# Patient Record
Sex: Male | Born: 1955 | Race: White | Hispanic: No | Marital: Married | Smoking: Never smoker
Health system: Southern US, Community
[De-identification: ages and names within clinical notes are randomized; demographics above are authoritative.]

## PROBLEM LIST (undated history)

## (undated) DIAGNOSIS — G4730 Sleep apnea, unspecified: Secondary | ICD-10-CM

## (undated) DIAGNOSIS — M1990 Unspecified osteoarthritis, unspecified site: Secondary | ICD-10-CM

## (undated) DIAGNOSIS — I719 Aortic aneurysm of unspecified site, without rupture: Secondary | ICD-10-CM

## (undated) DIAGNOSIS — T7840XA Allergy, unspecified, initial encounter: Secondary | ICD-10-CM

## (undated) DIAGNOSIS — K219 Gastro-esophageal reflux disease without esophagitis: Secondary | ICD-10-CM

## (undated) DIAGNOSIS — H269 Unspecified cataract: Secondary | ICD-10-CM

## (undated) DIAGNOSIS — Z9889 Other specified postprocedural states: Secondary | ICD-10-CM

## (undated) DIAGNOSIS — Z8719 Personal history of other diseases of the digestive system: Secondary | ICD-10-CM

## (undated) HISTORY — PX: COLONOSCOPY: SHX174

## (undated) HISTORY — PX: POLYPECTOMY: SHX149

## (undated) HISTORY — DX: Unspecified osteoarthritis, unspecified site: M19.90

## (undated) HISTORY — DX: Unspecified cataract: H26.9

## (undated) HISTORY — DX: Allergy, unspecified, initial encounter: T78.40XA

## (undated) HISTORY — DX: Sleep apnea, unspecified: G47.30

## (undated) HISTORY — PX: CLOSED REDUCTION HAND FRACTURE: SHX973

---

## 1999-05-26 DIAGNOSIS — Z8719 Personal history of other diseases of the digestive system: Secondary | ICD-10-CM

## 1999-05-26 HISTORY — DX: Personal history of other diseases of the digestive system: Z87.19

## 2002-05-25 HISTORY — PX: NISSEN FUNDOPLICATION: SHX2091

## 2018-01-11 DIAGNOSIS — I719 Aortic aneurysm of unspecified site, without rupture: Secondary | ICD-10-CM

## 2018-01-11 DIAGNOSIS — I7121 Aneurysm of the ascending aorta, without rupture: Secondary | ICD-10-CM

## 2018-01-11 DIAGNOSIS — Q2543 Congenital aneurysm of aorta: Secondary | ICD-10-CM

## 2018-01-11 HISTORY — DX: Aneurysm of the ascending aorta, without rupture: I71.21

## 2018-01-11 HISTORY — DX: Aortic aneurysm of unspecified site, without rupture: I71.9

## 2018-01-11 HISTORY — DX: Congenital aneurysm of aorta: Q25.43

## 2018-01-25 ENCOUNTER — Observation Stay (HOSPITAL_COMMUNITY)
Admission: EM | Admit: 2018-01-25 | Discharge: 2018-01-27 | DRG: 287 | Disposition: A | Discharge status: Home / Self Care | Payer: BLUE CROSS/BLUE SHIELD | Attending: Internal Medicine | Admitting: Internal Medicine

## 2018-01-25 ENCOUNTER — Encounter (HOSPITAL_COMMUNITY): Payer: Self-pay | Admitting: Emergency Medicine

## 2018-01-25 ENCOUNTER — Emergency Department (HOSPITAL_COMMUNITY): Payer: BLUE CROSS/BLUE SHIELD

## 2018-01-25 DIAGNOSIS — R079 Chest pain, unspecified: Secondary | ICD-10-CM | POA: Diagnosis present

## 2018-01-25 DIAGNOSIS — Z8249 Family history of ischemic heart disease and other diseases of the circulatory system: Secondary | ICD-10-CM

## 2018-01-25 DIAGNOSIS — G4733 Obstructive sleep apnea (adult) (pediatric): Secondary | ICD-10-CM | POA: Diagnosis present

## 2018-01-25 DIAGNOSIS — Z820 Family history of epilepsy and other diseases of the nervous system: Secondary | ICD-10-CM | POA: Diagnosis not present

## 2018-01-25 DIAGNOSIS — Q2543 Congenital aneurysm of aorta: Secondary | ICD-10-CM | POA: Diagnosis not present

## 2018-01-25 DIAGNOSIS — I4519 Other right bundle-branch block: Secondary | ICD-10-CM | POA: Diagnosis present

## 2018-01-25 DIAGNOSIS — R402252 Coma scale, best verbal response, oriented, at arrival to emergency department: Secondary | ICD-10-CM | POA: Diagnosis present

## 2018-01-25 DIAGNOSIS — K219 Gastro-esophageal reflux disease without esophagitis: Secondary | ICD-10-CM | POA: Diagnosis present

## 2018-01-25 DIAGNOSIS — I25110 Atherosclerotic heart disease of native coronary artery with unstable angina pectoris: Secondary | ICD-10-CM | POA: Diagnosis not present

## 2018-01-25 DIAGNOSIS — R402362 Coma scale, best motor response, obeys commands, at arrival to emergency department: Secondary | ICD-10-CM | POA: Diagnosis present

## 2018-01-25 DIAGNOSIS — I440 Atrioventricular block, first degree: Secondary | ICD-10-CM | POA: Diagnosis not present

## 2018-01-25 DIAGNOSIS — R402142 Coma scale, eyes open, spontaneous, at arrival to emergency department: Secondary | ICD-10-CM | POA: Diagnosis not present

## 2018-01-25 DIAGNOSIS — Z9989 Dependence on other enabling machines and devices: Secondary | ICD-10-CM | POA: Diagnosis not present

## 2018-01-25 DIAGNOSIS — I340 Nonrheumatic mitral (valve) insufficiency: Secondary | ICD-10-CM | POA: Diagnosis not present

## 2018-01-25 DIAGNOSIS — I7121 Aneurysm of the ascending aorta, without rupture: Secondary | ICD-10-CM | POA: Diagnosis present

## 2018-01-25 DIAGNOSIS — I719 Aortic aneurysm of unspecified site, without rupture: Secondary | ICD-10-CM | POA: Diagnosis present

## 2018-01-25 DIAGNOSIS — Z79899 Other long term (current) drug therapy: Secondary | ICD-10-CM | POA: Diagnosis not present

## 2018-01-25 DIAGNOSIS — Z8719 Personal history of other diseases of the digestive system: Secondary | ICD-10-CM

## 2018-01-25 DIAGNOSIS — M109 Gout, unspecified: Secondary | ICD-10-CM | POA: Diagnosis not present

## 2018-01-25 DIAGNOSIS — I712 Thoracic aortic aneurysm, without rupture: Secondary | ICD-10-CM | POA: Diagnosis present

## 2018-01-25 HISTORY — DX: Gastro-esophageal reflux disease without esophagitis: K21.9

## 2018-01-25 HISTORY — DX: Other specified postprocedural states: Z98.890

## 2018-01-25 HISTORY — DX: Aortic aneurysm of unspecified site, without rupture: I71.9

## 2018-01-25 HISTORY — DX: Personal history of other diseases of the digestive system: Z87.19

## 2018-01-25 LAB — CBC
HCT: 49.8 % (ref 39.0–52.0)
Hemoglobin: 16.6 g/dL (ref 13.0–17.0)
MCH: 29.5 pg (ref 26.0–34.0)
MCHC: 33.3 g/dL (ref 30.0–36.0)
MCV: 88.6 fL (ref 78.0–100.0)
PLATELETS: 163 10*3/uL (ref 150–400)
RBC: 5.62 MIL/uL (ref 4.22–5.81)
RDW: 13.3 % (ref 11.5–15.5)
WBC: 7.8 10*3/uL (ref 4.0–10.5)

## 2018-01-25 LAB — BASIC METABOLIC PANEL
ANION GAP: 11 (ref 5–15)
BUN: 13 mg/dL (ref 8–23)
CHLORIDE: 103 mmol/L (ref 98–111)
CO2: 27 mmol/L (ref 22–32)
Calcium: 10.1 mg/dL (ref 8.9–10.3)
Creatinine, Ser: 1.19 mg/dL (ref 0.61–1.24)
GFR calc Af Amer: >60 mL/min (ref >60)
GFR calc non Af Amer: >60 mL/min (ref >60)
Glucose, Bld: 111 mg/dL — ABNORMAL HIGH (ref 70–99)
POTASSIUM: 4.2 mmol/L (ref 3.5–5.1)
SODIUM: 141 mmol/L (ref 135–145)

## 2018-01-25 LAB — I-STAT TROPONIN, ED
Troponin i, poc: 0.02 ng/mL (ref 0.00–0.08)
Troponin i, poc: 0.01 ng/mL (ref 0.00–0.08)

## 2018-01-25 MED ORDER — ASPIRIN 81 MG PO CHEW
324.0000 mg | CHEWABLE_TABLET | Freq: Once | ORAL | Status: AC
  Administered 2018-01-25: 324 mg via ORAL
  Filled 2018-01-25: qty 4

## 2018-01-25 NOTE — ED Triage Notes (Signed)
Patient to ED c/o intermittent chest heaviness/pressure x [redacted] weeks along with dyspnea on exertion. 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 Patient was recently started on metoprolol 25mg  PO bid 2-3 days ago. Denies shortness of breath at this time, resp e/u, skin warm/dry.

## 2018-01-25 NOTE — ED Provider Notes (Signed)
[facility] EMERGENCY DEPARTMENT Provider Note   CSN: [id number] Arrival date & time: 01/25/18  1553     History   Chief Complaint Chief Complaint  Patient presents with  . Chest Pain    HPI Marvin Harvey is a 62 y.o. male.  HPI Patient is coming from [place].  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  He reports his physician had told him that if he needed repair, it would be arranged with tertiary care facility.  Patient has been having exertional chest pain and shortness of breath. His cardiologist planning a future cath. In past 4 weeks increasing frequency and severity of CP and SOB especially with exertion. Last episode today about 2:00pm. Takes daily aspirin, didn't take today.  No lower extremity swelling or calf pain. Past Medical History:  Diagnosis Date  . 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 GERD (gastroesophageal reflux disease)   . History of repair of hiatal hernia 2001    There are no active problems to display for this patient.   History reviewed. No pertinent surgical history.      Home Medications    Prior to Admission medications   Medication Sig Start Date End Date Taking? Authorizing Provider  allopurinol (ZYLOPRIM) 300 MG tablet Take 450 mg by mouth every evening.  11/01/17  Yes [provider]  metoprolol tartrate (LOPRESSOR) 25 MG tablet Take 25 mg by mouth 2 (two) times daily. 01/21/18  Yes [provider]    Family History No family history on file.  Social History Social History   Tobacco Use  . Smoking status: Never Smoker  . Smokeless tobacco: Never Used  Substance Use Topics  . Alcohol use: Never    Frequency: Never  . Drug use: Never     Allergies   Patient has no known allergies.   Review of Systems Review of Systems 10 Systems  reviewed and are negative for acute change except as noted in the HPI.   Physical Exam Updated Vital Signs BP (!) 141/76   Pulse 65   Temp 98.1 F (36.7 C) (Oral)   Resp 18   SpO2 99%   Physical Exam  Constitutional: He is oriented to person, place, and time. He appears well-developed and well-nourished.  HENT:  Head: Normocephalic and atraumatic.  Eyes: Pupils are equal, round, and reactive to light. EOM are normal.  Neck: Neck supple.  Cardiovascular: Normal rate, regular rhythm, normal heart sounds and intact distal pulses.  Pulmonary/Chest: Effort normal and breath sounds normal.  Abdominal: Soft. Bowel sounds are normal. He exhibits no distension. There is no tenderness.  Musculoskeletal: Normal range of motion. He exhibits no edema or tenderness.  Neurological: He is alert and oriented to person, place, and time. He has normal strength. Coordination normal. GCS eye subscore is 4. GCS verbal subscore is 5. GCS motor subscore is 6.  Skin: Skin is warm, dry and intact.  Psychiatric: He has a normal mood and affect.     ED Treatments / Results  Labs (all labs ordered are listed, but only abnormal results are displayed) Labs Reviewed  BASIC METABOLIC PANEL - Abnormal; Notable for the following components:      Result Value   Glucose, Bld 111 (*)    All other components within normal limits  CBC  I-STAT TROPONIN,  ED  I-STAT TROPONIN, ED    EKG EKG Interpretation  Date/Time:  Tuesday January 25 2018 15:56:39 EDT Ventricular Rate:  74 PR Interval:  216 QRS Duration: 108 QT Interval:  376 QTC Calculation: 417 R Axis:   -23 Text Interpretation:  Sinus rhythm with 1st degree A-V block Incomplete right bundle branch block Nonspecific T wave abnormality Abnormal ECG agree. no old comparison Confirmed by Charlesetta Shanks [phone]) on 01/25/2018 9:33:51 PM   Radiology Dg Chest 2 View  Result Date: 01/25/2018 CLINICAL DATA:  Chest heaviness, intermittent pressure over the  last 3 weeks, some shortness of breath and nausea EXAM: CHEST - 2 VIEW COMPARISON:  None. FINDINGS: No active infiltrate or effusion is seen. Mediastinal and hilar contours are unremarkable. The heart may be borderline enlarged. There is a small hiatal hernia noted. No acute bony abnormality is seen. IMPRESSION: 1. No active cardiopulmonary disease. 2. Borderline cardiomegaly. 3. Small hiatal hernia. Electronically Signed   By: Ivar Drape M.D.   On: 01/25/2018 16:26    Procedures Procedures (including critical care time)  Medications Ordered in ED Medications  aspirin chewable tablet 324 mg (has no administration in time range)     Initial Impression / Assessment and Plan / ED Course  I have reviewed the triage vital signs and the nursing notes.  Pertinent labs & imaging results that were available during my care of the patient were reviewed by me and considered in my medical decision making (see chart for details).  Clinical Course as of Jan 26 2216  Tue Jan 25, 2018  2153 Consult to cardiology ordered   [MP]  2158 Consult:Cardiology Fellow   [MP]  2200 Cardiology requests repeat troponin and will consult in ED.   [MP]    Clinical Course User Index [MP] Charlesetta Shanks, MD   Patient has been experiencing exertional chest pain and shortness of breath.  He reports the frequency and severity has increased in the past several weeks.  His medical records are in [place].  EKG has nonspecific ST changes without old comparison available at this time.  First set of cardiac enzymes are negative.  Consult has been placed to cardiology for evaluation in the emergency department for definitive management.  Final Clinical Impressions(s) / ED Diagnoses   Final diagnoses:  Chest pain, unspecified type    ED Discharge Orders    None       Charlesetta Shanks, MD 01/25/18 2218

## 2018-01-25 NOTE — ED Notes (Signed)
ED Provider at bedside. 

## 2018-01-25 NOTE — Consult Note (Signed)
Cardiology Consultation Note    Patient ID: Marvin Harvey, MRN: [id number], DOB/AGE: 1955-10-29 62 y.o. Admit date: 01/25/2018   Date of Consult: 01/25/2018 Primary Physician: No primary care provider on file. Primary Cardiologist: [facility] cardiology  Chief Complaint: Chest Pain Reason for Consultation: Rule out ischemia Requesting MD: Johnney Killian  HPI: Marvin Harvey is a 62 y.o. male with a history of aortic root aneurysm who presents with chest pain.   The patient had chest pain in the morning and has had a known hx of aortic room aneurysm. The patient is seen by [facility] cardiology though and plan was to do ischemic eval at some point. First set of enzymes wnl but EKG has concerning signs including ST elevation in V and V2 though also has RBBB. Chest pain free when contacted.   Past Medical History:  Diagnosis Date  . 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 GERD (gastroesophageal reflux disease)   . History of repair of hiatal hernia 2001      Surgical History: History reviewed. No pertinent surgical history.   Home Meds: Prior to Admission medications   Medication Sig Start Date End Date Taking? Authorizing Provider  allopurinol (ZYLOPRIM) 300 MG tablet Take 450 mg by mouth every evening.  11/01/17  Yes [provider]  metoprolol tartrate (LOPRESSOR) 25 MG tablet Take 25 mg by mouth 2 (two) times daily. 01/21/18  Yes [provider]    Inpatient Medications:  . aspirin  324 mg Oral Once    Allergies: No Known Allergies  Social History   Socioeconomic History  . Marital status: Married    Spouse name: Not on file  . Number of children: Not on file  . Years of education: Not on file  . Highest education level: Not on file  Occupational History  . Not on file  Social Needs  . Financial resource strain: Not on file  . Food insecurity:    Worry: Not on file    Inability: Not on file  . Transportation needs:    Medical: Not  on file    Non-medical: Not on file  Tobacco Use  . Smoking status: Never Smoker  . Smokeless tobacco: Never Used  Substance and Sexual Activity  . Alcohol use: Never    Frequency: Never  . Drug use: Never  . Sexual activity: Not on file  Lifestyle  . Physical activity:    Days per week: Not on file    Minutes per session: Not on file  . Stress: Not on file  Relationships  . Social connections:    Talks on phone: Not on file    Gets together: Not on file    Attends religious service: Not on file    Active member of club or organization: Not on file    Attends meetings of clubs or organizations: Not on file    Relationship status: Not on file  . Intimate partner violence:    Fear of current or ex partner: Not on file    Emotionally abused: Not on file    Physically abused: Not on file    Forced sexual activity: Not on file  Other Topics Concern  . Not on file  Social History Narrative  . Not on file     No family history on file.   Review of Systems: General: negative for chills, fever, night sweats or weight changes.  Cardiovascular: negative for chest pain, edema, orthopnea, palpitations, paroxysmal  nocturnal dyspnea, shortness of breath or dyspnea on exertion Dermatological: negative for rash Respiratory: negative for cough or wheezing Urologic: negative for hematuria Abdominal: negative for nausea, vomiting, diarrhea, bright red blood per rectum, melena, or hematemesis Neurologic: negative for visual changes, syncope, or dizziness All other systems reviewed and are otherwise negative except as noted above.  Labs: No results for input(s): CKTOTAL, CKMB, TROPONINI in the last 72 hours. Lab Results  Component Value Date   WBC 7.8 01/25/2018   HGB 16.6 01/25/2018   HCT 49.8 01/25/2018   MCV 88.6 01/25/2018   PLT 163 01/25/2018    Recent Labs  Lab 01/25/18 1607  NA 141  K 4.2  CL 103  CO2 27  BUN 13  CREATININE 1.19  CALCIUM 10.1  GLUCOSE 111*   No  results found for: CHOL, HDL, LDLCALC, TRIG No results found for: DDIMER  Radiology/Studies:  Dg Chest 2 View  Result Date: 01/25/2018 CLINICAL DATA:  Chest heaviness, intermittent pressure over the last 3 weeks, some shortness of breath and nausea EXAM: CHEST - 2 VIEW COMPARISON:  None. FINDINGS: No active infiltrate or effusion is seen. Mediastinal and hilar contours are unremarkable. The heart may be borderline enlarged. There is a small hiatal hernia noted. No acute bony abnormality is seen. IMPRESSION: 1. No active cardiopulmonary disease. 2. Borderline cardiomegaly. 3. Small hiatal hernia. Electronically Signed   By: Ivar Drape M.D.   On: 01/25/2018 16:26    Wt Readings from Last 3 Encounters:  No data found for Wt     Physical Exam: Blood pressure (!) 141/76, pulse 65, temperature 98.1 F (36.7 C), temperature source Oral, resp. rate 18, SpO2 99 %. There is no height or weight on file to calculate BMI. General: Well developed, well nourished, in no acute distress. Head: Normocephalic, atraumatic, sclera non-icteric, no xanthomas, nares are without discharge.  Neck: Negative for carotid bruits. JVD not elevated. Lungs: Clear bilaterally to auscultation without wheezes, rales, or rhonchi. Breathing is unlabored. Heart: RRR with S1 S2. No murmurs, rubs, or gallops appreciated. Abdomen: Soft, non-tender, non-distended with normoactive bowel sounds. No hepatomegaly. No rebound/guarding. No obvious abdominal masses. Msk:  Strength and tone appear normal for age. Extremities: No clubbing or cyanosis. No edema.  Distal pedal pulses are 2+ and equal bilaterally. Neuro: Alert and oriented X 3. No facial asymmetry. No focal deficit. Moves all extremities spontaneously. Psych:  Responds to questions appropriately with a normal affect.     Assessment and Plan  Diagnostics: -telemetry overnight -TTE in am -LHC on Monday or sooner if symptoms progress -daily ECG -troponin in am to  estimate size of MI Therapeutic: -ASA 325 given, starting 81 daily -Heparin gtt -Atorva 80 -Metoprolol 12.5 BID -will await LHC results prior to initiation of P2Y12 -SL NTG prn chest pain   Signed, Glynda Jaeger, MD 01/25/2018, 10:01 PM

## 2018-01-26 ENCOUNTER — Encounter (HOSPITAL_COMMUNITY)
Admission: EM | Disposition: A | Discharge status: Home / Self Care | Payer: Self-pay | Source: Home / Self Care | Attending: Emergency Medicine

## 2018-01-26 ENCOUNTER — Inpatient Hospital Stay (HOSPITAL_COMMUNITY): Payer: BLUE CROSS/BLUE SHIELD

## 2018-01-26 ENCOUNTER — Other Ambulatory Visit: Payer: Self-pay

## 2018-01-26 ENCOUNTER — Encounter (HOSPITAL_COMMUNITY): Payer: Self-pay | Admitting: Family Medicine

## 2018-01-26 DIAGNOSIS — R079 Chest pain, unspecified: Secondary | ICD-10-CM | POA: Diagnosis present

## 2018-01-26 DIAGNOSIS — I440 Atrioventricular block, first degree: Secondary | ICD-10-CM | POA: Diagnosis not present

## 2018-01-26 DIAGNOSIS — I200 Unstable angina: Secondary | ICD-10-CM

## 2018-01-26 DIAGNOSIS — Q2543 Congenital aneurysm of aorta: Secondary | ICD-10-CM | POA: Diagnosis not present

## 2018-01-26 DIAGNOSIS — I25110 Atherosclerotic heart disease of native coronary artery with unstable angina pectoris: Secondary | ICD-10-CM | POA: Diagnosis not present

## 2018-01-26 DIAGNOSIS — I712 Thoracic aortic aneurysm, without rupture: Secondary | ICD-10-CM | POA: Diagnosis not present

## 2018-01-26 DIAGNOSIS — I719 Aortic aneurysm of unspecified site, without rupture: Secondary | ICD-10-CM | POA: Diagnosis not present

## 2018-01-26 HISTORY — PX: LEFT HEART CATH AND CORS/GRAFTS ANGIOGRAPHY: CATH118250

## 2018-01-26 LAB — ECHOCARDIOGRAM COMPLETE
HEIGHTINCHES: 71 in
Weight: 3323.2 oz

## 2018-01-26 LAB — HIV ANTIBODY (ROUTINE TESTING W REFLEX): HIV SCREEN 4TH GENERATION: NONREACTIVE

## 2018-01-26 LAB — PROTIME-INR
INR: 1.2
Prothrombin Time: 15.1 seconds (ref 11.4–15.2)

## 2018-01-26 LAB — TROPONIN I: Troponin I: <0.03 ng/mL (ref <0.03)

## 2018-01-26 LAB — HEPARIN LEVEL (UNFRACTIONATED): HEPARIN UNFRACTIONATED: 0.88 [IU]/mL — AB (ref 0.30–0.70)

## 2018-01-26 SURGERY — LEFT HEART CATH AND CORS/GRAFTS ANGIOGRAPHY
Anesthesia: LOCAL

## 2018-01-26 MED ORDER — HEPARIN BOLUS VIA INFUSION
4000.0000 [IU] | Freq: Once | INTRAVENOUS | Status: AC
  Administered 2018-01-26: 4000 [IU] via INTRAVENOUS
  Filled 2018-01-26: qty 4000

## 2018-01-26 MED ORDER — ALLOPURINOL 300 MG PO TABS
450.0000 mg | ORAL_TABLET | Freq: Every evening | ORAL | Status: DC
  Administered 2018-01-26: 450 mg via ORAL
  Filled 2018-01-26: qty 2

## 2018-01-26 MED ORDER — ONDANSETRON HCL 4 MG/2ML IJ SOLN: 4.0000 mg | Freq: Four times a day (QID) | INTRAMUSCULAR | Status: DC | PRN

## 2018-01-26 MED ORDER — SODIUM CHLORIDE 0.9% FLUSH: 3.0000 mL | INTRAVENOUS | Status: DC | PRN

## 2018-01-26 MED ORDER — ASPIRIN EC 81 MG PO TBEC
81.0000 mg | DELAYED_RELEASE_TABLET | Freq: Once | ORAL | Status: AC
  Administered 2018-01-26: 81 mg via ORAL
  Filled 2018-01-26: qty 1

## 2018-01-26 MED ORDER — FENTANYL CITRATE (PF) 100 MCG/2ML IJ SOLN
INTRAMUSCULAR | Status: AC
  Filled 2018-01-26: qty 2

## 2018-01-26 MED ORDER — ACETAMINOPHEN 325 MG PO TABS: 650.0000 mg | ORAL_TABLET | ORAL | Status: DC | PRN

## 2018-01-26 MED ORDER — FENTANYL CITRATE (PF) 100 MCG/2ML IJ SOLN
INTRAMUSCULAR | Status: DC | PRN
  Administered 2018-01-26: 25 ug via INTRAVENOUS

## 2018-01-26 MED ORDER — HEPARIN (PORCINE) IN NACL 100-0.45 UNIT/ML-% IJ SOLN
1000.0000 [IU]/h | INTRAMUSCULAR | Status: DC
  Administered 2018-01-26: 1150 [IU]/h via INTRAVENOUS
  Filled 2018-01-26: qty 250

## 2018-01-26 MED ORDER — ASPIRIN EC 81 MG PO TBEC
81.0000 mg | DELAYED_RELEASE_TABLET | Freq: Every day | ORAL | Status: DC
  Administered 2018-01-27: 81 mg via ORAL
  Filled 2018-01-26: qty 1

## 2018-01-26 MED ORDER — HEPARIN (PORCINE) IN NACL 1000-0.9 UT/500ML-% IV SOLN
INTRAVENOUS | Status: AC
  Filled 2018-01-26: qty 1000

## 2018-01-26 MED ORDER — LIDOCAINE HCL (PF) 1 % IJ SOLN
INTRAMUSCULAR | Status: AC
  Filled 2018-01-26: qty 30

## 2018-01-26 MED ORDER — ASPIRIN EC 81 MG PO TBEC: 81.0000 mg | DELAYED_RELEASE_TABLET | Freq: Every day | ORAL | Status: DC

## 2018-01-26 MED ORDER — ASPIRIN 81 MG PO CHEW: 81.0000 mg | CHEWABLE_TABLET | Freq: Every day | ORAL | Status: DC

## 2018-01-26 MED ORDER — ASPIRIN 81 MG PO CHEW: 81.0000 mg | CHEWABLE_TABLET | ORAL | Status: DC

## 2018-01-26 MED ORDER — LIDOCAINE HCL (PF) 1 % IJ SOLN
INTRAMUSCULAR | Status: DC | PRN
  Administered 2018-01-26: 25 mL

## 2018-01-26 MED ORDER — SODIUM CHLORIDE 0.9% FLUSH
3.0000 mL | Freq: Two times a day (BID) | INTRAVENOUS | Status: DC
  Administered 2018-01-26 – 2018-01-27 (×2): 3 mL via INTRAVENOUS

## 2018-01-26 MED ORDER — MIDAZOLAM HCL 2 MG/2ML IJ SOLN
INTRAMUSCULAR | Status: DC | PRN
  Administered 2018-01-26: 1 mg via INTRAVENOUS

## 2018-01-26 MED ORDER — SODIUM CHLORIDE 0.9 % WEIGHT BASED INFUSION: 1.0000 mL/kg/h | INTRAVENOUS | Status: DC

## 2018-01-26 MED ORDER — IOHEXOL 350 MG/ML SOLN
INTRAVENOUS | Status: DC | PRN
  Administered 2018-01-26: 65 mL via INTRA_ARTERIAL

## 2018-01-26 MED ORDER — MORPHINE SULFATE (PF) 2 MG/ML IV SOLN: 2.0000 mg | INTRAVENOUS | Status: DC | PRN

## 2018-01-26 MED ORDER — SODIUM CHLORIDE 0.9 % WEIGHT BASED INFUSION: 3.0000 mL/kg/h | INTRAVENOUS | Status: DC

## 2018-01-26 MED ORDER — SODIUM CHLORIDE 0.9 % IV SOLN: INTRAVENOUS | Status: AC

## 2018-01-26 MED ORDER — SODIUM CHLORIDE 0.9 % IV SOLN: 250.0000 mL | INTRAVENOUS | Status: DC | PRN

## 2018-01-26 MED ORDER — SODIUM CHLORIDE 0.9 % WEIGHT BASED INFUSION
3.0000 mL/kg/h | INTRAVENOUS | Status: DC
  Administered 2018-01-26: 3 mL/kg/h via INTRAVENOUS

## 2018-01-26 MED ORDER — HEPARIN (PORCINE) IN NACL 1000-0.9 UT/500ML-% IV SOLN
INTRAVENOUS | Status: DC | PRN
  Administered 2018-01-26 (×2): 500 mL

## 2018-01-26 MED ORDER — SODIUM CHLORIDE 0.9 % IV SOLN
INTRAVENOUS | Status: AC
  Administered 2018-01-26: 05:00:00 via INTRAVENOUS

## 2018-01-26 MED ORDER — NITROGLYCERIN 0.4 MG SL SUBL: 0.4000 mg | SUBLINGUAL_TABLET | SUBLINGUAL | Status: DC | PRN

## 2018-01-26 MED ORDER — METOPROLOL TARTRATE 12.5 MG HALF TABLET
12.5000 mg | ORAL_TABLET | Freq: Two times a day (BID) | ORAL | Status: DC
  Administered 2018-01-26 – 2018-01-27 (×3): 12.5 mg via ORAL
  Filled 2018-01-26 (×3): qty 1

## 2018-01-26 MED ORDER — MIDAZOLAM HCL 2 MG/2ML IJ SOLN
INTRAMUSCULAR | Status: AC
  Filled 2018-01-26: qty 2

## 2018-01-26 MED ORDER — SODIUM CHLORIDE 0.9% FLUSH: 3.0000 mL | Freq: Two times a day (BID) | INTRAVENOUS | Status: DC

## 2018-01-26 MED ORDER — ATORVASTATIN CALCIUM 80 MG PO TABS
80.0000 mg | ORAL_TABLET | Freq: Every day | ORAL | Status: DC
  Administered 2018-01-26: 80 mg via ORAL
  Filled 2018-01-26 (×3): qty 1

## 2018-01-26 SURGICAL SUPPLY — 9 items
CATH INFINITI 5FR MULTPACK ANG (CATHETERS) ×2 IMPLANT
DEVICE CLOSURE MYNXGRIP 5F (Vascular Products) ×2 IMPLANT
KIT HEART LEFT (KITS) ×2 IMPLANT
PACK CARDIAC CATHETERIZATION (CUSTOM PROCEDURE TRAY) ×2 IMPLANT
SHEATH PINNACLE 5F 10CM (SHEATH) ×2 IMPLANT
SYR MEDRAD MARK V 150ML (SYRINGE) ×2 IMPLANT
TRANSDUCER W/STOPCOCK (MISCELLANEOUS) ×2 IMPLANT
TUBING CIL FLEX 10 FLL-RA (TUBING) ×2 IMPLANT
WIRE EMERALD 3MM-J .035X150CM (WIRE) ×2 IMPLANT

## 2018-01-26 NOTE — Consult Note (Addendum)
Cardiology Consultation:   Patient ID: Marvin Harvey; [id number]; [birth year]   Admit date: 01/25/2018 Date of Consult: 01/26/2018  Primary Care Provider: Sherrilee Gilles, DO Primary Cardiologist: New to [facility] Followed in [place]  Patient Profile:   Marvin Harvey is a 62 y.o. male with a hx of aortic root aneurysm which is followed in [place], h/o mitral regurgitation, OSA on CPAP, family history of CAD, PVD and valvular heart disease (father) as well as history of GERD, hiatal hernia s/p repair and Gout who is being seen today for the evaluation of chest pain at the request of Dr. Broadus John, Internal Medicine.   History of Present Illness:   Marvin Harvey lives in [place] and is a Company secretary. His daughter is a PA and works in Teacher, English as a foreign language in [place]. His cardiac risk factors only include a family history of heart disease. His father had CAD, PVD and valvular heart disease and underwent triple coronary artery bypass + aortic aneurysm repair and valve replacement, in his 11s. His mother had history of atrial fibrillation and TIAs. He denies any personally history of HTN, HLD, diabetes and no tobacco use. His medical problems include GERD and hiatal hernia s/p surgical repair ~15 years ago. He has Gout, on prophylactic treatment with Allopurinol. About 10 years ago, he was diagnosed with OSA and has since been on CPAP therapy and reports full compliance. At the time of diagnosis, he was also noted to have a murmur and was told that he had mitral regurgitation. It appeared that he was also noted to have mild aortic root dilatation and recommendations were made to follow with surveillance echocardiograms every 2 years. His most recent echo showed progression in the size of his aortic root, leading to a chest CT, which was done 2 weeks ago. Pt was told that his aortic root measured 5.2 cm. A TEE was also done last week to better assess the mitral valve. However the patient reports that he was not given any  specific details regarding the result of ultrasound. His cardiologist in [place] recommended a diagnostic LHC, scheduled this coming Friday, however the patient reports that he preferred to have w/u done at [facility].  Pt presented to [facility] ED last night, 01/25/18, with CC of chest pain. Pt notes progressive CP and dyspnea, that has worsened over the last several weeks.  He has had combination of resting CP, nocturnal CP awakening him from his sleep and exertional CP. No postprandial symptoms and does not feel like reflux. CP is located in the left chest but does not radiate. Feels like pressure. Episodes last 5-30 min at a time. Resolves spontaneously.  He also noted exertional dyspnea walking up stairs. No resting dyspnea. Denies LEE. Given his progressive symptoms, he decided to come to [facility] for further evaluation.   POC troponin in the ED negative x 2. Actual lab troponin I negative x 1. CBC and BMP WNL. Admit EKG showed NSR with HR 76 bpm, however repeat EKG this morning shows sinus bradycardia with 1st degree AVB and HR at 46 bpm.  CXR notable for no active cardiopulmonary disease. Borderline cardiomegaly noted as well as a small hiatal hernia. 2D echo pending. BP is well controlled in the 245Y systolic. HR in the upper 40s - mid 50s. He is on BB therapy with low dose metoprolol, 12.5 mg BID. He is currently CP free.   Past Medical History:  Diagnosis Date  . Aortic root aneurysm ([place]) 01/11/2018   5.2cm according to CT scan  .  GERD (gastroesophageal reflux disease)   . History of repair of hiatal hernia 2001    History reviewed. No pertinent surgical history.   Home Medications:  Prior to Admission medications   Medication Sig Start Date End Date Taking? Authorizing Provider  allopurinol (ZYLOPRIM) 300 MG tablet Take 450 mg by mouth every evening.  11/01/17  Yes [provider]  metoprolol tartrate (LOPRESSOR) 25 MG tablet Take 25 mg by mouth 2 (two) times daily. 01/21/18  Yes [provider]    Inpatient Medications: Scheduled Meds: . allopurinol  450 mg Oral QPM  . [START ON 01/27/2018] aspirin EC  81 mg Oral Daily  . atorvastatin  80 mg Oral q1800  . metoprolol tartrate  12.5 mg Oral BID   Continuous Infusions: . sodium chloride 75 mL/hr at 01/26/18 0511  . heparin 1,150 Units/hr (01/26/18 0515)   PRN Meds: acetaminophen, nitroGLYCERIN, ondansetron (ZOFRAN) IV  Allergies:   No Known Allergies  Social History:   Social History   Socioeconomic History  . Marital status: Married    Spouse name: Not on file  . Number of children: Not on file  . Years of education: Not on file  . Highest education level: Not on file  Occupational History  . Not on file  Social Needs  . Financial resource strain: Not on file  . Food insecurity:    Worry: Not on file    Inability: Not on file  . Transportation needs:    Medical: Not on file    Non-medical: Not on file  Tobacco Use  . Smoking status: Never Smoker  . Smokeless tobacco: Never Used  Substance and Sexual Activity  . Alcohol use: Never    Frequency: Never  . Drug use: Never  . Sexual activity: Not on file  Lifestyle  . Physical activity:    Days per week: Not on file    Minutes per session: Not on file  . Stress: Not on file  Relationships  . Social connections:    Talks on phone: Not on file    Gets together: Not on file    Attends religious service: Not on file    Active member of club or organization: Not on file    Attends meetings of clubs or organizations: Not on file    Relationship status: Not on file  . Intimate partner violence:    Fear of current or ex partner: Not on file    Emotionally abused: Not on file    Physically abused: Not on file    Forced sexual activity: Not on file  Other Topics Concern  . Not on file  Social History Narrative  . Not on file    Family History:    Family History  Problem Relation Age of Onset  . Coronary artery disease Father        s/p  CABGx3, AAA repair, Valve replacement  . Atrial fibrillation Mother   . Transient ischemic attack Mother      ROS:  Please see the history of present illness.   All other ROS reviewed and negative.     Physical Exam/Data:   Vitals:   01/26/18 0313 01/26/18 0315 01/26/18 0352 01/26/18 0722  BP: 102/60 102/61 128/87 117/79  Pulse:   (!) 56 (!) 51  Resp:    16  Temp:   97.7 F (36.5 C) 97.6 F (36.4 C)  TempSrc:   Oral Oral  SpO2: 100% 100% 100% 99%  Weight:  94.2 kg   Height:   5\' 11"  (1.803 m)     Intake/Output Summary (Last 24 hours) at 01/26/2018 1033 Last data filed at 01/26/2018 0620 Gross per 24 hour  Intake 137.33 ml  Output -  Net 137.33 ml   Filed Weights   01/26/18 0352  Weight: 94.2 kg   Body mass index is 28.97 kg/m.  General:  Well nourished, well developed, in no acute distress HEENT: normal Lymph: no adenopathy Neck: no JVD Endocrine:  No thryomegaly Vascular: No carotid bruits; FA pulses 2+ bilaterally without bruits  Cardiac:  normal S1, S2; regular rhythm, bradycardia no murmur  Lungs:  clear to auscultation bilaterally, no wheezing, rhonchi or rales  Abd: soft, nontender, no hepatomegaly  Ext: no edema Musculoskeletal:  No deformities, BUE and BLE strength normal and equal Skin: warm and dry  Neuro:  CNs 2-12 intact, no focal abnormalities noted Psych:  Normal affect   EKG:  The EKG was personally reviewed and demonstrates:  Sinus bradycardia w/ 1st degree AVB, 48 bpm Telemetry:  Telemetry was personally reviewed and demonstrates:  Sinus bradycardia  Relevant CV Studies: None  2D Echo pending  Laboratory Data:  Chemistry Recent Labs  Lab 01/25/18 1607  NA 141  K 4.2  CL 103  CO2 27  GLUCOSE 111*  BUN 13  CREATININE 1.19  CALCIUM 10.1  GFRNONAA >60  GFRAA >60  ANIONGAP 11    No results for input(s): PROT, ALBUMIN, AST, ALT, ALKPHOS, BILITOT in the last 168 hours. Hematology Recent Labs  Lab 01/25/18 1607  WBC 7.8  RBC  5.62  HGB 16.6  HCT 49.8  MCV 88.6  MCH 29.5  MCHC 33.3  RDW 13.3  PLT 163   Cardiac Enzymes Recent Labs  Lab 01/26/18 0446  TROPONINI <0.03    Recent Labs  Lab 01/25/18 1617 01/25/18 2232  TROPIPOC 0.02 0.01    BNPNo results for input(s): BNP, PROBNP in the last 168 hours.  DDimer No results for input(s): DDIMER in the last 168 hours.  Radiology/Studies:  Dg Chest 2 View  Result Date: 01/25/2018 CLINICAL DATA:  Chest heaviness, intermittent pressure over the last 3 weeks, some shortness of breath and nausea EXAM: CHEST - 2 VIEW COMPARISON:  None. FINDINGS: No active infiltrate or effusion is seen. Mediastinal and hilar contours are unremarkable. The heart may be borderline enlarged. There is a small hiatal hernia noted. No acute bony abnormality is seen. IMPRESSION: 1. No active cardiopulmonary disease. 2. Borderline cardiomegaly. 3. Small hiatal hernia. Electronically Signed   By: Ivar Drape M.D.   On: 01/25/2018 16:26    Assessment and Plan:   Tyeson Tanimoto is a 62 y.o. male with a hx of aortic root aneurysm which is followed in [place], h/o mitral regurgitation, OSA on CPAP, family history of CAD, PVD and valvular heart disease (father) as well as history of GERD, hiatal hernia s/p repair and Gout who is being seen today for the evaluation of chest pain at the request of Dr. Broadus John, Internal Medicine.   1. Chest Pain: based on symptom characteristics his CP is highly concerning for cardiac etiology/ unstable angina. He has known PVD with thoracic AA, measuring at 5.2 cm on recent chest CT done in [place]. He also has significant family history of heart disease. His father had CAD, PVD and valvular heart disease and underwent triple coronary artery bypass + aortic aneurysm repair and valve replacement, in his 59s. He is currently CP free and cardiac  enzymes negative x 3. Recommend either diagnostic LHC vs coronary CTA to define coronary anatomy. His resting HR is in the upper  40s-mid 50s, thus a good candidate for coronary CTA. Will defer to MD.   2. Aortic Aneurysm: per pt, recent chest CT at outside hospital showed thoracic aortic aneurysm, measuring at 5.2 cm. I have discussed with primary attending Dr. Broadus John, who will request cardiology records including CT report be sent to [facility] for review. He will need referral to vascular surgery for close surveillance/ surgical repair. His BP is well controlled and HR <50 on BB. Will continue at current dose.   3. ?Mitral Valve Disease: pt reports being told in the past of MR. However no notable murmurs on exam. Echocardiogram has been completed. Read pending. Will follow.    4. Sinus Bradycardia: resting HR in the upper 40s-50s, but on low dose BB, which was recently added by primary cardiologist given AA. He denies syncope/ near syncope and dizziness. Metoprolol home dose of 25 mg BID has been weaned down to 12.5 mg BID. Will continue to monitor.   5. OSA: continue w/ CPAP QHS.   6. GERD/Hital Hernia: s/p surgical repair of hiatal hernia ~15 years ago. Pt denies any postprandial CP/ indigestion.   7. Gout: stable. No acute flare. Continue Allopurinol for prophylaxis.   8. Other Screening: recommend FLP for baseline assessment of lipids. Consider Hgb A1c to screen for DM.    MD to follow with further recommendations.    For questions or updates, please contact [place] Please consult [email or web address] for contact info under Cardiology/STEMI.   Signed, Marvin Jester, PA-C  01/26/2018 10:33 AM   Agree with note written by Ellen Henri  PAC  62 year old married Caucasian male from [place] admitted with unstable angina.  He has no risk factors other than family history the father who had a thoracic aortic aneurysm, and CAD status post root repair, aortic valve replacement and CABG.  He is been followed by cardiologist in [place] with every other year 2D echoes which apparently most recently showed a generous  aortic root and mitral regurgitation.  TEE confirmed this and a CTA showed a thoracic aorta measuring 5.2 cm.  He presented here with unstable angina.  Currently pain-free on IV heparin.  His labs are within normal limits.  His exam is benign.  Plan is for diagnostic coronary angiography via the right femoral approach. The patient understands that risks included but are not limited to stroke (1 in 1000), death (1 in 45), kidney failure [usually temporary] (1 in 500), bleeding (1 in 200), allergic reaction [possibly serious] (1 in 200). The patient understands and agrees to proceed   Quay Burow 01/26/2018 11:39 AM

## 2018-01-26 NOTE — ED Notes (Signed)
Opyd, MD at bedside 

## 2018-01-26 NOTE — Plan of Care (Signed)
  Problem: Education: Goal: Knowledge of General Education information will improve Description: Including pain rating scale, medication(s)/side effects and non-pharmacologic comfort measures Outcome: Progressing   Problem: Clinical Measurements: Goal: Respiratory complications will improve Outcome: Progressing   

## 2018-01-26 NOTE — H&P (View-Only) (Signed)
Cardiology Consultation:   Patient ID: Marvin Harvey; [id number]; 04-15-1956   Admit date: 01/25/2018 Date of Consult: 01/26/2018  Primary Care Provider: Sherrilee Gilles, DO Primary Cardiologist: New to [facility]. Followed in [place]  Patient Profile:   Marvin Harvey is a 62 y.o. male with a hx of aortic root aneurysm which is followed in [place], h/o mitral regurgitation, OSA on CPAP, family history of CAD, PVD and valvular heart disease (father) as well as history of GERD, hiatal hernia s/p repair and Gout who is being seen today for the evaluation of chest pain at the request of Dr. Broadus John, Internal Medicine.   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 His daughter is a PA and works in Teacher, English as a foreign language in [place]. His cardiac risk factors only include a family history of heart disease. His father had CAD, PVD and valvular heart disease and underwent triple coronary artery bypass + aortic aneurysm repair and valve replacement, in his 9s. His mother had history of atrial fibrillation and TIAs. He denies any personally history of HTN, HLD, diabetes and no tobacco use. His medical problems include GERD and hiatal hernia s/p surgical repair ~15 years ago. He has Gout, on prophylactic treatment with Allopurinol. About 10 years ago, he was diagnosed with OSA and has since been on CPAP therapy and reports full compliance. At the time of diagnosis, he was also noted to have a murmur and was told that he had mitral regurgitation. It appeared that he was also noted to have mild aortic root dilatation and recommendations were made to follow with surveillance echocardiograms every 2 years. His most recent echo showed progression in the size of his aortic root, leading to a chest CT, which was done 2 weeks ago. Pt was told that his aortic root measured 5.2 cm. A TEE was also done last week to better assess the mitral valve. However the patient reports that he was not given any  specific details regarding the result of ultrasound. His cardiologist in [place] recommended a diagnostic LHC, scheduled this coming Friday, however the patient reports that he preferred to have w/u done at [facility].  Pt presented to [facility] ED last night, 01/25/18, with CC of chest pain. Pt notes progressive CP and dyspnea, that has worsened over the last several weeks.  He has had combination of resting CP, nocturnal CP awakening him from his sleep and exertional CP. No postprandial symptoms and does not feel like reflux. CP is located in the left chest but does not radiate. Feels like pressure. Episodes last 5-30 min at a time. Resolves spontaneously.  He also noted exertional dyspnea walking up stairs. No resting dyspnea. Denies LEE. Given his progressive symptoms, he decided to come to [facility] for further evaluation.   POC troponin in the ED negative x 2. Actual lab troponin I negative x 1. CBC and BMP WNL. Admit EKG showed NSR with HR 76 bpm, however repeat EKG this morning shows sinus bradycardia with 1st degree AVB and HR at 46 bpm.  CXR notable for no active cardiopulmonary disease. Borderline cardiomegaly noted as well as a small hiatal hernia. 2D echo pending. BP is well controlled in the 696V systolic. HR in the upper 40s - mid 50s. He is on BB therapy with low dose metoprolol, 12.5 mg BID. He is currently CP free.   Past Medical History:  Diagnosis Date  . Aortic root aneurysm ([place]) 01/11/2018   5.2cm according to CT scan  .  GERD (gastroesophageal reflux disease)   . History of repair of hiatal hernia 2001    History reviewed. No pertinent surgical history.   Home Medications:  Prior to Admission medications   Medication Sig Start Date End Date Taking? Authorizing Provider  allopurinol (ZYLOPRIM) 300 MG tablet Take 450 mg by mouth every evening.  11/01/17  Yes [provider]  metoprolol tartrate (LOPRESSOR) 25 MG tablet Take 25 mg by mouth 2 (two) times daily. 01/21/18  Yes [provider]    Inpatient Medications: Scheduled Meds: . allopurinol  450 mg Oral QPM  . [START ON 01/27/2018] aspirin EC  81 mg Oral Daily  . atorvastatin  80 mg Oral q1800  . metoprolol tartrate  12.5 mg Oral BID   Continuous Infusions: . sodium chloride 75 mL/hr at 01/26/18 0511  . heparin 1,150 Units/hr (01/26/18 0515)   PRN Meds: acetaminophen, nitroGLYCERIN, ondansetron (ZOFRAN) IV  Allergies:   No Known Allergies  Social History:   Social History   Socioeconomic History  . Marital status: Married    Spouse name: Not on file  . Number of children: Not on file  . Years of education: Not on file  . Highest education level: Not on file  Occupational History  . Not on file  Social Needs  . Financial resource strain: Not on file  . Food insecurity:    Worry: Not on file    Inability: Not on file  . Transportation needs:    Medical: Not on file    Non-medical: Not on file  Tobacco Use  . Smoking status: Never Smoker  . Smokeless tobacco: Never Used  Substance and Sexual Activity  . Alcohol use: Never    Frequency: Never  . Drug use: Never  . Sexual activity: Not on file  Lifestyle  . Physical activity:    Days per week: Not on file    Minutes per session: Not on file  . Stress: Not on file  Relationships  . Social connections:    Talks on phone: Not on file    Gets together: Not on file    Attends religious service: Not on file    Active member of club or organization: Not on file    Attends meetings of clubs or organizations: Not on file    Relationship status: Not on file  . Intimate partner violence:    Fear of current or ex partner: Not on file    Emotionally abused: Not on file    Physically abused: Not on file    Forced sexual activity: Not on file  Other Topics Concern  . Not on file  Social History Narrative  . Not on file    Family History:    Family History  Problem Relation Age of Onset  . Coronary artery disease Father        s/p  CABGx3, AAA repair, Valve replacement  . Atrial fibrillation Mother   . Transient ischemic attack Mother      ROS:  Please see the history of present illness.   All other ROS reviewed and negative.     Physical Exam/Data:   Vitals:   01/26/18 0313 01/26/18 0315 01/26/18 0352 01/26/18 0722  BP: 102/60 102/61 128/87 117/79  Pulse:   (!) 56 (!) 51  Resp:    16  Temp:   97.7 F (36.5 C) 97.6 F (36.4 C)  TempSrc:   Oral Oral  SpO2: 100% 100% 100% 99%  Weight:  94.2 kg   Height:   5\' 11"  (1.803 m)     Intake/Output Summary (Last 24 hours) at 01/26/2018 1033 Last data filed at 01/26/2018 0620 Gross per 24 hour  Intake 137.33 ml  Output -  Net 137.33 ml   Filed Weights   01/26/18 0352  Weight: 94.2 kg   Body mass index is 28.97 kg/m.  General:  Well nourished, well developed, in no acute distress HEENT: normal Lymph: no adenopathy Neck: no JVD Endocrine:  No thryomegaly Vascular: No carotid bruits; FA pulses 2+ bilaterally without bruits  Cardiac:  normal S1, S2; regular rhythm, bradycardia no murmur  Lungs:  clear to auscultation bilaterally, no wheezing, rhonchi or rales  Abd: soft, nontender, no hepatomegaly  Ext: no edema Musculoskeletal:  No deformities, BUE and BLE strength normal and equal Skin: warm and dry  Neuro:  CNs 2-12 intact, no focal abnormalities noted Psych:  Normal affect   EKG:  The EKG was personally reviewed and demonstrates:  Sinus bradycardia w/ 1st degree AVB, 48 bpm Telemetry:  Telemetry was personally reviewed and demonstrates:  Sinus bradycardia  Relevant CV Studies: None  2D Echo pending  Laboratory Data:  Chemistry Recent Labs  Lab 01/25/18 1607  NA 141  K 4.2  CL 103  CO2 27  GLUCOSE 111*  BUN 13  CREATININE 1.19  CALCIUM 10.1  GFRNONAA >60  GFRAA >60  ANIONGAP 11    No results for input(s): PROT, ALBUMIN, AST, ALT, ALKPHOS, BILITOT in the last 168 hours. Hematology Recent Labs  Lab 01/25/18 1607  WBC 7.8  RBC  5.62  HGB 16.6  HCT 49.8  MCV 88.6  MCH 29.5  MCHC 33.3  RDW 13.3  PLT 163   Cardiac Enzymes Recent Labs  Lab 01/26/18 0446  TROPONINI <0.03    Recent Labs  Lab 01/25/18 1617 01/25/18 2232  TROPIPOC 0.02 0.01    BNPNo results for input(s): BNP, PROBNP in the last 168 hours.  DDimer No results for input(s): DDIMER in the last 168 hours.  Radiology/Studies:  Dg Chest 2 View  Result Date: 01/25/2018 CLINICAL DATA:  Chest heaviness, intermittent pressure over the last 3 weeks, some shortness of breath and nausea EXAM: CHEST - 2 VIEW COMPARISON:  None. FINDINGS: No active infiltrate or effusion is seen. Mediastinal and hilar contours are unremarkable. The heart may be borderline enlarged. There is a small hiatal hernia noted. No acute bony abnormality is seen. IMPRESSION: 1. No active cardiopulmonary disease. 2. Borderline cardiomegaly. 3. Small hiatal hernia. Electronically Signed   By: Ivar Drape M.D.   On: 01/25/2018 16:26    Assessment and Plan:   Orry Sigl is a 62 y.o. male with a hx of aortic root aneurysm which is followed in [place], h/o mitral regurgitation, OSA on CPAP, family history of CAD, PVD and valvular heart disease (father) as well as history of GERD, hiatal hernia s/p repair and Gout who is being seen today for the evaluation of chest pain at the request of Dr. Broadus John, Internal Medicine.   1. Chest Pain: based on symptom characteristics his CP is highly concerning for cardiac etiology/ unstable angina. He has known PVD with thoracic AA, measuring at 5.2 cm on recent chest CT done in [place]. He also has significant family history of heart disease. His father had CAD, PVD and valvular heart disease and underwent triple coronary artery bypass + aortic aneurysm repair and valve replacement, in his 3s. He is currently CP free and cardiac  enzymes negative x 3. Recommend either diagnostic LHC vs coronary CTA to define coronary anatomy. His resting HR is in the upper  40s-mid 50s, thus a good candidate for coronary CTA. Will defer to MD.   2. Aortic Aneurysm: per pt, recent chest CT at outside hospital showed thoracic aortic aneurysm, measuring at 5.2 cm. I have discussed with primary attending Dr. Broadus John, who will request cardiology records including CT report [facility] review. He will need referral to vascular surgery for close surveillance/ surgical repair. His BP is well controlled and HR <50 on BB. Will continue at current dose.   3. ?Mitral Valve Disease: pt reports being told in the past of MR. However no notable murmurs on exam. Echocardiogram has been completed. Read pending. Will follow.    4. Sinus Bradycardia: resting HR in the upper 40s-50s, but on low dose BB, which was recently added by primary cardiologist given AA. He denies syncope/ near syncope and dizziness. Metoprolol home dose of 25 mg BID has been weaned down to 12.5 mg BID. Will continue to monitor.   5. OSA: continue w/ CPAP QHS.   6. GERD/Hital Hernia: s/p surgical repair of hiatal hernia ~15 years ago. Pt denies any postprandial CP/ indigestion.   7. Gout: stable. No acute flare. Continue Allopurinol for prophylaxis.   8. Other Screening: recommend FLP for baseline assessment of lipids. Consider Hgb A1c to screen for DM.    MD to follow with further recommendations.    For questions or updates, please contact [place] Please consult [email or web address] for contact info under Cardiology/STEMI.   Signed, Lyda Jester, PA-C  01/26/2018 10:33 AM   Agree with note written by Ellen Henri  PAC  62 year old married Caucasian male from [place] admitted with unstable angina.  He has no risk factors other than family history the father who had a thoracic aortic aneurysm, and CAD status post root repair, aortic valve replacement and CABG.  He is been followed by cardiologist in [place] with every other year 2D echoes which apparently most recently showed a generous  aortic root and mitral regurgitation.  TEE confirmed this and a CTA showed a thoracic aorta measuring 5.2 cm.  He presented here with unstable angina.  Currently pain-free on IV heparin.  His labs are within normal limits.  His exam is benign.  Plan is for diagnostic coronary angiography via the right femoral approach. The patient understands that risks included but are not limited to stroke (1 in 1000), death (1 in 28), kidney failure [usually temporary] (1 in 500), bleeding (1 in 200), allergic reaction [possibly serious] (1 in 200). The patient understands and agrees to proceed   Quay Burow 01/26/2018 11:39 AM

## 2018-01-26 NOTE — Progress Notes (Signed)
ANTICOAGULATION CONSULT NOTE - Initial Consult  Pharmacy Consult for Heparin Indication: chest pain/ACS  No Known Allergies  Patient Measurements: Height: 5\' 11"  (180.3 cm) Weight: 207 lb 11.2 oz (94.2 kg) IBW/kg (Calculated) : 75.3 Heparin Dosing Weight: 90 kg  Vital Signs: Temp: 97.7 F (36.5 C) (09/04 0352) Temp Source: Oral (09/04 0352) BP: 128/87 (09/04 0352) Pulse Rate: 56 (09/04 0352)  Labs: Recent Labs    01/25/18 1607  HGB 16.6  HCT 49.8  PLT 163  CREATININE 1.19    Estimated Creatinine Clearance: 75.5 mL/min (by C-G formula based on SCr of 1.19 mg/dL).   Medical History: Past Medical History:  Diagnosis Date  . Aortic root aneurysm ([place]) 01/11/2018   5.2cm according to CT scan  . GERD (gastroesophageal reflux disease)   . History of repair of hiatal hernia 2001    Medications:  Medications Prior to Admission  Medication Sig Dispense Refill Last Dose  . allopurinol (ZYLOPRIM) 300 MG tablet Take 450 mg by mouth every evening.   1 01/24/2018 at Unknown time  . metoprolol tartrate (LOPRESSOR) 25 MG tablet Take 25 mg by mouth 2 (two) times daily.  12 01/25/2018 at 0800    Assessment: 62 y.o. male with chest pain for heparin Goal of Therapy:  Heparin level 0.3-0.7 units/ml Monitor platelets by anticoagulation protocol: Yes   Plan:  Heparin 4000 units IV bolus, then start heparin 1150 units/hr Check heparin level in 6 hours.   Caryl Pina 01/26/2018,4:30 AM

## 2018-01-26 NOTE — Progress Notes (Signed)
ANTICOAGULATION CONSULT NOTE - Follow Up Consult  Pharmacy Consult for Heparin Indication: chest pain/ACS  No Known Allergies  Patient Measurements: Height: 5\' 11"  (180.3 cm) Weight: 207 lb 11.2 oz (94.2 kg) IBW/kg (Calculated) : 75.3 Heparin Dosing Weight: 90 kg  Vital Signs: Temp: 97.6 F (36.4 C) (09/04 0722) Temp Source: Oral (09/04 0722) BP: 129/78 (09/04 1145) Pulse Rate: 54 (09/04 1145)  Labs: Recent Labs    01/25/18 1607 01/26/18 0446 01/26/18 0648 01/26/18 1144  HGB 16.6  --   --   --   HCT 49.8  --   --   --   PLT 163  --   --   --   LABPROT  --   --  15.1  --   INR  --   --  1.20  --   HEPARINUNFRC  --   --   --  0.88*  CREATININE 1.19  --   --   --   TROPONINI  --  <0.03  --   --     Estimated Creatinine Clearance: 75.5 mL/min (by C-G formula based on SCr of 1.19 mg/dL).  Assessment:  62 yr old male stared to IV heparin early this morning for chest pain.    Initial heparin level is supratherapeutic (0.88) on 1150 units/hr.  Planning cardiac cath later today.  Goal of Therapy:  Heparin level 0.3-0.7 units/ml Monitor platelets by anticoagulation protocol: Yes   Plan:   Decrease heparin drip to 1000 units/hr  Follow up post-cath.  Arty Baumgartner, [place] Pager: [phone] or phone: [phone] 01/26/2018,2:01 PM

## 2018-01-26 NOTE — Progress Notes (Addendum)
PROGRESS NOTE    Marvin Harvey  WPY:[id number] DOB: 02-Apr-1956 DOA: 01/25/2018 PCP: Sherrilee Gilles, DO  Brief Narrative: Marvin Harvey is a 62 y.o. male with medical history significant for gout, now presenting to the emergency department for evaluation of intermittent chest discomfort.  Patient reports recurrent episodes of chest pressure and shortness of breath over the past 2 weeks, lasting several minutes before resolving without intervention, sometimes occurring at rest, and sometimes with exertion.  Reports that he is followed by cardiology in [place] and was recently started on Lopressor.  He also reports that CT chest was recently performed and concerning for 5.2 cm thoracic aortic aneurysm involving the aortic arch.  in ED, EKG features a sinus rhythm with first-degree AV nodal block, incomplete RBBB, and diffuse ST abnormalities, troponin was negative, cardiology following, left heart catheterization and IV heparin recommended   Assessment & Plan:     Unstable angina -troponin negative, chest pain has resolved at this time  -cardiology consulting, continue aspirin, metoprolol, Lipitor, IV heparin -2-D echocardiogram pending -Plan for left heart catheterization today    Thoracic aortic aneurysm ([place]) -requested records, CT report from outside hospital -5.2 cm thoracic aortic aneurysm involving the arch per report -family history of aneurysm in his father who underwent aneurysm repair in his 48s -Follow-up echocardiogram -Await left heart catheterization -Will need quick Vascular surgery evaluation  History of mitral regurgitation - follow-up echocardiogram  History of OSA -Continue CPAP  Gout -Stable, continue allopurinol  DVT prophylaxis: IV heparin Code Status: full code Family Communication: no family at bedside Disposition Plan: home pending above workup  Consultants:   Cards   Procedures:   Antimicrobials:    Subjective: -feels well, no complaints  at this time  Objective: Vitals:   01/26/18 0352 01/26/18 0722 01/26/18 1059 01/26/18 1145  BP: 128/87 117/79  129/78  Pulse: (!) 56 (!) 51 61 (!) 54  Resp:  16  16  Temp: 97.7 F (36.5 C) 97.6 F (36.4 C)    TempSrc: Oral Oral    SpO2: 100% 99%  100%  Weight: 94.2 kg     Height: 5\' 11"  (1.803 m)       Intake/Output Summary (Last 24 hours) at 01/26/2018 1426 Last data filed at 01/26/2018 1350 Gross per 24 hour  Intake 137.33 ml  Output 950 ml  Net -812.67 ml   Filed Weights   01/26/18 0352  Weight: 94.2 kg    Examination:  General exam: Appears calm and comfortable  Respiratory system: Clear to auscultation. Respiratory effort normal. Cardiovascular system: S1 & S2 heard, RRR.  Gastrointestinal system: Abdomen is nondistended, soft and nontender.Normal bowel sounds heard. Central nervous system: Alert and oriented. No focal neurological deficits. Extremities: Symmetric 5 x 5 power. Skin: No rashes, lesions or ulcers Psychiatry: Judgement and insight appear normal. Mood & affect appropriate.     Data Reviewed:   CBC: Recent Labs  Lab 01/25/18 1607  WBC 7.8  HGB 16.6  HCT 49.8  MCV 88.6  PLT 053   Basic Metabolic Panel: Recent Labs  Lab 01/25/18 1607  NA 141  K 4.2  CL 103  CO2 27  GLUCOSE 111*  BUN 13  CREATININE 1.19  CALCIUM 10.1   GFR: Estimated Creatinine Clearance: 75.5 mL/min (by C-G formula based on SCr of 1.19 mg/dL). Liver Function Tests: No results for input(s): AST, ALT, ALKPHOS, BILITOT, PROT, ALBUMIN in the last 168 hours. No results for input(s): LIPASE, AMYLASE in the last 168 hours. No  results for input(s): AMMONIA in the last 168 hours. Coagulation Profile: Recent Labs  Lab 01/26/18 0648  INR 1.20   Cardiac Enzymes: Recent Labs  Lab 01/26/18 0446  TROPONINI <0.03   BNP (last 3 results) No results for input(s): PROBNP in the last 8760 hours. HbA1C: No results for input(s): HGBA1C in the last 72 hours. CBG: No results  for input(s): GLUCAP in the last 168 hours. Lipid Profile: No results for input(s): CHOL, HDL, LDLCALC, TRIG, CHOLHDL, LDLDIRECT in the last 72 hours. Thyroid Function Tests: No results for input(s): TSH, T4TOTAL, FREET4, T3FREE, THYROIDAB in the last 72 hours. Anemia Panel: No results for input(s): VITAMINB12, FOLATE, FERRITIN, TIBC, IRON, RETICCTPCT in the last 72 hours. Urine analysis: No results found for: COLORURINE, APPEARANCEUR, LABSPEC, PHURINE, GLUCOSEU, HGBUR, BILIRUBINUR, KETONESUR, PROTEINUR, UROBILINOGEN, NITRITE, LEUKOCYTESUR Sepsis Labs: @LABRCNTIP (procalcitonin:4,lacticidven:4)  )No results found for this or any previous visit (from the past 240 hour(s)).       Radiology Studies: Dg Chest 2 View  Result Date: 01/25/2018 CLINICAL DATA:  Chest heaviness, intermittent pressure over the last 3 weeks, some shortness of breath and nausea EXAM: CHEST - 2 VIEW COMPARISON:  None. FINDINGS: No active infiltrate or effusion is seen. Mediastinal and hilar contours are unremarkable. The heart may be borderline enlarged. There is a small hiatal hernia noted. No acute bony abnormality is seen. IMPRESSION: 1. No active cardiopulmonary disease. 2. Borderline cardiomegaly. 3. Small hiatal hernia. Electronically Signed   By: Ivar Drape M.D.   On: 01/25/2018 16:26        Scheduled Meds: . allopurinol  450 mg Oral QPM  . [START ON 01/27/2018] aspirin  81 mg Oral Pre-Cath  . [START ON 01/28/2018] aspirin EC  81 mg Oral Daily  . atorvastatin  80 mg Oral q1800  . metoprolol tartrate  12.5 mg Oral BID  . sodium chloride flush  3 mL Intravenous Q12H   Continuous Infusions: . sodium chloride 75 mL/hr at 01/26/18 0511  . sodium chloride    . sodium chloride     Followed by  . sodium chloride    . heparin 1,000 Units/hr (01/26/18 1405)     LOS: 0 days    Time spent: 50min    Domenic Polite, MD [facility] Page via [email or web address], password [email or web address] After 7PM please contact  night-coverage  01/26/2018, 2:26 PM

## 2018-01-26 NOTE — ED Provider Notes (Signed)
Assumed care from Dr. Johnney Killian at shift change.  See prior notes for full H&P.  Briefly, 62 y.o. M here with increased frequency/severity of exertional chest pain over the past 4 weeks.  Previously followed for same in [place].  Came in today due to worsening pain.  Labs overall reassuring.  Initial EKG is nonischemic.  Cardiology was consulted, recommended call back after second troponin.  Plan:  Awaiting delta trop.  Cardiology consult pending.  Anticipate admission.  Results for orders placed or performed during the hospital encounter of 47/42/59  Basic metabolic panel  Result Value Ref Range   Sodium 141 135 - 145 mmol/L   Potassium 4.2 3.5 - 5.1 mmol/L   Chloride 103 98 - 111 mmol/L   CO2 27 22 - 32 mmol/L   Glucose, Bld 111 (H) 70 - 99 mg/dL   BUN 13 8 - 23 mg/dL   Creatinine, Ser 1.19 0.61 - 1.24 mg/dL   Calcium 10.1 8.9 - 10.3 mg/dL   GFR calc non Af Amer >60 >60 mL/min   GFR calc Af Amer >60 >60 mL/min   Anion gap 11 5 - 15  CBC  Result Value Ref Range   WBC 7.8 4.0 - 10.5 K/uL   RBC 5.62 4.22 - 5.81 MIL/uL   Hemoglobin 16.6 13.0 - 17.0 g/dL   HCT 49.8 39.0 - 52.0 %   MCV 88.6 78.0 - 100.0 fL   MCH 29.5 26.0 - 34.0 pg   MCHC 33.3 30.0 - 36.0 g/dL   RDW 13.3 11.5 - 15.5 %   Platelets 163 150 - 400 K/uL  I-stat troponin, ED  Result Value Ref Range   Troponin i, poc 0.02 0.00 - 0.08 ng/mL   Comment 3          I-stat troponin, ED  Result Value Ref Range   Troponin i, poc 0.01 0.00 - 0.08 ng/mL   Comment 3           Dg Chest 2 View  Result Date: 01/25/2018 CLINICAL DATA:  Chest heaviness, intermittent pressure over the last 3 weeks, some shortness of breath and nausea EXAM: CHEST - 2 VIEW COMPARISON:  None. FINDINGS: No active infiltrate or effusion is seen. Mediastinal and hilar contours are unremarkable. The heart may be borderline enlarged. There is a small hiatal hernia noted. No acute bony abnormality is seen. IMPRESSION: 1. No active cardiopulmonary disease.  2. Borderline cardiomegaly. 3. Small hiatal hernia. Electronically Signed   By: Ivar Drape M.D.   On: 01/25/2018 16:26    Discussed with cardiology (Dr. Gilmore Laroche) after second trop, recommends admit to medicine and Cardiology will see in AM.  Patient without active chest pain on reassessment.  VSS.  Discussed with Dr. Myna Hidalgo-- he will admit for ongoing care.  Patient and family updated and agreeable with care plan.   Larene Pickett, PA-C 01/26/18 0101    Merryl Hacker, MD 01/26/18 [phone]

## 2018-01-26 NOTE — Progress Notes (Signed)
  Echocardiogram 2D Echocardiogram has been performed.  Marvin Harvey 01/26/2018, 10:08 AM

## 2018-01-26 NOTE — ED Notes (Signed)
Pt's wife stepped out requesting update from provider. Debe Coder, RN informed [place], [place].

## 2018-01-26 NOTE — H&P (Signed)
History and Physical    Marvin Harvey OAC:[id number] DOB: 14-Jul-1955 DOA: 01/25/2018  PCP: Sherrilee Gilles, DO   Patient coming from: Home   Chief Complaint: Chest pain   HPI: Marvin Harvey is a 62 y.o. male with medical history significant for gout, now presenting to the emergency department for evaluation of intermittent chest discomfort.  Patient reports recurrent episodes of chest pressure and shortness of breath over the past 2 weeks, lasting several minutes before resolving without intervention, sometimes occurring at rest, and sometimes with exertion.  Reports that he is followed by cardiology in [place] and was recently started on Lopressor.  He also reports that CT chest was recently performed and concerning for 5.2 cm thoracic aortic aneurysm involving the aortic arch.  Patient denies any chest pain in the ED, denies shortness of breath or cough, denies any lower extremity swelling or tenderness, and denies any recent fevers or chills.  Reports that he had had stress test remotely, but never angiography.  ED Course: Upon arrival to the ED, patient is found to be afebrile, saturating well on room air, and with vitals otherwise normal.  EKG features a sinus rhythm with first-degree AV nodal block, incomplete RBBB, and diffuse ST abnormalities.  Chemistry panel and CBC are unremarkable and troponin is negative x2.  Cardiology was consulted by the ED physician and recommends medical admission, IV heparin infusion, repeat troponin, repeat EKG, echocardiogram, start Lipitor, continue Lopressor and aspirin.  Review of Systems:  All other systems reviewed and apart from HPI, are negative.  Past Medical History:  Diagnosis Date  . 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 GERD (gastroesophageal reflux disease)   . History of repair of hiatal hernia 2001    History reviewed. No pertinent surgical history.   reports that he has never smoked. He has never used  smokeless tobacco. He reports that he does not drink alcohol or use drugs.  No Known Allergies  Family History  Problem Relation Age of Onset  . Coronary artery disease Father      Prior to Admission medications   Medication Sig Start Date End Date Taking? Authorizing Provider  allopurinol (ZYLOPRIM) 300 MG tablet Take 450 mg by mouth every evening.  11/01/17  Yes [provider]  metoprolol tartrate (LOPRESSOR) 25 MG tablet Take 25 mg by mouth 2 (two) times daily. 01/21/18  Yes [provider]    Physical Exam: Vitals:   01/26/18 0300 01/26/18 0313 01/26/18 0315 01/26/18 0352  BP:  102/60 102/61 128/87  Pulse: 65   (!) 56  Resp: (!) 21     Temp:    97.7 F (36.5 C)  TempSrc:    Oral  SpO2: 97% 100% 100% 100%  Weight:    94.2 kg  Height:    5\' 11"  (1.803 m)      Constitutional: NAD, calm  Eyes: PERTLA, lids and conjunctivae normal ENMT: Mucous membranes are moist. Posterior pharynx clear of any exudate or lesions.   Neck: normal, supple, no masses, no thyromegaly Respiratory: clear to auscultation bilaterally, no wheezing, no crackles. Normal respiratory effort.    Cardiovascular: S1 & S2 heard, regular rate and rhythm. No extremity edema.   Abdomen: No distension, no tenderness, soft. Bowel sounds active.  Musculoskeletal: no clubbing / cyanosis. No joint deformity upper and lower extremities.   Skin: no significant rashes, lesions, ulcers. Warm, dry, well-perfused. Neurologic: CN 2-12 grossly intact. Sensation intact. Strength 5/5 in all  4 limbs.  Psychiatric: Alert and oriented x 3. Calm, cooperative.     Labs on Admission: I have personally reviewed following labs and imaging studies  CBC: Recent Labs  Lab 01/25/18 1607  WBC 7.8  HGB 16.6  HCT 49.8  MCV 88.6  PLT 409   Basic Metabolic Panel: Recent Labs  Lab 01/25/18 1607  NA 141  K 4.2  CL 103  CO2 27  GLUCOSE 111*  BUN 13  CREATININE 1.19  CALCIUM 10.1   GFR: Estimated  Creatinine Clearance: 75.5 mL/min (by C-G formula based on SCr of 1.19 mg/dL). Liver Function Tests: No results for input(s): AST, ALT, ALKPHOS, BILITOT, PROT, ALBUMIN in the last 168 hours. No results for input(s): LIPASE, AMYLASE in the last 168 hours. No results for input(s): AMMONIA in the last 168 hours. Coagulation Profile: No results for input(s): INR, PROTIME in the last 168 hours. Cardiac Enzymes: No results for input(s): CKTOTAL, CKMB, CKMBINDEX, TROPONINI in the last 168 hours. BNP (last 3 results) No results for input(s): PROBNP in the last 8760 hours. HbA1C: No results for input(s): HGBA1C in the last 72 hours. CBG: No results for input(s): GLUCAP in the last 168 hours. Lipid Profile: No results for input(s): CHOL, HDL, LDLCALC, TRIG, CHOLHDL, LDLDIRECT in the last 72 hours. Thyroid Function Tests: No results for input(s): TSH, T4TOTAL, FREET4, T3FREE, THYROIDAB in the last 72 hours. Anemia Panel: No results for input(s): VITAMINB12, FOLATE, FERRITIN, TIBC, IRON, RETICCTPCT in the last 72 hours. Urine analysis: No results found for: COLORURINE, APPEARANCEUR, LABSPEC, PHURINE, GLUCOSEU, HGBUR, BILIRUBINUR, KETONESUR, PROTEINUR, UROBILINOGEN, NITRITE, LEUKOCYTESUR Sepsis Labs: @LABRCNTIP (procalcitonin:4,lacticidven:4) )No results found for this or any previous visit (from the past 240 hour(s)).   Radiological Exams on Admission: Dg Chest 2 View  Result Date: 01/25/2018 CLINICAL DATA:  Chest heaviness, intermittent pressure over the last 3 weeks, some shortness of breath and nausea EXAM: CHEST - 2 VIEW COMPARISON:  None. FINDINGS: No active infiltrate or effusion is seen. Mediastinal and hilar contours are unremarkable. The heart may be borderline enlarged. There is a small hiatal hernia noted. No acute bony abnormality is seen. IMPRESSION: 1. No active cardiopulmonary disease. 2. Borderline cardiomegaly. 3. Small hiatal hernia. Electronically Signed   By: Ivar Drape M.D.    On: 01/25/2018 16:26    EKG: Independently reviewed. Sinus rhythm, 1st degree AV block, incomplete RBBB, diffuse ST abnormality.   Assessment/Plan   1. Chest pain  - Presents with intermittent chest pain for the past 2 weeks  - Troponin negative x2, EKG with ST abnormalities and no prior available, and CXR with no acute findings  - Cardiology consulted by ED physician and recommends admission to medical service, cardiac monitoring, IV heparin infusion, daily ASA 81, Lipitor 80, repeat troponin and EKG, echocardiogram, Lopressor 12.5 mg BID, and prn NTG    2. Thoracic aortic aneurysm  - Patient reports recent CT that revealed a 5.2 cm aneurysm involving aortic arch  - No acute abnormality on CXR  - Echo is ordered as above     DVT prophylaxis: IV heparin infusion  Code Status: Full  Family Communication: Wife updated at bedside Consults called: Cardiology Admission status: Inpatient     Vianne Bulls, MD [facility] Pager [phone]  If 7PM-7AM, please contact night-coverage [email or web address] Password [email or web address]  01/26/2018, 4:28 AM

## 2018-01-26 NOTE — Interval H&P Note (Signed)
Cath Lab Visit (complete for each Cath Lab visit)  Clinical Evaluation Leading to the Procedure:   ACS: Yes.    Non-ACS:    Anginal Classification: CCS III  Anti-ischemic medical therapy: No Therapy  Non-Invasive Test Results: No non-invasive testing performed  Prior CABG: No previous CABG      History and Physical Interval Note:  01/26/2018 5:13 PM  Marvin Harvey  has presented today for surgery, with the diagnosis of cp  The various methods of treatment have been discussed with the patient and family. After consideration of risks, benefits and other options for treatment, the patient has consented to  Procedure(s): LEFT HEART CATH AND CORS/GRAFTS ANGIOGRAPHY (N/A) as a surgical intervention .  The patient's history has been reviewed, patient examined, no change in status, stable for surgery.  I have reviewed the patient's chart and labs.  Questions were answered to the patient's satisfaction.     Quay Burow

## 2018-01-27 ENCOUNTER — Telehealth: Payer: Self-pay | Admitting: Cardiovascular Disease

## 2018-01-27 ENCOUNTER — Encounter (HOSPITAL_COMMUNITY): Payer: Self-pay | Admitting: Cardiovascular Disease

## 2018-01-27 DIAGNOSIS — I208 Other forms of angina pectoris: Secondary | ICD-10-CM | POA: Diagnosis not present

## 2018-01-27 DIAGNOSIS — I25110 Atherosclerotic heart disease of native coronary artery with unstable angina pectoris: Secondary | ICD-10-CM | POA: Diagnosis not present

## 2018-01-27 DIAGNOSIS — I440 Atrioventricular block, first degree: Secondary | ICD-10-CM | POA: Diagnosis not present

## 2018-01-27 DIAGNOSIS — I712 Thoracic aortic aneurysm, without rupture: Secondary | ICD-10-CM | POA: Diagnosis not present

## 2018-01-27 DIAGNOSIS — Q2543 Congenital aneurysm of aorta: Secondary | ICD-10-CM | POA: Diagnosis not present

## 2018-01-27 DIAGNOSIS — I719 Aortic aneurysm of unspecified site, without rupture: Secondary | ICD-10-CM | POA: Diagnosis not present

## 2018-01-27 LAB — BASIC METABOLIC PANEL
Anion gap: 10 (ref 5–15)
BUN: 13 mg/dL (ref 8–23)
CALCIUM: 8.9 mg/dL (ref 8.9–10.3)
CO2: 22 mmol/L (ref 22–32)
CREATININE: 0.96 mg/dL (ref 0.61–1.24)
Chloride: 108 mmol/L (ref 98–111)
GFR calc Af Amer: >60 mL/min (ref >60)
GFR calc non Af Amer: >60 mL/min (ref >60)
Glucose, Bld: 113 mg/dL — ABNORMAL HIGH (ref 70–99)
Potassium: 4.1 mmol/L (ref 3.5–5.1)
SODIUM: 140 mmol/L (ref 135–145)

## 2018-01-27 LAB — CBC
HEMATOCRIT: 44.5 % (ref 39.0–52.0)
HEMOGLOBIN: 14.8 g/dL (ref 13.0–17.0)
MCH: 29.8 pg (ref 26.0–34.0)
MCHC: 33.3 g/dL (ref 30.0–36.0)
MCV: 89.5 fL (ref 78.0–100.0)
Platelets: 142 10*3/uL — ABNORMAL LOW (ref 150–400)
RBC: 4.97 MIL/uL (ref 4.22–5.81)
RDW: 13.3 % (ref 11.5–15.5)
WBC: 6.8 10*3/uL (ref 4.0–10.5)

## 2018-01-27 MED ORDER — METOPROLOL TARTRATE 25 MG PO TABS: 12.5000 mg | ORAL_TABLET | Freq: Two times a day (BID) | ORAL | 0 refills | Status: DC

## 2018-01-27 NOTE — Progress Notes (Signed)
Order received to discharge patient.  Telemetry monitor removed and CCMD notified.  PIV access removed x2.  Discharge instructions, follow up, medications and instructions for their use discussed with patient. 

## 2018-01-27 NOTE — Progress Notes (Signed)
Progress Note  Patient Name: Marvin Harvey Date of Encounter: 01/27/2018  Primary Cardiologist: Dr. Quay Burow  Subjective   Postop day 1 cardiac catheterization performed via the right femoral approach revealing normal coronary arteries and normal LV function.  His right groin is intact and stable.  He said no chest pain.  Inpatient Medications    Scheduled Meds: . allopurinol  450 mg Oral QPM  . aspirin EC  81 mg Oral Daily  . atorvastatin  80 mg Oral q1800  . metoprolol tartrate  12.5 mg Oral BID  . sodium chloride flush  3 mL Intravenous Q12H   Continuous Infusions: . sodium chloride     PRN Meds: sodium chloride, acetaminophen, morphine injection, nitroGLYCERIN, ondansetron (ZOFRAN) IV, sodium chloride flush   Vital Signs    Vitals:   01/26/18 1845 01/26/18 1900 01/26/18 1939 01/27/18 0418  BP: 128/73 103/75 124/75 118/72  Pulse: (!) 58 61 61 63  Resp: 17 16 17 14   Temp:   97.9 F (36.6 C) (!) 97.5 F (36.4 C)  TempSrc:   Oral Oral  SpO2: 97% 98% 96% 98%  Weight:    95.2 kg  Height:        Intake/Output Summary (Last 24 hours) at 01/27/2018 1057 Last data filed at 01/27/2018 0248 Gross per 24 hour  Intake 1300.13 ml  Output 1800 ml  Net -499.87 ml   Filed Weights   01/26/18 0352 01/27/18 0418  Weight: 94.2 kg 95.2 kg    Telemetry    Sinus rhythm- Personally Reviewed  ECG    Sinus bradycardia at 58 without ST or T wave changes- Personally Reviewed  Physical Exam   GEN: No acute distress.   Neck: No JVD Cardiac: RRR, no murmurs, rubs, or gallops.  Respiratory: Clear to auscultation bilaterally. GI: Soft, nontender, non-distended  MS: No edema; No deformity. Neuro:  Nonfocal  Psych: Normal affect  Extremities: Right femoral puncture site is well-healed and stable.   Labs    Chemistry Recent Labs  Lab 01/25/18 1607 01/27/18 0343  NA 141 140  K 4.2 4.1  CL 103 108  CO2 27 22  GLUCOSE 111* 113*  BUN 13 13  CREATININE 1.19 0.96    CALCIUM 10.1 8.9  GFRNONAA >60 >60  GFRAA >60 >60  ANIONGAP 11 10     Hematology Recent Labs  Lab 01/25/18 1607 01/27/18 0343  WBC 7.8 6.8  RBC 5.62 4.97  HGB 16.6 14.8  HCT 49.8 44.5  MCV 88.6 89.5  MCH 29.5 29.8  MCHC 33.3 33.3  RDW 13.3 13.3  PLT 163 142*    Cardiac Enzymes Recent Labs  Lab 01/26/18 0446  TROPONINI <0.03    Recent Labs  Lab 01/25/18 1617 01/25/18 2232  TROPIPOC 0.02 0.01     BNPNo results for input(s): BNP, PROBNP in the last 168 hours.   DDimer No results for input(s): DDIMER in the last 168 hours.   Radiology    Dg Chest 2 View  Result Date: 01/25/2018 CLINICAL DATA:  Chest heaviness, intermittent pressure over the last 3 weeks, some shortness of breath and nausea EXAM: CHEST - 2 VIEW COMPARISON:  None. FINDINGS: No active infiltrate or effusion is seen. Mediastinal and hilar contours are unremarkable. The heart may be borderline enlarged. There is a small hiatal hernia noted. No acute bony abnormality is seen. IMPRESSION: 1. No active cardiopulmonary disease. 2. Borderline cardiomegaly. 3. Small hiatal hernia. Electronically Signed   By: Windy Canny.D.  On: 01/25/2018 16:26    Cardiac Studies   2D echocardiogram (01/26/2018)  Study Conclusions  - Left ventricle: The cavity size was normal. Wall thickness was   normal. Systolic function was vigorous. The estimated ejection   fraction was in the range of 65% to 70%. Wall motion was normal;   there were no regional wall motion abnormalities. Features are   consistent with a pseudonormal left ventricular filling pattern,   with concomitant abnormal relaxation and increased filling   pressure (grade 2 diastolic dysfunction). - Aortic valve: There was mild regurgitation. Valve area (VTI):   2.84 cm^2. Valve area (Vmax): 2.89 cm^2. Valve area (Vmean): 2.96   cm^2  Catheterization (01/26/2018)  IMPRESSION: Mr. Rigor has normal coronary arteries and normal LV function.  I believe his  chest pain is noncardiac.  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  His right femoral arterial puncture site was hemostatic hemostatically sealed with a MYNX device successfully.  He left the lab in stable condition.  He will be gently hydrated overnight, and discharged home in the morning.  Patient Profile     Marvin Harvey is a 62 y.o. male with a hx of aortic root aneurysm which is followed in [place], h/o mitral regurgitation, OSA on CPAP, family history of CAD, PVD and valvular heart disease (father) as well as history of GERD, hiatal hernia s/p repair and Gout who underwent cardiac catheterization yesterday by myself revealing normal coronaries and normal LV function.  A 2D echo revealed trace AI with a dilated aortic root.  The etiology of his chest pain is still unclear.  Assessment & Plan    1: Chest pain- normal coronaries a cath yesterday.  He does have a 5.2 cm ascending thoracic aortic aneurysm.  The etiology of his chest pain is still unclear.  2: Thoracic aortic aneurysm- 5.2 cm ascending thoracic aortic aneurysm on recent CTA performed in [place].  He is having somewhat atypical chest pain.  I am going to get a thoracic surgeon to see him and evaluate him for this prior to discharge.  He was started on low-dose beta-blockade for this as well.   [facility] will sign off.   Medication Recommendations: No changes Other recommendations (labs, testing, etc): No changes Follow up as an outpatient: With me in 2 to 3 weeks.  We will arrange  For questions or updates, please contact [place] Please consult [email or web address] for contact info under Cardiology/STEMI.      Signed, Quay Burow, MD  01/27/2018, 10:57 AM

## 2018-01-27 NOTE — Consult Note (Signed)
Reason for Consult:Ascending aneurysm Referring Physician: Dr. Marylene Land is an 62 y.o. male.  HPI: 62 yo man with a past history of reflux, a hiatal hernia repair, gout and a family history of CAD and aortic aneurysm, presents with a cc/o CP. He has been having left sided chest pressure with shortness of breath lasting from 5-30 minutes over the past 2 weeks. Has been having some discomfort longer than that but has increased in frequency recently. He has been followed by Dr. Sabra Heck in [place] for years. No pattern to the pain. Sometimes with exertion, occasionally at rest. Other times no pain with similar levels of exertion. Has been told he had an ascending aneurysm. Not sure of size until recent CT showed it to be 5.2 cm.  Past Medical History:  Diagnosis Date  . 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 GERD (gastroesophageal reflux disease)   . History of repair of hiatal hernia 2001    Past Surgical History:  Procedure Laterality Date  . LEFT HEART CATH AND CORS/GRAFTS ANGIOGRAPHY N/A 01/26/2018   Procedure: LEFT HEART CATH AND CORS/GRAFTS ANGIOGRAPHY;  Surgeon: Lorretta Harp, MD;  Location: [facility];  Service: Cardiovascular;  Laterality: N/A;    Family History  Problem Relation Age of Onset  . Coronary artery disease Father        s/p CABGx3, AAA repair, Valve replacement  . Atrial fibrillation Mother   . Transient ischemic attack Mother     Social History:  reports that he has never smoked. He has never used smokeless tobacco. He reports that he does not drink alcohol or use drugs.  Allergies: No Known Allergies  Medications:  Prior to Admission:  Medications Prior to Admission  Medication Sig Dispense Refill Last Dose  . allopurinol (ZYLOPRIM) 300 MG tablet Take 450 mg by mouth every evening.   1 01/24/2018 at Unknown time  . [DISCONTINUED] metoprolol tartrate (LOPRESSOR) 25 MG tablet Take 25 mg by mouth 2 (two) times  daily.  12 01/25/2018 at 0800    Results for orders placed or performed during the hospital encounter of 01/25/18 (from the past 48 hour(s))  Basic metabolic panel     Status: Abnormal   Collection Time: 01/25/18  4:07 PM  Result Value Ref Range   Sodium 141 135 - 145 mmol/L   Potassium 4.2 3.5 - 5.1 mmol/L   Chloride 103 98 - 111 mmol/L   CO2 27 22 - 32 mmol/L   Glucose, Bld 111 (H) 70 - 99 mg/dL   BUN 13 8 - 23 mg/dL   Creatinine, Ser 1.19 0.61 - 1.24 mg/dL   Calcium 10.1 8.9 - 10.3 mg/dL   GFR calc non Af Amer >60 >60 mL/min   GFR calc Af Amer >60 >60 mL/min    Comment: (NOTE) The eGFR has been calculated using the CKD EPI equation. This calculation has not been validated in all clinical situations. eGFR's persistently <60 mL/min signify possible Chronic Kidney Disease.    Anion gap 11 5 - 15    Comment: Performed at [facility] [address] [place] [address]  CBC     Status: None   Collection Time: 01/25/18  4:07 PM  Result Value Ref Range   WBC 7.8 4.0 - 10.5 K/uL   RBC 5.62 4.22 - 5.81 MIL/uL   Hemoglobin 16.6 13.0 - 17.0 g/dL   HCT 49.8 39.0 - 52.0 %  MCV 88.6 78.0 - 100.0 fL   MCH 29.5 26.0 - 34.0 pg   MCHC 33.3 30.0 - 36.0 g/dL   RDW 13.3 11.5 - 15.5 %   Platelets 163 150 - 400 K/uL    Comment: Performed at [facility], [place] [address] [place], [place] [address]  I-stat troponin, ED     Status: None   Collection Time: 01/25/18  4:17 PM  Result Value Ref Range   Troponin i, poc 0.02 0.00 - 0.08 ng/mL   Comment 3            Comment: Due to the release kinetics of cTnI, a negative result within the first hours of the onset of symptoms does not rule out myocardial infarction with certainty. If myocardial infarction is still suspected, repeat the test at appropriate intervals.   I-stat troponin, ED     Status: None   Collection Time: 01/25/18 10:32 PM  Result Value Ref Range   Troponin i, poc 0.01 0.00 - 0.08 ng/mL   Comment 3             Comment: Due to the release kinetics of cTnI, a negative result within the first hours of the onset of symptoms does not rule out myocardial infarction with certainty. If myocardial infarction is still suspected, repeat the test at appropriate intervals.   HIV antibody (Routine Testing)     Status: None   Collection Time: 01/26/18  4:46 AM  Result Value Ref Range   HIV Screen 4th Generation wRfx Non Reactive Non Reactive    Comment: (NOTE) Performed At: [facility] [place], [place] [id number] Rush Farmer MD ME:[id number]   Troponin I     Status: None   Collection Time: 01/26/18  4:46 AM  Result Value Ref Range   Troponin I <0.03 <0.03 ng/mL    Comment: Performed at [facility] [address], [place], [place] [address]  Protime-INR     Status: None   Collection Time: 01/26/18  6:48 AM  Result Value Ref Range   Prothrombin Time 15.1 11.4 - 15.2 seconds   INR 1.20     Comment: Performed at [facility] [address] [place], [place] [address]  Heparin level (unfractionated)     Status: Abnormal   Collection Time: 01/26/18 11:44 AM  Result Value Ref Range   Heparin Unfractionated 0.88 (H) 0.30 - 0.70 IU/mL    Comment: (NOTE) If heparin results are below expected values, and patient dosage has  been confirmed, suggest follow up testing of antithrombin III levels. Performed at [facility], [place] [address], [place], [place] [address]   Basic metabolic panel     Status: Abnormal   Collection Time: 01/27/18  3:43 AM  Result Value Ref Range   Sodium 140 135 - 145 mmol/L   Potassium 4.1 3.5 - 5.1 mmol/L   Chloride 108 98 - 111 mmol/L   CO2 22 22 - 32 mmol/L   Glucose, Bld 113 (H) 70 - 99 mg/dL   BUN 13 8 - 23 mg/dL   Creatinine, Ser 0.96 0.61 - 1.24 mg/dL   Calcium 8.9 8.9 - 10.3 mg/dL   GFR calc non Af Amer >60 >60 mL/min   GFR calc Af Amer >60 >60 mL/min    Comment: (NOTE) The eGFR has been calculated using the CKD EPI  equation. This calculation has not been validated in all clinical situations. eGFR's persistently <60 mL/min signify  possible Chronic Kidney Disease.    Anion gap 10 5 - 15    Comment: Performed at [facility] [address] [place], [place] [address]  CBC     Status: Abnormal   Collection Time: 01/27/18  3:43 AM  Result Value Ref Range   WBC 6.8 4.0 - 10.5 K/uL   RBC 4.97 4.22 - 5.81 MIL/uL   Hemoglobin 14.8 13.0 - 17.0 g/dL   HCT 44.5 39.0 - 52.0 %   MCV 89.5 78.0 - 100.0 fL   MCH 29.8 26.0 - 34.0 pg   MCHC 33.3 30.0 - 36.0 g/dL   RDW 13.3 11.5 - 15.5 %   Platelets 142 (L) 150 - 400 K/uL    Comment: Performed at [facility], [place] [address], [place], [place] [address]    Dg Chest 2 View  Result Date: 01/25/2018 CLINICAL DATA:  Chest heaviness, intermittent pressure over the last 3 weeks, some shortness of breath and nausea EXAM: CHEST - 2 VIEW COMPARISON:  None. FINDINGS: No active infiltrate or effusion is seen. Mediastinal and hilar contours are unremarkable. The heart may be borderline enlarged. There is a small hiatal hernia noted. No acute bony abnormality is seen. IMPRESSION: 1. No active cardiopulmonary disease. 2. Borderline cardiomegaly. 3. Small hiatal hernia. Electronically Signed   By: Ivar Drape M.D.   On: 01/25/2018 16:26    Review of Systems  Constitutional: Positive for malaise/fatigue. Negative for fever.  Respiratory: Positive for shortness of breath. Negative for cough.   Cardiovascular: Positive for chest pain. Negative for palpitations, orthopnea and claudication.  Gastrointestinal: Negative for nausea and vomiting.  Neurological: Negative for focal weakness and loss of consciousness.   Blood pressure 118/72, pulse 63, temperature (!) 97.5 F (36.4 C), temperature source Oral, resp. rate 14, height _0  (1.803 m), weight 95.2 kg, SpO2 98 %. Physical Exam  Vitals reviewed. Constitutional: He is oriented to person, place, and time. He  appears well-developed and well-nourished. No distress.  HENT:  Head: Normocephalic and atraumatic.  Mouth/Throat: No oropharyngeal exudate.  Eyes: Conjunctivae and EOM are normal.  Neck: Neck supple. No thyromegaly present.  Cardiovascular: Normal rate, regular rhythm, normal heart sounds and intact distal pulses. Exam reveals no gallop and no friction rub.  No murmur heard. Respiratory: Effort normal and breath sounds normal. No respiratory distress. He has no wheezes. He has no rales.  GI: Soft. He exhibits no distension. There is no tenderness.  Musculoskeletal: He exhibits no edema.  Lymphadenopathy:    He has no cervical adenopathy.  Neurological: He is alert and oriented to person, place, and time. No cranial nerve deficit. He exhibits normal muscle tone. Coordination normal.  Skin: Skin is warm and dry.   ECHOCARDIOGRAM Study Conclusions  - Left ventricle: The cavity size was normal. Wall thickness was   normal. Systolic function was vigorous. The estimated ejection   fraction was in the range of 65% to 70%. Wall motion was normal;   there were no regional wall motion abnormalities. Features are   consistent with a pseudonormal left ventricular filling pattern,   with concomitant abnormal relaxation and increased filling   pressure (grade 2 diastolic dysfunction). - Aortic valve: There was mild regurgitation. Valve area (VTI):   2.84 cm^2. Valve area (Vmax): 2.89 cm^2. Valve area (Vmean): 2.96   cm^2.  CARDIAC CATHETERIZATION Conclusion     The left ventricular systolic function is normal.  LV end diastolic pressure is normal.  The left  ventricular ejection fraction is 55-65% by visual estimate.    I personally reviewed the cath images and concur with the findings noted above  Assessment/Plan: Mr. Vantine is a 62 yo man with a past history of reflux and hiatal hernia repair. He also has a known ascending thoracic aortic aneurysm, reportedly 5.2 cm in diameter. He  presents with atypical chest pain. Workup for CAD is negative. Unlikely to be a cardiac source. Wonder if could be GERD related as he has had surgery for that in the past.   The pain is not consistent with that associated with an ascending aneurysm.   He will need follow up for the aneurysm. Will review CT from [facility] when it is available. Based on report will need repeat CT angio in 6 months.  Melrose Nakayama 01/27/2018, 2:33 PM

## 2018-01-27 NOTE — Telephone Encounter (Signed)
Called patient and LVM to call back to schedule hospital followup with Dr. Kennon Holter team.

## 2018-02-09 NOTE — Discharge Summary (Signed)
Physician Discharge Summary  [name] PJK:[id number] DOB: 1956-01-14 DOA: 01/25/2018  PCP: Sherrilee Gilles, DO  Admit date: 01/25/2018 Discharge date: 01/27/2018  Time spent: 35 minutes  Recommendations for Outpatient Follow-up:  CVTS Dr.Hendrickson in 2 weeks Cardiology Dr. 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 Patient reports recurrent episodes of chest pressure and shortness of breath over the past 2 weeks, lasting several minutes before resolving without intervention, sometimes occurring at rest, and sometimes with exertion. Reports that he is followed by cardiology in [place] and was recently started on Lopressor. He also reports that CT chest was recently performed and concerning for 5.2 cm thoracic aortic aneurysm involving the aortic arch.  Hospital Course:    Unstable angina -troponin negative, chest pain has resolved after admission  -cardiology consulted, treated with aspirin, metoprolol, Lipitor, IV heparin -2-D echocardiogram completed showed normal EF and grade 2 diastolic dysfunction -left heart catheterization completed yesterday and normal    Thoracic aortic aneurysm ([place]) -requested records, CT report from outside hospital -5.2 cm thoracic aortic aneurysm involving the arch per report -family history of aneurysm in his father who underwent aneurysm repair in his 43s -left heart catheterization did note the thoracic aortic aneurysm, CTS Dr. Roxan Hockey was consulted, he will follow-up outpatient in few weeks and most likely get a CTA  in 6 months -with anticipation for surgery in the near future  History of mitral regurgitation - no significant regurgitation noted on echo this admission  History of OSA -Continue CPAP  Gout -Stable, continue allopurinol  Procedures:  L heart cath: Marvin Harvey has normal coronary arteries and normal LV function.  I believe his chest pain is noncardiac.  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   Consultations:  Cardiology  CVTS dr.Hendrickson  Discharge Exam: Vitals:   01/27/18 0418 01/27/18 1440  BP: 118/72 116/81  Pulse: 63 66  Resp: 14 18  Temp: (!) 97.5 F (36.4 C) 98.3 F (36.8 C)  SpO2: 98% 100%    General: AAOx3 Cardiovascular: S1S2/RRR Respiratory: CTAB  Discharge Instructions    Allergies as of 01/27/2018   No Known Allergies     Medication List    TAKE these medications   allopurinol 300 MG tablet Commonly known as:  ZYLOPRIM Take 450 mg by mouth every evening.   metoprolol tartrate 25 MG tablet Commonly known as:  LOPRESSOR Take 0.5 tablets (12.5 mg total) by mouth 2 (two) times daily. What changed:  how much to take      No Known Allergies    The results of significant diagnostics from this hospitalization (including imaging, microbiology, ancillary and laboratory) are listed below for reference.    Significant Diagnostic Studies: Dg Chest 2 View  Result Date: 01/25/2018 CLINICAL DATA:  Chest heaviness, intermittent pressure over the last 3 weeks, some shortness of breath and nausea EXAM: CHEST - 2 VIEW COMPARISON:  None. FINDINGS: No active infiltrate or effusion is seen. Mediastinal and hilar contours are unremarkable. The heart may be borderline enlarged. There  is a small hiatal hernia noted. No acute bony abnormality is seen. IMPRESSION: 1. No active cardiopulmonary disease. 2. Borderline cardiomegaly. 3. Small hiatal hernia. Electronically Signed   By: Ivar Drape M.D.   On:  01/25/2018 16:26    Microbiology: No results found for this or any previous visit (from the past 240 hour(s)).   Labs: Basic Metabolic Panel: No results for input(s): NA, K, CL, CO2, GLUCOSE, BUN, CREATININE, CALCIUM, MG, PHOS in the last 168 hours. Liver Function Tests: No results for input(s): AST, ALT, ALKPHOS, BILITOT, PROT, ALBUMIN in the last 168 hours. No results for input(s): LIPASE, AMYLASE in the last 168 hours. No results for input(s): AMMONIA in the last 168 hours. CBC: No results for input(s): WBC, NEUTROABS, HGB, HCT, MCV, PLT in the last 168 hours. Cardiac Enzymes: No results for input(s): CKTOTAL, CKMB, CKMBINDEX, TROPONINI in the last 168 hours. BNP: BNP (last 3 results) No results for input(s): BNP in the last 8760 hours.  ProBNP (last 3 results) No results for input(s): PROBNP in the last 8760 hours.  CBG: No results for input(s): GLUCAP in the last 168 hours.     Signed:  Domenic Polite MD.  [facility] 02/09/2018, 4:33 PM

## 2018-03-01 ENCOUNTER — Ambulatory Visit: Payer: BLUE CROSS/BLUE SHIELD | Admitting: Cardiovascular Disease

## 2018-03-04 ENCOUNTER — Ambulatory Visit: Payer: BLUE CROSS/BLUE SHIELD | Admitting: Cardiovascular Disease

## 2018-03-04 ENCOUNTER — Encounter: Payer: Self-pay | Admitting: Cardiovascular Disease

## 2018-03-04 ENCOUNTER — Telehealth: Payer: Self-pay | Admitting: Cardiovascular Disease

## 2018-03-04 DIAGNOSIS — I712 Thoracic aortic aneurysm, without rupture, unspecified: Secondary | ICD-10-CM

## 2018-03-04 DIAGNOSIS — I208 Other forms of angina pectoris: Secondary | ICD-10-CM

## 2018-03-04 DIAGNOSIS — I7121 Aneurysm of the ascending aorta, without rupture: Secondary | ICD-10-CM

## 2018-03-04 DIAGNOSIS — I719 Aortic aneurysm of unspecified site, without rupture: Secondary | ICD-10-CM

## 2018-03-04 NOTE — Patient Instructions (Signed)
Medication Instructions:  Your physician recommends that you continue on your current medications as directed. Please refer to the Current Medication list given to you today.  If you need a refill on your cardiac medications before your next appointment, please call your pharmacy.   Lab work: Your physician recommends that you return for lab work in: 1 week BEFORE scheduled test  If you have labs (blood work) drawn today and your tests are completely normal, you will receive your results only by: Marland Kitchen MyChart Message (if you have MyChart) OR . A paper copy in the mail If you have any lab test that is abnormal or we need to change your treatment, we will call you to review the results.  Testing/Procedures: Non-Cardiac CT Angiography (CTA), is a special type of CT scan that uses a computer to produce multi-dimensional views of major blood vessels throughout the body. In CT angiography, a contrast material is injected through an IV to help visualize the blood vessels  Schedule in 06/2018  Follow-Up: At [facility], you and your health needs are our priority.  As part of our continuing mission to provide you with exceptional heart care, we have created designated Provider Care Teams.  These Care Teams include your primary Cardiologist (physician) and Advanced Practice Providers (APPs -  Physician Assistants and Nurse Practitioners) who all work together to provide you with the care you need, when you need it. You will need a follow up appointment in 6 months.  Please call our office 2 months in advance to schedule this appointment.  You may see  DR. BERRY or one of the following Advanced Practice Providers on your designated Care Team:   Kerin Ransom, PA-C [place], [place] . Sande Rives, PA-C  Any Other Special Instructions Will Be Listed Below (If Applicable).

## 2018-03-04 NOTE — Telephone Encounter (Signed)
Faxed request for medical records from [facility] Dr. Orpah Greek in [place].  03/04/18  LM

## 2018-03-04 NOTE — Progress Notes (Signed)
03/04/2018 Marvin Harvey   1956/03/08  [id number]  Primary Physician Sherrilee Gilles, DO Primary Cardiologist: Lorretta Harp MD Lupe Carney, [place]  HPI:  Marvin Harvey is a 62 y.o. mild to moderately overweight married Caucasian male father of 68, grandfather of 2 grandchildren who is a Engineering geologist and is accompanied by his wife Marvin Harvey today.  He is being seen for his first post hospital outpatient follow-up after being admitted 01/27/2018 in transfer from [facility] for chest pain.  He was seen by cardiologist there and is had his aortic valve followed by echo periodically.  He recently had a CTA that showed a 5.2 cm aortic aneurysm.  His father apparently also had a thoracic aortic aneurysm and had this repaired along with an AVR in his 47s.  He has no other cardiac risk factors.   Current Meds  Medication Sig  . allopurinol (ZYLOPRIM) 300 MG tablet Take 450 mg by mouth every evening.   . metoprolol tartrate (LOPRESSOR) 25 MG tablet Take 0.5 tablets (12.5 mg total) by mouth 2 (two) times daily.     No Known Allergies  Social History   Socioeconomic History  . Marital status: Married    Spouse name: Not on file  . Number of children: Not on file  . Years of education: Not on file  . Highest education level: Not on file  Occupational History  . Not on file  Social Needs  . Financial resource strain: Not on file  . Food insecurity:    Worry: Not on file    Inability: Not on file  . Transportation needs:    Medical: Not on file    Non-medical: Not on file  Tobacco Use  . Smoking status: Never Smoker  . Smokeless tobacco: Never Used  Substance and Sexual Activity  . Alcohol use: Never    Frequency: Never  . Drug use: Never  . Sexual activity: Not on file  Lifestyle  . Physical activity:    Days per week: Not on file    Minutes per session: Not on file  . Stress: Not on file  Relationships  . Social connections:    Talks on phone: Not on file    Gets  together: Not on file    Attends religious service: Not on file    Active member of club or organization: Not on file    Attends meetings of clubs or organizations: Not on file    Relationship status: Not on file  . Intimate partner violence:    Fear of current or ex partner: Not on file    Emotionally abused: Not on file    Physically abused: Not on file    Forced sexual activity: Not on file  Other Topics Concern  . Not on file  Social History Narrative  . Not on file     Review of Systems: General: negative for chills, fever, night sweats or weight changes.  Cardiovascular: negative for chest pain, dyspnea on exertion, edema, orthopnea, palpitations, paroxysmal nocturnal dyspnea or shortness of breath Dermatological: negative for rash Respiratory: negative for cough or wheezing Urologic: negative for hematuria Abdominal: negative for nausea, vomiting, diarrhea, bright red blood per rectum, melena, or hematemesis Neurologic: negative for visual changes, syncope, or dizziness All other systems reviewed and are otherwise negative except as noted above.    Blood pressure 128/82, pulse 60, height 5\' 11"  (1.803 m), weight 216 lb (98 kg), SpO2 100 %.  General appearance: alert  and no distress Neck: no adenopathy, no carotid bruit, no JVD, supple, symmetrical, trachea midline and thyroid not enlarged, symmetric, no tenderness/mass/nodules Lungs: clear to auscultation bilaterally Heart: regular rate and rhythm, S1, S2 normal, no murmur, click, rub or gallop Extremities: extremities normal, atraumatic, no cyanosis or edema Pulses: 2+ and symmetric Skin: Skin color, texture, turgor normal. No rashes or lesions Neurologic: Alert and oriented X 3, normal strength and tone. Normal symmetric reflexes. Normal coordination and gait  EKG not performed today  ASSESSMENT AND PLAN:   Aortic root aneurysm [facility]) Mr. Hinderman has a 5.2 cm ascending thoracic aortic aneurysm demonstrated by CT scan  in [place].  He has seen Dr. Roxan Hockey for consultation.  I am going to repeat a chest CT CTA in 6 months.  Chest pain Mr. Gauthreaux was admitted in transfer from [facility] with atypical chest pain.  I performed femoral diagnostic cath on him 01/26/2018 which was entirely normal.  I MYNX closed his femoral puncture site.  He has had no recurrent symptoms.      Lorretta Harp MD FACP,FACC,FAHA, [facility] 03/04/2018 9:24 AM

## 2018-03-04 NOTE — Assessment & Plan Note (Signed)
[name] has a 5.2 cm ascending thoracic aortic aneurysm demonstrated by CT scan in [place].  He has seen Dr. Roxan Hockey for consultation.  I am going to repeat a chest CT CTA in 6 months.

## 2018-03-04 NOTE — Assessment & Plan Note (Signed)
[name] was admitted in transfer from [facility] with atypical chest pain.  I performed femoral diagnostic cath on him 01/26/2018 which was entirely normal.  I MYNX closed his femoral puncture site.  He has had no recurrent symptoms.

## 2018-03-25 ENCOUNTER — Encounter: Payer: Self-pay | Admitting: Gastroenterology

## 2018-04-14 ENCOUNTER — Ambulatory Visit (AMBULATORY_SURGERY_CENTER): Payer: Self-pay

## 2018-04-14 VITALS — Ht 71.0 in | Wt 216.4 lb

## 2018-04-14 DIAGNOSIS — Z1211 Encounter for screening for malignant neoplasm of colon: Secondary | ICD-10-CM

## 2018-04-14 MED ORDER — NA SULFATE-K SULFATE-MG SULF 17.5-3.13-1.6 GM/177ML PO SOLN: 1.0000 | Freq: Once | ORAL | 0 refills | Status: AC

## 2018-04-14 NOTE — Progress Notes (Signed)
Denies allergies to eggs or soy products. Denies complication of anesthesia or sedation. Denies use of weight loss medication. Denies use of O2.   Emmi instructions declined.    A sample of Suprep was given to the patient.  

## 2018-04-18 ENCOUNTER — Encounter: Payer: Self-pay | Admitting: Gastroenterology

## 2018-05-02 ENCOUNTER — Ambulatory Visit (AMBULATORY_SURGERY_CENTER): Payer: BLUE CROSS/BLUE SHIELD | Admitting: Gastroenterology

## 2018-05-02 ENCOUNTER — Encounter: Payer: Self-pay | Admitting: Gastroenterology

## 2018-05-02 VITALS — BP 139/83 | HR 58 | Temp 98.4°F | Resp 14 | Ht 71.0 in | Wt 216.0 lb

## 2018-05-02 DIAGNOSIS — D120 Benign neoplasm of cecum: Secondary | ICD-10-CM | POA: Diagnosis not present

## 2018-05-02 DIAGNOSIS — D125 Benign neoplasm of sigmoid colon: Secondary | ICD-10-CM | POA: Diagnosis not present

## 2018-05-02 DIAGNOSIS — D122 Benign neoplasm of ascending colon: Secondary | ICD-10-CM | POA: Diagnosis not present

## 2018-05-02 DIAGNOSIS — Z1211 Encounter for screening for malignant neoplasm of colon: Secondary | ICD-10-CM | POA: Diagnosis not present

## 2018-05-02 DIAGNOSIS — D123 Benign neoplasm of transverse colon: Secondary | ICD-10-CM

## 2018-05-02 DIAGNOSIS — K635 Polyp of colon: Secondary | ICD-10-CM

## 2018-05-02 MED ORDER — SODIUM CHLORIDE 0.9 % IV SOLN: 500.0000 mL | Freq: Once | INTRAVENOUS | Status: DC

## 2018-05-02 NOTE — Progress Notes (Signed)
Called to room to assist during endoscopic procedure.  Patient ID and intended procedure confirmed with present staff. Received instructions for my participation in the procedure from the performing physician.  

## 2018-05-02 NOTE — Progress Notes (Signed)
Pt's states no medical or surgical changes since previsit or office visit. 

## 2018-05-02 NOTE — Patient Instructions (Signed)
  Information on polyps ,diverticulosis,& hemorrhoids given to you today  Await pathology on polyps removed   Resume usual diet & medications   YOU HAD AN ENDOSCOPIC PROCEDURE TODAY AT [place]:   Refer to the procedure report that was given to you for any specific questions about what was found during the examination.  If the procedure report does not answer your questions, please call your gastroenterologist to clarify.  If you requested that your care partner not be given the details of your procedure findings, then the procedure report has been included in a sealed envelope for you to review at your convenience later.  YOU SHOULD EXPECT: Some feelings of bloating in the abdomen. Passage of more gas than usual.  Walking can help get rid of the air that was put into your GI tract during the procedure and reduce the bloating. If you had a lower endoscopy (such as a colonoscopy or flexible sigmoidoscopy) you may notice spotting of blood in your stool or on the toilet paper. If you underwent a bowel prep for your procedure, you may not have a normal bowel movement for a few days.  Please Note:  You might notice some irritation and congestion in your nose or some drainage.  This is from the oxygen used during your procedure.  There is no need for concern and it should clear up in a day or so.  SYMPTOMS TO REPORT IMMEDIATELY:   Following lower endoscopy (colonoscopy or flexible sigmoidoscopy):  Excessive amounts of blood in the stool  Significant tenderness or worsening of abdominal pains  Swelling of the abdomen that is new, acute  Fever of 100F or higher    For urgent or emergent issues, a gastroenterologist can be reached at any hour by calling [phone].   DIET:  We do recommend a small meal at first, but then you may proceed to your regular diet.  Drink plenty of fluids but you should avoid alcoholic beverages for 24 hours.  ACTIVITY:  You should plan to take  it easy for the rest of today and you should NOT DRIVE or use heavy machinery until tomorrow (because of the sedation medicines used during the test).    FOLLOW UP: Our staff will call the number listed on your records the next business day following your procedure to check on you and address any questions or concerns that you may have regarding the information given to you following your procedure. If we do not reach you, we will leave a message.  However, if you are feeling well and you are not experiencing any problems, there is no need to return our call.  We will assume that you have returned to your regular daily activities without incident.  If any biopsies were taken you will be contacted by phone or by letter within the next 1-3 weeks.  Please call us at [phone] if you have not heard about the biopsies in 3 weeks.    SIGNATURES/CONFIDENTIALITY: You and/or your care partner have signed paperwork which will be entered into your electronic medical record.  These signatures attest to the fact that that the information above on your After Visit Summary has been reviewed and is understood.  Full responsibility of the confidentiality of this discharge information lies with you and/or your care-partner.

## 2018-05-02 NOTE — Op Note (Signed)
[place] Patient Name: Marvin Harvey Procedure Date: 05/02/2018 12:52 PM MRN: [id number] Endoscopist: Mauri Pole , MD Age: 62 Referring MD:  Date of Birth: [birth year] Gender: Male Account #: [id number] Procedure:                Colonoscopy Indications:              Screening for colorectal malignant neoplasm Medicines:                Monitored Anesthesia Care Procedure:                Pre-Anesthesia Assessment:                           - Prior to the procedure, a History and Physical                            was performed, and patient medications and                            allergies were reviewed. The patient's tolerance of                            previous anesthesia was also reviewed. The risks                            and benefits of the procedure and the sedation                            options and risks were discussed with the patient.                            All questions were answered, and informed consent                            was obtained. Prior Anticoagulants: The patient has                            taken no previous anticoagulant or antiplatelet                            agents. ASA Grade Assessment: II - A patient with                            mild systemic disease. After reviewing the risks                            and benefits, the patient was deemed in                            satisfactory condition to undergo the procedure.                           After obtaining informed consent, the colonoscope  was passed under direct vision. Throughout the                            procedure, the patient's blood pressure, pulse, and                            oxygen saturations were monitored continuously. The                            Colonoscope was introduced through the anus and                            advanced to the the cecum, identified by                            appendiceal orifice and  ileocecal valve. The                            colonoscopy was performed without difficulty. The                            patient tolerated the procedure well. The quality                            of the bowel preparation was good. The ileocecal                            valve, appendiceal orifice, and rectum were                            photographed. Scope In: 1:16:07 PM Scope Out: 1:38:40 PM Scope Withdrawal Time: 0 hours 17 minutes 19 seconds  Total Procedure Duration: 0 hours 22 minutes 33 seconds  Findings:                 The perianal and digital rectal examinations were                            normal.                           Two sessile polyps were found in the cecum. The                            polyps were 1 to 2 mm in size. These polyps were                            removed with a cold biopsy forceps. Resection and                            retrieval were complete.                           Seven sessile polyps were found in the rectum,  sigmoid colon, transverse colon and ascending                            colon. The polyps were 3 to 8 mm in size. These                            polyps were removed with a cold snare. Resection                            and retrieval were complete.                           Scattered small-mouthed diverticula were found in                            the sigmoid colon and descending colon.                           Non-bleeding internal hemorrhoids were found during                            retroflexion. The hemorrhoids were medium-sized. Complications:            No immediate complications. Estimated Blood Loss:     Estimated blood loss was minimal. Impression:               - Two 1 to 2 mm polyps in the cecum, removed with a                            cold biopsy forceps. Resected and retrieved.                           - Seven 3 to 8 mm polyps in the rectum, in the                             sigmoid colon, in the transverse colon and in the                            ascending colon, removed with a cold snare.                            Resected and retrieved.                           - Moderate diverticulosis in the sigmoid colon and                            in the descending colon.                           - Non-bleeding internal hemorrhoids. Recommendation:           - Patient has a contact number available for  emergencies. The signs and symptoms of potential                            delayed complications were discussed with the                            patient. Return to normal activities tomorrow.                            Written discharge instructions were provided to the                            patient.                           - Resume previous diet.                           - Continue present medications.                           - Await pathology results.                           - Repeat colonoscopy in 3 - 5 years for                            surveillance based on pathology results. Mauri Pole, MD 05/02/2018 1:46:44 PM This report has been signed electronically.

## 2018-05-02 NOTE — Progress Notes (Signed)
PT taken to PACU. Monitors in place. VSS. Report given to RN. 

## 2018-05-03 ENCOUNTER — Telehealth: Payer: Self-pay

## 2018-05-03 ENCOUNTER — Telehealth: Payer: Self-pay | Admitting: *Deleted

## 2018-05-03 NOTE — Telephone Encounter (Signed)
NO ANSWER, MESSAGE LEFT FOR PATIENT. 

## 2018-05-03 NOTE — Telephone Encounter (Signed)
  Follow up Call-  Call back number 05/02/2018  Post procedure Call Back phone  # [phone]  Permission to leave phone message Yes  Some recent data might be hidden     Patient questions:  Do you have a fever, pain , or abdominal swelling? No. Pain Score  0 *  Have you tolerated food without any problems? Yes.    Have you been able to return to your normal activities? Yes.    Do you have any questions about your discharge instructions: Diet   No. Medications  No. Follow up visit  No.  Do you have questions or concerns about your Care? Yes.   Pt continued to have watery bm's after going home and during night.  Feels like it is now slowing down and no trips to bathroom last few hours today. Will contact us if it doesn't quit today or tomorrow.  Actions: * If pain score is 4 or above: No action needed, pain <4.

## 2018-05-09 ENCOUNTER — Encounter: Payer: Self-pay | Admitting: Gastroenterology

## 2018-08-02 ENCOUNTER — Ambulatory Visit: Payer: BLUE CROSS/BLUE SHIELD | Admitting: Thoracic Surgery (Cardiothoracic Vascular Surgery)

## 2018-08-02 ENCOUNTER — Other Ambulatory Visit: Payer: Self-pay

## 2018-08-02 ENCOUNTER — Other Ambulatory Visit: Payer: Self-pay | Admitting: *Deleted

## 2018-08-02 ENCOUNTER — Encounter: Payer: Self-pay | Admitting: Thoracic Surgery (Cardiothoracic Vascular Surgery)

## 2018-08-02 ENCOUNTER — Ambulatory Visit
Admission: RE | Admit: 2018-08-02 | Discharge: 2018-08-02 | Disposition: A | Discharge status: Home / Self Care | Payer: BLUE CROSS/BLUE SHIELD | Source: Ambulatory Visit | Attending: Cardiovascular Disease | Admitting: Cardiovascular Disease

## 2018-08-02 VITALS — BP 130/73 | HR 60 | Resp 16 | Ht 71.0 in | Wt 206.0 lb

## 2018-08-02 DIAGNOSIS — I712 Thoracic aortic aneurysm, without rupture, unspecified: Secondary | ICD-10-CM

## 2018-08-02 DIAGNOSIS — I7121 Aneurysm of the ascending aorta, without rupture: Secondary | ICD-10-CM

## 2018-08-02 MED ORDER — IOPAMIDOL (ISOVUE-370) INJECTION 76%
75.0000 mL | Freq: Once | INTRAVENOUS | Status: AC | PRN
  Administered 2018-08-02: 75 mL via INTRAVENOUS

## 2018-08-02 NOTE — Progress Notes (Signed)
Marvin Harvey       Marvin Harvey,Marvin Harvey [address]             [phone]     HPI: [name] returns for a scheduled follow-up visit  Marvin Harvey is a 63 year old gentleman with a history of gastroesophageal reflux, hiatal hernia, Nissen fundoplication, and "aortic root aneurysm."  He was hospitalized in the fall with left-sided chest pain and pressure accompanied by shortness of breath.  It would last from 5 to 30 minutes.  His cardiac work-up was negative.  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  He was told it was 5.2 cm by a CT in [place].  We did not have that in our system.  He returns for a six-month follow-up.  He continues to have some of the left-sided chest discomfort accompanied with shortness of breath.  It is not quite as frequent as it had been previously.  He has not had any anterior or substernal chest pain or pressure.  Past Medical History:  Diagnosis Date  . Allergy   . 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 Arthritis   . GERD (gastroesophageal reflux disease)   . History of repair of hiatal hernia 2001  . Sleep apnea    On CPAP    Current Outpatient Medications  Medication Sig Dispense Refill  . allopurinol (ZYLOPRIM) 300 MG tablet Take 450 mg by mouth every evening.   1  . aspirin EC 81 MG tablet Take 81 mg by mouth daily.    . fexofenadine (ALLEGRA) 60 MG tablet Take 60 mg by mouth as needed for allergies or rhinitis.    . metoprolol tartrate (LOPRESSOR) 25 MG tablet Take 0.5 tablets (12.5 mg total) by mouth 2 (two) times daily. 30 tablet 0  . OVER THE COUNTER MEDICATION Vitamin B 12, 1000 mcg one tablet daily.    Marland Kitchen OVER THE COUNTER MEDICATION Vitamin D 3 one capsule daily.    Marland Kitchen OVER THE COUNTER MEDICATION Nasocort nasal spray, as needed for allergies.     No current facility-administered medications for this visit.     Physical Exam BP 130/73 (BP Location: Right Arm, Patient Position: Sitting, Cuff Size:  Large)   Pulse 60   Resp 16   Ht 5\' 11"  (1.803 m)   Wt 206 lb (93.4 kg)   SpO2 96% Comment: RA  BMI 28.68 kg/m  63 year old man in no acute distress Alert and oriented x 3 with no focal deficits No carotid bruits Cardiac regular rate and rhythm normal S1 and S2 Lungs clear with equal breath sounds bilaterally No peripheral edema  Diagnostic Tests: CT ANGIOGRAPHY CHEST WITH CONTRAST  TECHNIQUE: Multidetector CT imaging of the chest was performed using the standard protocol during bolus administration of intravenous contrast. Multiplanar CT image reconstructions and MIPs were obtained to evaluate the vascular anatomy.  CONTRAST:  3mL ISOVUE-370 IOPAMIDOL (ISOVUE-370) INJECTION 76%  COMPARISON:  None.  FINDINGS: Cardiovascular: Ascending thoracic aorta measures 42 mm in diameter (image 100/12; image 70/5). Descending thoracic aorta is normal caliber at 26 mm.  Great vessels are normal. No atherosclerotic calcification of the aorta.  Normal heart.  No pericardial effusion.  Mediastinum/Nodes: No axillary or supraclavicular adenopathy. No mediastinal adenopathy. Moderate hiatal hernia.  Lungs/Pleura: No pulmonary nodularity.  Airways normal.  Upper Abdomen: Limited view of the liver, kidneys, pancreas are unremarkable. Normal adrenal glands.  Musculoskeletal: No aggressive osseous lesion  Review of  the MIP images confirms the above findings.  IMPRESSION: 1. Mild aneurysmal dilatation of the ascending thoracic aorta. Recommend annual imaging followup by CTA or MRA. This recommendation follows 2010 ACCF/AHA/AATS/ACR/ASA/SCA/SCAI/SIR/STS/SVM Guidelines for the Diagnosis and Management of Patients with Thoracic Aortic Disease. Circulation. 2010; 121: T465-K812. Aortic aneurysm NOS (ICD10-I71.9) , 2. Moderate hiatal hernia.   Electronically Signed   By: Marvin Harvey M.D.   On: 08/02/2018 10:36 I personally reviewed the CT images and concur with  the findings noted above  Impression: [name] is a 63 year old gentleman with a history of reflux and a previous Nissen fundoplication who presented back in the fall with left-sided chest pain.  He had a CT of the chest which showed a reported 5.2 cm ascending aneurysm.  He now returns for 22-month follow-up visit.  His left-sided chest pain persists although it is not as frequent or severe as he was having back in the fall.  Ascending aneurysm-his ascending aorta measures about 4.2 cm to 4.3 cm in the midportion and about 4.7 cm at the sinuses of Valsalva.  This is a small ascending aneurysm.  Not nearly as large as previously reported.  This does need follow-up but can be done on an annual basis until it exceeds 4.5 cm.  Chest discomfort-CT does show hiatal hernia with an Nissen wrap in the patient's chest.  He should be evaluated by gastroenterology since no cardiac source is ever been found.  Plan: Follow-up with gastroenterology regarding possible recurrent hiatal hernia Return in 1 year with CT angiogram of chest  Melrose Nakayama, MD [facility] [phone]

## 2018-08-05 ENCOUNTER — Other Ambulatory Visit: Payer: Self-pay

## 2018-08-05 ENCOUNTER — Telehealth: Payer: Self-pay

## 2018-08-05 DIAGNOSIS — I712 Thoracic aortic aneurysm, without rupture, unspecified: Secondary | ICD-10-CM

## 2018-08-11 ENCOUNTER — Other Ambulatory Visit: Payer: Self-pay

## 2018-08-11 DIAGNOSIS — I712 Thoracic aortic aneurysm, without rupture, unspecified: Secondary | ICD-10-CM

## 2018-08-18 NOTE — Telephone Encounter (Signed)
Encounter closed

## 2018-09-15 ENCOUNTER — Other Ambulatory Visit: Payer: Self-pay | Admitting: Cardiovascular Disease

## 2018-09-16 LAB — BASIC METABOLIC PANEL
BUN/Creatinine Ratio: 13 (ref 10–24)
BUN: 13 mg/dL (ref 8–27)
CO2: 23 mmol/L (ref 20–29)
Calcium: 9.4 mg/dL (ref 8.6–10.2)
Chloride: 104 mmol/L (ref 96–106)
Creatinine, Ser: 1.01 mg/dL (ref 0.76–1.27)
GFR calc Af Amer: 91 mL/min/{1.73_m2} (ref >59)
GFR calc non Af Amer: 79 mL/min/{1.73_m2} (ref >59)
Glucose: 95 mg/dL (ref 65–99)
Potassium: 4.1 mmol/L (ref 3.5–5.2)
Sodium: 142 mmol/L (ref 134–144)

## 2018-09-20 ENCOUNTER — Telehealth: Payer: Self-pay | Admitting: *Deleted

## 2018-09-20 ENCOUNTER — Encounter: Payer: Self-pay | Admitting: *Deleted

## 2018-09-20 ENCOUNTER — Telehealth: Payer: Self-pay | Admitting: Cardiovascular Disease

## 2018-09-20 NOTE — Telephone Encounter (Signed)
Virtual Visit Pre-Appointment Phone Call  "(Name), I am calling you today to discuss your upcoming appointment. We are currently trying to limit exposure to the virus that causes COVID-19 by seeing patients at home rather than in the office."  1. "What is the BEST phone number to call the day of the visit?" - include this in appointment notes  2. Do you have or have access to (through a family member/friend) a smartphone with video capability that we can use for your visit?" a. If yes - list this number in appt notes as cell (if different from BEST phone #) and list the appointment type as a VIDEO visit in appointment notes b. If no - list the appointment type as a PHONE visit in appointment notes  3. Confirm consent - "In the setting of the current Covid19 crisis, you are scheduled for a (phone or video) visit with your provider on (date) at (time).  Just as we do with many in-office visits, in order for you to participate in this visit, we must obtain consent.  If you'd like, I can send this to your mychart (if signed up) or email for you to review.  Otherwise, I can obtain your verbal consent now.  All virtual visits are billed to your insurance company just like a normal visit would be.  By agreeing to a virtual visit, we'd like you to understand that the technology does not allow for your provider to perform an examination, and thus may limit your provider's ability to fully assess your condition. If your provider identifies any concerns that need to be evaluated in person, we will make arrangements to do so.  Finally, though the technology is pretty good, we cannot assure that it will always work on either your or our end, and in the setting of a video visit, we may have to convert it to a phone-only visit.  In either situation, we cannot ensure that we have a secure connection.  Are you willing to proceed?" STAFF: Did the patient verbally acknowledge consent to telehealth visit? Document  YES/NO here: YES  4. Advise patient to be prepared - "Two hours prior to your appointment, go ahead and check your blood pressure, pulse, oxygen saturation, and your weight (if you have the equipment to check those) and write them all down. When your visit starts, your provider will ask you for this information. If you have an Apple Watch or Kardia device, please plan to have heart rate information ready on the day of your appointment. Please have a pen and paper handy nearby the day of the visit as well."  5. Give patient instructions for MyChart download to smartphone OR Doximity/Doxy.me as below if video visit (depending on what platform provider is using)  6. Inform patient they will receive a phone call 15 minutes prior to their appointment time (may be from unknown caller ID) so they should be prepared to answer    TELEPHONE CALL NOTE  Messi Twedt has been deemed a candidate for a follow-up tele-health visit to limit community exposure during the Covid-19 pandemic. I spoke with the patient via phone to ensure availability of phone/video source, confirm preferred email & phone number, and discuss instructions and expectations.  I reminded Tyrik Stetzer to be prepared with any vital sign and/or heart rhythm information that could potentially be obtained via home monitoring, at the time of his visit. I reminded Porfirio Bollier to expect a phone call prior to his visit.  Venetia Maxon, [place] 09/20/2018 9:38 AM   INSTRUCTIONS FOR DOWNLOADING THE MYCHART APP TO SMARTPHONE  - The patient must first make sure to have activated MyChart and know their login information - If Apple, go to [employer] and type in MyChart in the search bar and download the app. If Android, ask patient to go to [employer] and type in [place] in the search bar and download the app. The app is free but as with any other app downloads, their phone may require them to verify saved payment information or Apple/Android  password.  - The patient will need to then log into the app with their MyChart username and password, and select [facility] as their healthcare provider to link the account. When it is time for your visit, go to the MyChart app, find appointments, and click Begin Video Visit. Be sure to Select Allow for your device to access the Microphone and Camera for your visit. You will then be connected, and your provider will be with you shortly.  **If they have any issues connecting, or need assistance please contact MyChart service desk [phone])**  **If using a computer, in order to ensure the best quality for their visit they will need to use either of the following Internet Browsers: [employer], or Google Chrome**  IF USING DOXIMITY or DOXY.ME - The patient will receive a link just prior to their visit by text.     FULL LENGTH CONSENT FOR TELE-HEALTH VISIT   I hereby voluntarily request, consent and authorize [facility] and its employed or contracted physicians, physician assistants, nurse practitioners or other licensed health care professionals (the Practitioner), to provide me with telemedicine health care services (the Services") as deemed necessary by the treating Practitioner. I acknowledge and consent to receive the Services by the Practitioner via telemedicine. I understand that the telemedicine visit will involve communicating with the Practitioner through live audiovisual communication technology and the disclosure of certain medical information by electronic transmission. I acknowledge that I have been given the opportunity to request an in-person assessment or other available alternative prior to the telemedicine visit and am voluntarily participating in the telemedicine visit.  I understand that I have the right to withhold or withdraw my consent to the use of telemedicine in the course of my care at any time, without affecting my right to future care or treatment,  and that the Practitioner or I may terminate the telemedicine visit at any time. I understand that I have the right to inspect all information obtained and/or recorded in the course of the telemedicine visit and may receive copies of available information for a reasonable fee.  I understand that some of the potential risks of receiving the Services via telemedicine include:   Delay or interruption in medical evaluation due to technological equipment failure or disruption;  Information transmitted may not be sufficient (e.g. poor resolution of images) to allow for appropriate medical decision making by the Practitioner; and/or   In rare instances, security protocols could fail, causing a breach of personal health information.  Furthermore, I acknowledge that it is my responsibility to provide information about my medical history, conditions and care that is complete and accurate to the best of my ability. I acknowledge that Practitioner's advice, recommendations, and/or decision may be based on factors not within their control, such as incomplete or inaccurate data provided by me or distortions of diagnostic images or specimens that may result from electronic transmissions. I understand that  the practice of medicine is not an exact science and that Practitioner makes no warranties or guarantees regarding treatment outcomes. I acknowledge that I will receive a copy of this consent concurrently upon execution via email to the email address I last provided but may also request a printed copy by calling the office of [facility].    I understand that my insurance will be billed for this visit.   I have read or had this consent read to me.  I understand the contents of this consent, which adequately explains the benefits and risks of the Services being provided via telemedicine.   I have been provided ample opportunity to ask questions regarding this consent and the Services and have had my questions  answered to my satisfaction.  I give my informed consent for the services to be provided through the use of telemedicine in my medical care  By participating in this telemedicine visit I agree to the above.      Cardiac Questionnaire:    Since your last visit or hospitalization:    1. Have you been having new or worsening chest pain? YES   2. Have you been having new or worsening shortness of breath? YES 3. Have you been having new or worsening leg swelling, wt gain, or increase in abdominal girth (pants fitting more tightly)? NO   4. Have you had any passing out spells? NO    *A YES to any of these questions would result in the appointment being kept. *If all the answers to these questions are NO, we should indicate that given the current situation regarding the worldwide coronarvirus pandemic, at the recommendation of the CDC, we are looking to limit gatherings in our waiting area, and thus will reschedule their appointment beyond four weeks from today.   _____________   COVID-19 Pre-Screening Questions:   Do you currently have a fever? NO  Have you recently travelled on a cruise, internationally, or to [place], [place], [place], [place], [place], or [place], [place] [employer]) ? NO  Have you been in contact with someone that is currently pending confirmation of Covid19 testing or has been confirmed to have the [place] virus?  NO  Are you currently experiencing fatigue or cough? NO

## 2018-09-21 ENCOUNTER — Telehealth: Payer: Self-pay

## 2018-09-21 ENCOUNTER — Other Ambulatory Visit: Payer: Self-pay

## 2018-09-21 ENCOUNTER — Telehealth (INDEPENDENT_AMBULATORY_CARE_PROVIDER_SITE_OTHER): Payer: BLUE CROSS/BLUE SHIELD | Admitting: Cardiovascular Disease

## 2018-09-21 VITALS — BP 116/78 | HR 67 | Ht 71.0 in | Wt 208.0 lb

## 2018-09-21 DIAGNOSIS — I719 Aortic aneurysm of unspecified site, without rupture: Secondary | ICD-10-CM

## 2018-09-21 DIAGNOSIS — Z13220 Encounter for screening for lipoid disorders: Secondary | ICD-10-CM

## 2018-09-21 DIAGNOSIS — I7121 Aneurysm of the ascending aorta, without rupture: Secondary | ICD-10-CM

## 2018-09-21 NOTE — Patient Instructions (Signed)
Medication Instructions:  Your physician recommends that you continue on your current medications as directed. Please refer to the Current Medication list given to you today.  If you need a refill on your cardiac medications before your next appointment, please call your pharmacy.   Lab work: Your physician recommends that you return for lab work:  FASTING LIPID PROFILE AND LIVER FUNCTION TEST. YOU WILL RECEIVE A LAB SLIP IN THE MAIL. PLEASE DO NOT EAT OR DRINK (EXCEPT WATER) ANYTHING AFTER MIDNIGHT ON THE DAY YOU CHOOSE TO PRESENT FOR LAB WORK. YOU MAY EAT AFTER YOUR BLOOD HAS BEEN COLLECTED. NO APPOINTMENT IS NEEDED. YOU MAY PRESENT FOR LAB WORK AT A LAB CORP NEAR YOU.  If you have labs (blood work) drawn today and your tests are completely normal, you will receive your results only by: Marland Kitchen MyChart Message (if you have MyChart) OR . A paper copy in the mail If you have any lab test that is abnormal or we need to change your treatment, we will call you to review the results.  Testing/Procedures: NONE  Follow-Up: At [facility], you and your health needs are our priority.  As part of our continuing mission to provide you with exceptional heart care, we have created designated Provider Care Teams.  These Care Teams include your primary Cardiologist (physician) and Advanced Practice Providers (APPs -  Physician Assistants and Nurse Practitioners) who all work together to provide you with the care you need, when you need it. You will need a follow up appointment in 12 months with Dr. Gwenlyn Found.  Please call our office 2 months in advance to schedule this appointment.

## 2018-09-21 NOTE — Progress Notes (Signed)
The patient's wife, Terez Freimark who is on the patient's DPR has been notified of the result and verbalized understanding.  All questions (if any) were answered. Jacqulynn Cadet, [place] 09/21/2018 10:36 AM

## 2018-09-21 NOTE — Addendum Note (Signed)
Addended by: Annita Brod on: 09/21/2018 11:18 AM   Modules accepted: Orders

## 2018-09-21 NOTE — Telephone Encounter (Signed)
Patient and/or DPR-approved person aware of AVS instructions and verbalized understanding. Letter including After Visit Summary and any other necessary documents to be mailed to the patient's address on file.  

## 2018-09-21 NOTE — Progress Notes (Signed)
Virtual Visit via Video Note   This visit type was conducted due to national recommendations for restrictions regarding the COVID-19 Pandemic (e.g. social distancing) in an effort to limit this patient's exposure and mitigate transmission in our community.  Due to his co-morbid illnesses, this patient is at least at moderate risk for complications without adequate follow up.  This format is felt to be most appropriate for this patient at this time.  All issues noted in this document were discussed and addressed.  A limited physical exam was performed with this format.  Please refer to the patient's chart for his consent to telehealth for [facility].   Evaluation Performed:  Follow-up visit  Date:  09/21/2018   ID:  Marvin Harvey, Marvin Harvey 1955/11/15, MRN [id number]  Patient Location: Home Provider Location: Home  PCP:  Sherrilee Gilles, DO  Cardiologist: Dr. Quay Burow Electrophysiologist:  None   Chief Complaint: Thoracic aortic aneurysm  History of Present Illness:    Marvin Harvey is a 63 y.o. mild to moderately overweight married Caucasian male father of 78, grandfather of 2 grandchildren who is a Engineering geologist and was accompanied by his wife [name] when I saw him in the office last 03/04/2018. He was being seen for his first post hospital outpatient follow-up after being admitted 01/27/2018 in transfer from [facility] for chest pain.  He was seen by cardiologist there and is had his aortic valve followed by echo periodically.  He recently had a CTA that showed a 5.2 cm aortic aneurysm.  His father apparently also had a thoracic aortic aneurysm and had this repaired along with an AVR in his 55s.  He has no other cardiac risk factors.  Since I saw him back 6 months ago he is done well.  He gets occasional atypical chest pain and palpitations.  He does admit to being under a lot of stress and to drinking 16 to 20 ounces of coffee a day.  I did refer him to Dr. Roxan Hockey for surgical  evaluation of his thoracic aortic aneurysm which initially measured 52 mm but on recent CTA performed 08/02/2018 measured 42 mm.  This is continued to be followed as an outpatient.  The patient does not have symptoms concerning for COVID-19 infection (fever, chills, cough, or new shortness of breath).    Past Medical History:  Diagnosis Date  . Allergy   . 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 Arthritis   . GERD (gastroesophageal reflux disease)   . History of repair of hiatal hernia 2001  . Sleep apnea    On CPAP   Past Surgical History:  Procedure Laterality Date  . CLOSED REDUCTION HAND FRACTURE Right   . LEFT HEART CATH AND CORS/GRAFTS ANGIOGRAPHY N/A 01/26/2018   Procedure: LEFT HEART CATH AND CORS/GRAFTS ANGIOGRAPHY;  Surgeon: Lorretta Harp, MD;  Location: [facility];  Service: Cardiovascular;  Laterality: N/A;     Current Meds  Medication Sig  . allopurinol (ZYLOPRIM) 300 MG tablet Take 450 mg by mouth every evening.   Marland Kitchen aspirin EC 81 MG tablet Take 81 mg by mouth daily.  . fexofenadine (ALLEGRA) 60 MG tablet Take 60 mg by mouth as needed for allergies or rhinitis.  . metoprolol tartrate (LOPRESSOR) 25 MG tablet Take 0.5 tablets (12.5 mg total) by mouth 2 (two) times daily.  Marland Kitchen OVER THE COUNTER MEDICATION Vitamin B 12, 1000 mcg one tablet daily.  Marland Kitchen OVER THE COUNTER MEDICATION  Vitamin D 3 one capsule daily.  Marland Kitchen OVER THE COUNTER MEDICATION Nasocort nasal spray, as needed for allergies.     Allergies:   Patient has no known allergies.   Social History   Tobacco Use  . Smoking status: Never Smoker  . Smokeless tobacco: Never Used  Substance Use Topics  . Alcohol use: Never    Frequency: Never  . Drug use: Never     Family Hx: The patient's family history includes Atrial fibrillation in his mother; Coronary artery disease in his father; Transient ischemic attack in his mother. There is no history of Colon cancer, Esophageal  cancer, Rectal cancer, or Stomach cancer.  ROS:   Please see the history of present illness.     All other systems reviewed and are negative.   Prior CV studies:   The following studies were reviewed today:  Thoracic CTA performed 08/02/2018  Labs/Other Tests and Data Reviewed:    EKG:  No ECG reviewed.  Recent Labs: 01/27/2018: Hemoglobin 14.8; Platelets 142 09/15/2018: BUN 13; Creatinine, Ser 1.01; Potassium 4.1; Sodium 142   Recent Lipid Panel No results found for: CHOL, TRIG, HDL, CHOLHDL, LDLCALC, LDLDIRECT  Wt Readings from Last 3 Encounters:  09/21/18 208 lb (94.3 kg)  08/02/18 206 lb (93.4 kg)  05/02/18 216 lb (98 kg)     Objective:    Vital Signs:  BP 116/78   Pulse 67   Ht 5\' 11"  (1.803 m)   Wt 208 lb (94.3 kg)   BMI 29.01 kg/m    VITAL SIGNS:  reviewed GEN:  no acute distress RESPIRATORY:  normal respiratory effort, symmetric expansion NEURO:  alert and oriented x 3, no obvious focal deficit PSYCH:  normal affect  ASSESSMENT & PLAN:    1. Thoracic aortic aneurysm- history of thoracic aortic aneurysm measuring 42 mm by recent CTA performed 08/02/2018 followed by Dr. Roxan Hockey 2. Atypical chest pain- cardiac catheterization performed by myself 01/26/2018 was entirely normal 3. Palpitations- history of palpitations occurring weekly and/or monthly which he attributes to increase stress.  He does drink 16 to 20 ounces of coffee a day.  He is worn a monitor in the past without a diagnosis.  At this point, we will just follow him.  COVID-19 Education: The signs and symptoms of COVID-19 were discussed with the patient and how to seek care for testing (follow up with PCP or arrange E-visit).  The importance of social distancing was discussed today.  Time:   Today, I have spent 11 minutes with the patient with telehealth technology discussing the above problems.     Medication Adjustments/Labs and Tests Ordered: Current medicines are reviewed at length with the  patient today.  Concerns regarding medicines are outlined above.   Tests Ordered: No orders of the defined types were placed in this encounter.   Medication Changes: No orders of the defined types were placed in this encounter.   Disposition:  Follow up in 1 year(s)  Signed, Quay Burow, MD  09/21/2018 10:58 AM    [facility]

## 2018-11-21 NOTE — Telephone Encounter (Signed)
Opened in error

## 2019-01-10 ENCOUNTER — Ambulatory Visit: Payer: BC Managed Care – PPO | Admitting: Gastroenterology

## 2019-01-10 ENCOUNTER — Encounter: Payer: Self-pay | Admitting: Gastroenterology

## 2019-01-10 ENCOUNTER — Other Ambulatory Visit: Payer: Self-pay

## 2019-01-10 VITALS — BP 114/62 | HR 76 | Temp 98.4°F | Ht 71.0 in | Wt 218.2 lb

## 2019-01-10 DIAGNOSIS — R0789 Other chest pain: Secondary | ICD-10-CM | POA: Diagnosis not present

## 2019-01-10 DIAGNOSIS — K5909 Other constipation: Secondary | ICD-10-CM | POA: Diagnosis not present

## 2019-01-10 DIAGNOSIS — Z9889 Other specified postprocedural states: Secondary | ICD-10-CM | POA: Diagnosis not present

## 2019-01-10 NOTE — Progress Notes (Signed)
Marvin Harvey    [id number]    11/18/1955  Primary Care Physician:McGee, Apolonio Schneiders, DO  Referring Physician: Melrose Nakayama, MD [address] [place],  [place] [address]   Chief complaint:  Chest pain, constipation HPI: 63 year old male with history of ascending thoracic aortic aneurysm, status post Nissen fundoplication here for follow-up visit He is having atypical chest pain/discomfort, somewhat improved in the past few weeks.  He is worried about possible slipped Nissen Denies any dysphagia, odynophagia, regurgitation or heartburn Has had cardiac work-up with no significant changes, stable disease.  Continues to have intermittent constipation, no rectal bleeding or melena  Colonoscopy May 02, 2018: 7 out of 9 sessile polyps removed were tubular adenoma, largest polyp 8 mm in size, diverticulosis and hemorrhoids   Outpatient Encounter Medications as of 01/10/2019  Medication Sig  . allopurinol (ZYLOPRIM) 300 MG tablet Take 450 mg by mouth every evening.   Marland Kitchen aspirin EC 81 MG tablet Take 81 mg by mouth daily.  . fexofenadine (ALLEGRA) 60 MG tablet Take 60 mg by mouth as needed for allergies or rhinitis.  . metoprolol tartrate (LOPRESSOR) 25 MG tablet Take 0.5 tablets (12.5 mg total) by mouth 2 (two) times daily.  Marland Kitchen OVER THE COUNTER MEDICATION Vitamin B 12, 1000 mcg one tablet daily.  Marland Kitchen OVER THE COUNTER MEDICATION Vitamin D 3 one capsule daily.  Marland Kitchen OVER THE COUNTER MEDICATION Nasocort nasal spray, as needed for allergies.   No facility-administered encounter medications on file as of 01/10/2019.     Allergies as of 01/10/2019  . (No Known Allergies)    Past Medical History:  Diagnosis Date  . Allergy   . 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 Arthritis   . GERD (gastroesophageal reflux disease)   . History of repair of hiatal hernia 2001  . Sleep apnea    On CPAP    Past Surgical History:  Procedure  Laterality Date  . CLOSED REDUCTION HAND FRACTURE Right   . LEFT HEART CATH AND CORS/GRAFTS ANGIOGRAPHY N/A 01/26/2018   Procedure: LEFT HEART CATH AND CORS/GRAFTS ANGIOGRAPHY;  Surgeon: Lorretta Harp, MD;  Location: [facility];  Service: Cardiovascular;  Laterality: N/A;  . NISSEN FUNDOPLICATION  [id number]   Complete in [place], [place]    Family History  Problem Relation Age of Onset  . Coronary artery disease Father        s/p CABGx3, Valve replacement  . Aortic aneurysm Father        repaired  . Atrial fibrillation Mother   . Transient ischemic attack Mother   . Colon cancer Neg Hx   . Esophageal cancer Neg Hx   . Rectal cancer Neg Hx   . Stomach cancer Neg Hx   . Liver disease Neg Hx     Social History   Socioeconomic History  . Marital status: Married    Spouse name: Not on file  . Number of children: Not on file  . Years of education: Not on file  . Highest education level: Not on file  Occupational History  . Not on file  Social Needs  . Financial resource strain: Not on file  . Food insecurity    Worry: Not on file    Inability: Not on file  . Transportation needs    Medical: Not on file    Non-medical: Not on file  Tobacco Use  .  Smoking status: Never Smoker  . Smokeless tobacco: Never Used  Substance and Sexual Activity  . Alcohol use: Never    Frequency: Never  . Drug use: Never  . Sexual activity: Not on file  Lifestyle  . Physical activity    Days per week: Not on file    Minutes per session: Not on file  . Stress: Not on file  Relationships  . Social Herbalist on phone: Not on file    Gets together: Not on file    Attends religious service: Not on file    Active member of club or organization: Not on file    Attends meetings of clubs or organizations: Not on file    Relationship status: Not on file  . Intimate partner violence    Fear of current or ex partner: Not on file    Emotionally abused: Not on file    Physically  abused: Not on file    Forced sexual activity: Not on file  Other Topics Concern  . Not on file  Social History Narrative  . Not on file      Review of systems: Review of Systems  Constitutional: Negative for fever and chills.  HENT: Negative.   Eyes: Negative for blurred vision.  Respiratory: Negative for cough, shortness of breath and wheezing.   Cardiovascular: Negative for chest pain and palpitations.  Gastrointestinal: as per HPI Genitourinary: Negative for dysuria, urgency, frequency and hematuria.  Musculoskeletal: Negative for myalgias, back pain and joint pain.  Skin: Negative for itching and rash.  Neurological: Negative for dizziness, tremors, focal weakness, seizures and loss of consciousness.  Endo/Heme/Allergies: Positive for seasonal allergies.  Psychiatric/Behavioral: Negative for depression, suicidal ideas and hallucinations.  All other systems reviewed and are negative.   Physical Exam: Vitals:   01/10/19 1323  BP: 114/62  Pulse: 76  Temp: 98.4 F (36.9 C)   Body mass index is 30.43 kg/m. Gen:      No acute distress HEENT:  EOMI, sclera anicteric Neck:     No masses; no thyromegaly Lungs:    Clear to auscultation bilaterally; normal respiratory effort CV:         Regular rate and rhythm; no murmurs Abd:      + bowel sounds; soft, non-tender; no palpable masses, no distension Ext:    No edema; adequate peripheral perfusion Skin:      Warm and dry; no rash Neuro: alert and oriented x 3 Psych: normal mood and affect  Data Reviewed:  Reviewed labs, radiology imaging, old records and pertinent past GI work up   Assessment and Plan/Recommendations:  63 year old male with history of ascending thoracic aortic aneurysm, chronic GERD status post Nissen fundoplication  Atypical chest pain resolved He has no heartburn, regurgitation or reflux related symptoms.  Can consider upper GI series to evaluate fundoplication wrap and possible recurrent reflux.   Patient wants to hold off as her symptoms have improved.  Constipation with intermittent bloating Increase dietary fiber and fluid intake Start Benefiber 1 tablespoon twice daily Probiotic align 1 capsule daily for 4 weeks If continues to have persistent constipation will consider starting low-dose laxative  25 minutes was spent face-to-face with the patient. Greater than 50% of the time used for counseling as well as treatment plan and follow-up. He had multiple questions which were answered to his satisfaction  K. Denzil Magnuson , MD    CC: Melrose Nakayama, *

## 2019-01-10 NOTE — Patient Instructions (Addendum)
Take Benefiber 1 tablespoon twice daily  Use Align samples 1 capsule daily for 2-4 weeks  Increase water intake to 8 cups daily   Constipation, Adult Constipation is when a person:  Poops (has a bowel movement) fewer times in a week than normal.  Has a hard time pooping.  Has poop that is dry, hard, or bigger than normal. Follow these instructions at home: Eating and drinking   Eat foods that have a lot of fiber, such as: ? Fresh fruits and vegetables. ? Whole grains. ? Beans.  Eat less of foods that are high in fat, low in fiber, or overly processed, such as: ? [place] fries. ? Hamburgers. ? Cookies. ? Candy. ? Soda.  Drink enough fluid to keep your pee (urine) clear or pale yellow. General instructions  Exercise regularly or as told by your doctor.  Go to the restroom when you feel like you need to poop. Do not hold it in.  Take over-the-counter and prescription medicines only as told by your doctor. These include any fiber supplements.  Do pelvic floor retraining exercises, such as: ? Doing deep breathing while relaxing your lower belly (abdomen). ? Relaxing your pelvic floor while pooping.  Watch your condition for any changes.  Keep all follow-up visits as told by your doctor. This is important. Contact a doctor if:  You have pain that gets worse.  You have a fever.  You have not pooped for 4 days.  You throw up (vomit).  You are not hungry.  You lose weight.  You are bleeding from the anus.  You have thin, pencil-like poop (stool). Get help right away if:  You have a fever, and your symptoms suddenly get worse.  You leak poop or have blood in your poop.  Your belly feels hard or bigger than normal (is bloated).  You have very bad belly pain.  You feel dizzy or you faint. This information is not intended to replace advice given to you by your health care provider. Make sure you discuss any questions you have with your health care  provider. Document Released: 10/28/2007 Document Revised: 04/23/2017 Document Reviewed: 10/30/2015 Elsevier Patient Education  [place].   I appreciate the  opportunity to care for you  Thank You   Harl Bowie , MD

## 2019-06-08 ENCOUNTER — Other Ambulatory Visit: Payer: Self-pay | Admitting: Thoracic Surgery (Cardiothoracic Vascular Surgery)

## 2019-06-08 DIAGNOSIS — I712 Thoracic aortic aneurysm, without rupture, unspecified: Secondary | ICD-10-CM

## 2019-07-24 ENCOUNTER — Telehealth: Payer: Self-pay

## 2019-07-24 DIAGNOSIS — Z01818 Encounter for other preprocedural examination: Secondary | ICD-10-CM

## 2019-07-24 NOTE — Telephone Encounter (Signed)
-----   Message from Roland Earl sent at 07/24/2019  9:29 AM EST ----- Regarding: Lab work Good morning---this patient needs an order for a CMET for the CTA chest aorta that Dr. Gwenlyn Found has requested.

## 2019-08-15 ENCOUNTER — Other Ambulatory Visit: Payer: Self-pay

## 2019-08-15 ENCOUNTER — Ambulatory Visit: Payer: BC Managed Care – PPO | Admitting: Thoracic Surgery (Cardiothoracic Vascular Surgery)

## 2019-08-15 ENCOUNTER — Ambulatory Visit
Admission: RE | Admit: 2019-08-15 | Discharge: 2019-08-15 | Disposition: A | Discharge status: Home / Self Care | Payer: BC Managed Care – PPO | Source: Ambulatory Visit | Attending: Thoracic Surgery (Cardiothoracic Vascular Surgery) | Admitting: Thoracic Surgery (Cardiothoracic Vascular Surgery)

## 2019-08-15 ENCOUNTER — Encounter: Payer: Self-pay | Admitting: Thoracic Surgery (Cardiothoracic Vascular Surgery)

## 2019-08-15 VITALS — BP 150/77 | HR 53 | Temp 97.6°F | Resp 20 | Ht 71.0 in | Wt 210.0 lb

## 2019-08-15 DIAGNOSIS — I712 Thoracic aortic aneurysm, without rupture, unspecified: Secondary | ICD-10-CM

## 2019-08-15 DIAGNOSIS — I7121 Aneurysm of the ascending aorta, without rupture: Secondary | ICD-10-CM

## 2019-08-15 MED ORDER — IOPAMIDOL (ISOVUE-370) INJECTION 76%
75.0000 mL | Freq: Once | INTRAVENOUS | Status: AC | PRN
  Administered 2019-08-15: 75 mL via INTRAVENOUS

## 2019-08-15 NOTE — Progress Notes (Signed)
[address]       [place], [address]             [phone]       HPI: Mr. Music returns for follow-up of his ascending aneurysm  Marvin Harvey is a 64 year old gentleman with a past medical history significant for an ascending aortic aneurysm, gastroesophageal reflux with hiatal hernia, Nissen fundoplication, sleep apnea, and arthritis.  He initially presented in the fall 2019 with left-sided chest pain and pressure accompanied by shortness of breath.  His cardiac work-up was negative.  CT of the chest and embolus showed reported ascending aneurysm.  He has been followed since then.  I last saw him in March 2020.  He had a 4.2 cm ascending aneurysm.  His aorta measured about 4.7 cm at the sinuses of Valsalva.  He continues to have left-sided chest discomfort.  He particularly notices this at night when he is using CPAP and lies on his side.  It can be quite severe at times.  He did see gastroenterology and they offered him medication for reflux but he does not really have heartburn symptoms per se.  Past Medical History:  Diagnosis Date  . Allergy   . 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 Arthritis   . GERD (gastroesophageal reflux disease)   . History of repair of hiatal hernia 2001  . Sleep apnea    On CPAP    Current Outpatient Medications  Medication Sig Dispense Refill  . allopurinol (ZYLOPRIM) 300 MG tablet Take 450 mg by mouth every evening.   1  . aspirin EC 81 MG tablet Take 81 mg by mouth daily.    . fexofenadine (ALLEGRA) 60 MG tablet Take 60 mg by mouth as needed for allergies or rhinitis.    . metoprolol tartrate (LOPRESSOR) 25 MG tablet Take 0.5 tablets (12.5 mg total) by mouth 2 (two) times daily. 30 tablet 0  . OVER THE COUNTER MEDICATION Vitamin B 12, 1000 mcg one tablet daily.    Marland Kitchen OVER THE COUNTER MEDICATION Vitamin D 3 one capsule daily.    Marland Kitchen OVER THE COUNTER MEDICATION Nasocort nasal spray, as needed for  allergies.     No current facility-administered medications for this visit.    Physical Exam BP (!) 150/77   Pulse (!) 53   Temp 97.6 F (36.4 C) (Skin)   Resp 20   Ht 5\' 11"  (1.803 m)   Wt 210 lb (95.3 kg)   SpO2 97% Comment: RA  BMI 29.57 kg/m  64 year old man in no acute distress Alert and oriented x3 with no focal deficits Lungs clear with equal breath sounds bilaterally Cardiac regular rate and rhythm normal S1 and S2  Diagnostic Tests:  CT ANGIOGRAPHY CHEST WITH CONTRAST  TECHNIQUE: Multidetector CT imaging of the chest was performed using the standard protocol during bolus administration of intravenous contrast. Multiplanar CT image reconstructions and MIPs were obtained to evaluate the vascular anatomy.  CONTRAST:  28mL ISOVUE-370 IOPAMIDOL (ISOVUE-370) INJECTION 76%  COMPARISON:  August 02, 2018.  FINDINGS: Cardiovascular: Grossly stable 4.2 cm ascending thoracic aortic aneurysm is noted. No dissection is noted. Transverse aortic arch measures 2.9 cm. Proximal descending thoracic aorta measures 3.1 cm. Aortic root is dilated at 4.6 cm. Normal cardiac size. No pericardial effusion.  Mediastinum/Nodes: Moderate size sliding-type hiatal hernia is noted. No adenopathy is noted. Thyroid gland is unremarkable.  Lungs/Pleura: Lungs are clear. No pleural  effusion or pneumothorax.  Upper Abdomen: No acute abnormality.  Musculoskeletal: No chest wall abnormality. No acute or significant osseous findings.  Review of the MIP images confirms the above findings.  IMPRESSION: 1. Grossly stable 4.2 cm ascending thoracic aortic aneurysm. Recommend annual imaging followup by CTA or MRA. This recommendation follows 2010 ACCF/AHA/AATS/ACR/ASA/SCA/SCAI/SIR/STS/SVM Guidelines for the Diagnosis and Management of Patients with Thoracic Aortic Disease. Circulation. 2010; [id number]. Aortic aneurysm NOS (ICD10-I71.9). 2. Aortic root is dilated at 4.6 cm. 3.  Moderate size sliding-type hiatal hernia.   Electronically Signed   By: Marvin Harvey M.D.   On: 08/15/2019 11:10 I personally reviewed the CT images and concur with the findings noted above.  Impression: Marvin Harvey is a 64 year old man with a past medical history significant for an ascending aortic aneurysm, gastroesophageal reflux with hiatal hernia, Nissen fundoplication, sleep apnea, and arthritis.   Ascending aneurysm-stable at 4.2 cm, measures 4.6 cm at sinuses of Valsalva.  No indication for surgery.  Needs continued annual follow-up.  Chest pain-negative cardiac work-up.  Not due to aneurysm, likely related to his hiatal hernia/previous Nissen fundoplication. Could potentially benefit from surgical repair.  If his symptoms worsen he will contact us  Plan: Return in 1 year with CT angio of chest  Melrose Nakayama, MD [facility] [phone]

## 2019-11-14 ENCOUNTER — Telehealth: Payer: Self-pay | Admitting: Thoracic Surgery (Cardiothoracic Vascular Surgery)

## 2019-11-14 NOTE — Telephone Encounter (Signed)
-----   Message from Marvin Nakayama, MD sent at 11/14/2019 11:40 AM EDT ----- Yes if he is willing to see him  [facility] ----- Message ----- From: Marvin Harvey Sent: 11/14/2019  10:48 AM EDT To: Marvin Nakayama, MD  Good Morning!  Marvin Harvey is calling regarding a referral for his hiatal hernia. Would you like this patient to see Dr Kipp Brood?  Please advise.  Thanks

## 2019-12-01 ENCOUNTER — Encounter: Payer: BC Managed Care – PPO | Admitting: Thoracic Surgery (Cardiothoracic Vascular Surgery)

## 2019-12-08 ENCOUNTER — Encounter: Payer: BC Managed Care – PPO | Admitting: Thoracic Surgery (Cardiothoracic Vascular Surgery)

## 2019-12-22 ENCOUNTER — Other Ambulatory Visit: Payer: Self-pay

## 2019-12-22 ENCOUNTER — Encounter: Payer: Self-pay | Admitting: Thoracic Surgery (Cardiothoracic Vascular Surgery)

## 2019-12-22 ENCOUNTER — Institutional Professional Consult (permissible substitution): Payer: BC Managed Care – PPO | Admitting: Thoracic Surgery (Cardiothoracic Vascular Surgery)

## 2019-12-22 VITALS — BP 125/78 | HR 68 | Temp 97.7°F | Resp 20 | Ht 71.0 in | Wt 225.0 lb

## 2019-12-22 DIAGNOSIS — I712 Thoracic aortic aneurysm, without rupture, unspecified: Secondary | ICD-10-CM

## 2019-12-22 DIAGNOSIS — K449 Diaphragmatic hernia without obstruction or gangrene: Secondary | ICD-10-CM | POA: Diagnosis not present

## 2019-12-22 DIAGNOSIS — M109 Gout, unspecified: Secondary | ICD-10-CM | POA: Insufficient documentation

## 2019-12-22 NOTE — Progress Notes (Signed)
[address]       [place] [address]             [phone]                    Marvin Harvey Medical Record #[id number] Date of Birth: Nov 03, 1955  Referring: Marvin Harvey, * Primary Care: Marvin Gilles, DO Primary Cardiologist: Marvin primary care provider on file.  Chief Complaint:    Chief Complaint  Patient presents with  . Hiatal Hernia    Surgical eval    History of Present Illness:    Marvin Harvey 64 y.o. male presents for surgical evaluation of a hiatal hernia and possible slipped Nissen fundoplication.  He was originally evaluated by Dr.Hendrickson for a ascending aortic aneurysm of 4.7 cm.  But imaging also did show a hiatal hernia.  Over the past 10 years he has complained of episodic chest pain that is not associated with any meals.  He denies any reflux.  On further questioning he does endorse some dysphagia and odynophagia with certain meals.  The most recent of which was earlier this week when eating a bagel.  He also complains of early satiety.  This also has been present for over 10 years.  He has been evaluated by cardiology for this chest pain and in 2019 underwent a stress test which did not show any significant coronary disease.  An echocardiogram also showed normal LV and RV function with mild aortic regurgitation.  In regards to surgical history he underwent a Nissen fundoplication at a hospital in [facility] for severe reflux.  This was over 25 years ago.  Since that point he has had Marvin other reflux symptoms but does have digestive symptoms described above.       Zubrod Score: At the time of surgery this patient's most appropriate activity status/level should be described as: [x]     0    Normal activity, Marvin symptoms []     1    Restricted in physical strenuous activity but ambulatory, able to do out light work []     2    Ambulatory and capable of self care, unable to do work activities, up and about                >50 % of waking hours                              []     3    Only limited self care, in bed greater than 50% of waking hours []     4    Completely disabled, Marvin self care, confined to bed or chair []     5    Moribund   Past Medical History:  Diagnosis Date  . Allergy   . 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 Arthritis   . GERD (gastroesophageal reflux disease)   . History of repair of hiatal hernia 2001  . Sleep apnea    On CPAP    Past Surgical History:  Procedure Laterality Date  . CLOSED REDUCTION HAND FRACTURE Right   . LEFT HEART CATH AND CORS/GRAFTS ANGIOGRAPHY N/A 01/26/2018   Procedure: LEFT HEART CATH AND CORS/GRAFTS ANGIOGRAPHY;  Surgeon: Lorretta Harp, MD;  Location: [facility];  Service: Cardiovascular;  Laterality: N/A;  . NISSEN FUNDOPLICATION  4403  Complete in [place], [place]    Family History  Problem Relation Age of Onset  . Coronary artery disease Father        s/p CABGx3, Valve replacement  . Aortic aneurysm Father        repaired  . Atrial fibrillation Mother   . Transient ischemic attack Mother   . Colon cancer Neg Hx   . Esophageal cancer Neg Hx   . Rectal cancer Neg Hx   . Stomach cancer Neg Hx   . Liver disease Neg Hx      Social History   Tobacco Use  Smoking Status Never Smoker  Smokeless Tobacco Never Used    Social History   Substance and Sexual Activity  Alcohol Use Never     Marvin Known Allergies  Current Outpatient Medications  Medication Sig Dispense Refill  . allopurinol (ZYLOPRIM) 300 MG tablet Take 450 mg by mouth every evening.   1  . aspirin EC 81 MG tablet Take 81 mg by mouth daily.    . fexofenadine (ALLEGRA) 60 MG tablet Take 60 mg by mouth as needed for allergies or rhinitis.    . metoprolol tartrate (LOPRESSOR) 25 MG tablet Take 0.5 tablets (12.5 mg total) by mouth 2 (two) times daily. 30 tablet 0  . OVER THE COUNTER MEDICATION Vitamin B 12, 1000 mcg one tablet  daily.    Marland Kitchen OVER THE COUNTER MEDICATION Vitamin D 3 one capsule daily.    Marland Kitchen OVER THE COUNTER MEDICATION Nasocort nasal spray, as needed for allergies.     Marvin current facility-administered medications for this visit.    Review of Systems  Constitutional: Negative.  Negative for malaise/fatigue and weight loss.  HENT: Positive for congestion and sinus pain. Negative for sore throat.   Respiratory: Positive for shortness of breath. Negative for cough and stridor.   Cardiovascular: Positive for chest pain.  Gastrointestinal: Positive for abdominal pain and constipation. Negative for heartburn, nausea and vomiting.  Musculoskeletal: Negative.   Neurological: Negative.      PHYSICAL EXAMINATION: BP 125/78   Pulse 68   Temp 97.7 F (36.5 C) (Skin)   Resp 20   Ht 5\' 11"  (1.803 m)   Wt (!) 225 lb (102.1 kg)   SpO2 96% Comment: RA  BMI 31.38 kg/m  Physical Exam Constitutional:      Appearance: Normal appearance.  HENT:     Head: Normocephalic and atraumatic.  Eyes:     Extraocular Movements: Extraocular movements intact.     Conjunctiva/sclera: Conjunctivae normal.  Cardiovascular:     Rate and Rhythm: Normal rate.  Pulmonary:     Effort: Pulmonary effort is normal. Marvin respiratory distress.  Abdominal:     General: Abdomen is flat.     Palpations: Abdomen is soft.     Comments: Umbilical hernia. His laparoscopic incisions are well-healed  Musculoskeletal:        General: Normal range of motion.     Cervical back: Normal range of motion.  Skin:    General: Skin is warm and dry.  Neurological:     General: Marvin focal deficit present.     Mental Status: He is alert and oriented to person, place, and time.     Diagnostic Studies & Laboratory data:         I have independently reviewed the above radiology studies  and reviewed the findings with the patient.   Recent Lab Findings: Lab Results  Component Value Date   WBC 6.8 01/27/2018  HGB 14.8 01/27/2018   HCT  44.5 01/27/2018   PLT 142 (L) 01/27/2018   GLUCOSE 95 09/15/2018   NA 142 09/15/2018   K 4.1 09/15/2018   CL 104 09/15/2018   CREATININE 1.01 09/15/2018   BUN 13 09/15/2018   CO2 23 09/15/2018   INR 1.20 01/26/2018     Assessment / Plan:   [name] is a 64 year old gentleman who has a history of gastroesophageal reflux disease who underwent a Nissen fundoplication.  I personally reviewed his CT scan and he does have a hiatal hernia and it appears as though his prior fundoplication has slipped into his mediastinum.  I explained to him that his symptoms of dysphagia, odynophagia, and early satiety are likely due to to the slipped fundoplication.  It is unclear as to whether or not this intermittent chest pain is due to to the same process however he is already had a thorough cardiac evaluation which was negative.  This is his only likely cause for both noted pains.  I have offered him the option of undergoing a robotic assisted hiatal hernia repair and redo fundoplication.  He would like some time to think about this.  If he does ultimately decide for surgery I will need to obtain records from the hospital in [facility] that performed his original fundoplication.  I will also speak with his gastroenterologist to see if there is any other studies that she would like performed prior to his operation.     I  spent 55 minutes with  the patient face to face and greater then 50% of the time was spent in counseling and coordination of care.    Lajuana Matte 12/22/2019 5:25 PM

## 2020-06-06 IMAGING — DX DG CHEST 2V
2 series · 2 of 2 positions shown · non-contrast
Comparison: None.

CLINICAL DATA: Chest heaviness, intermittent pressure over the last
3 weeks, some shortness of breath and nausea

EXAM:
CHEST - 2 VIEW

[chest pa]
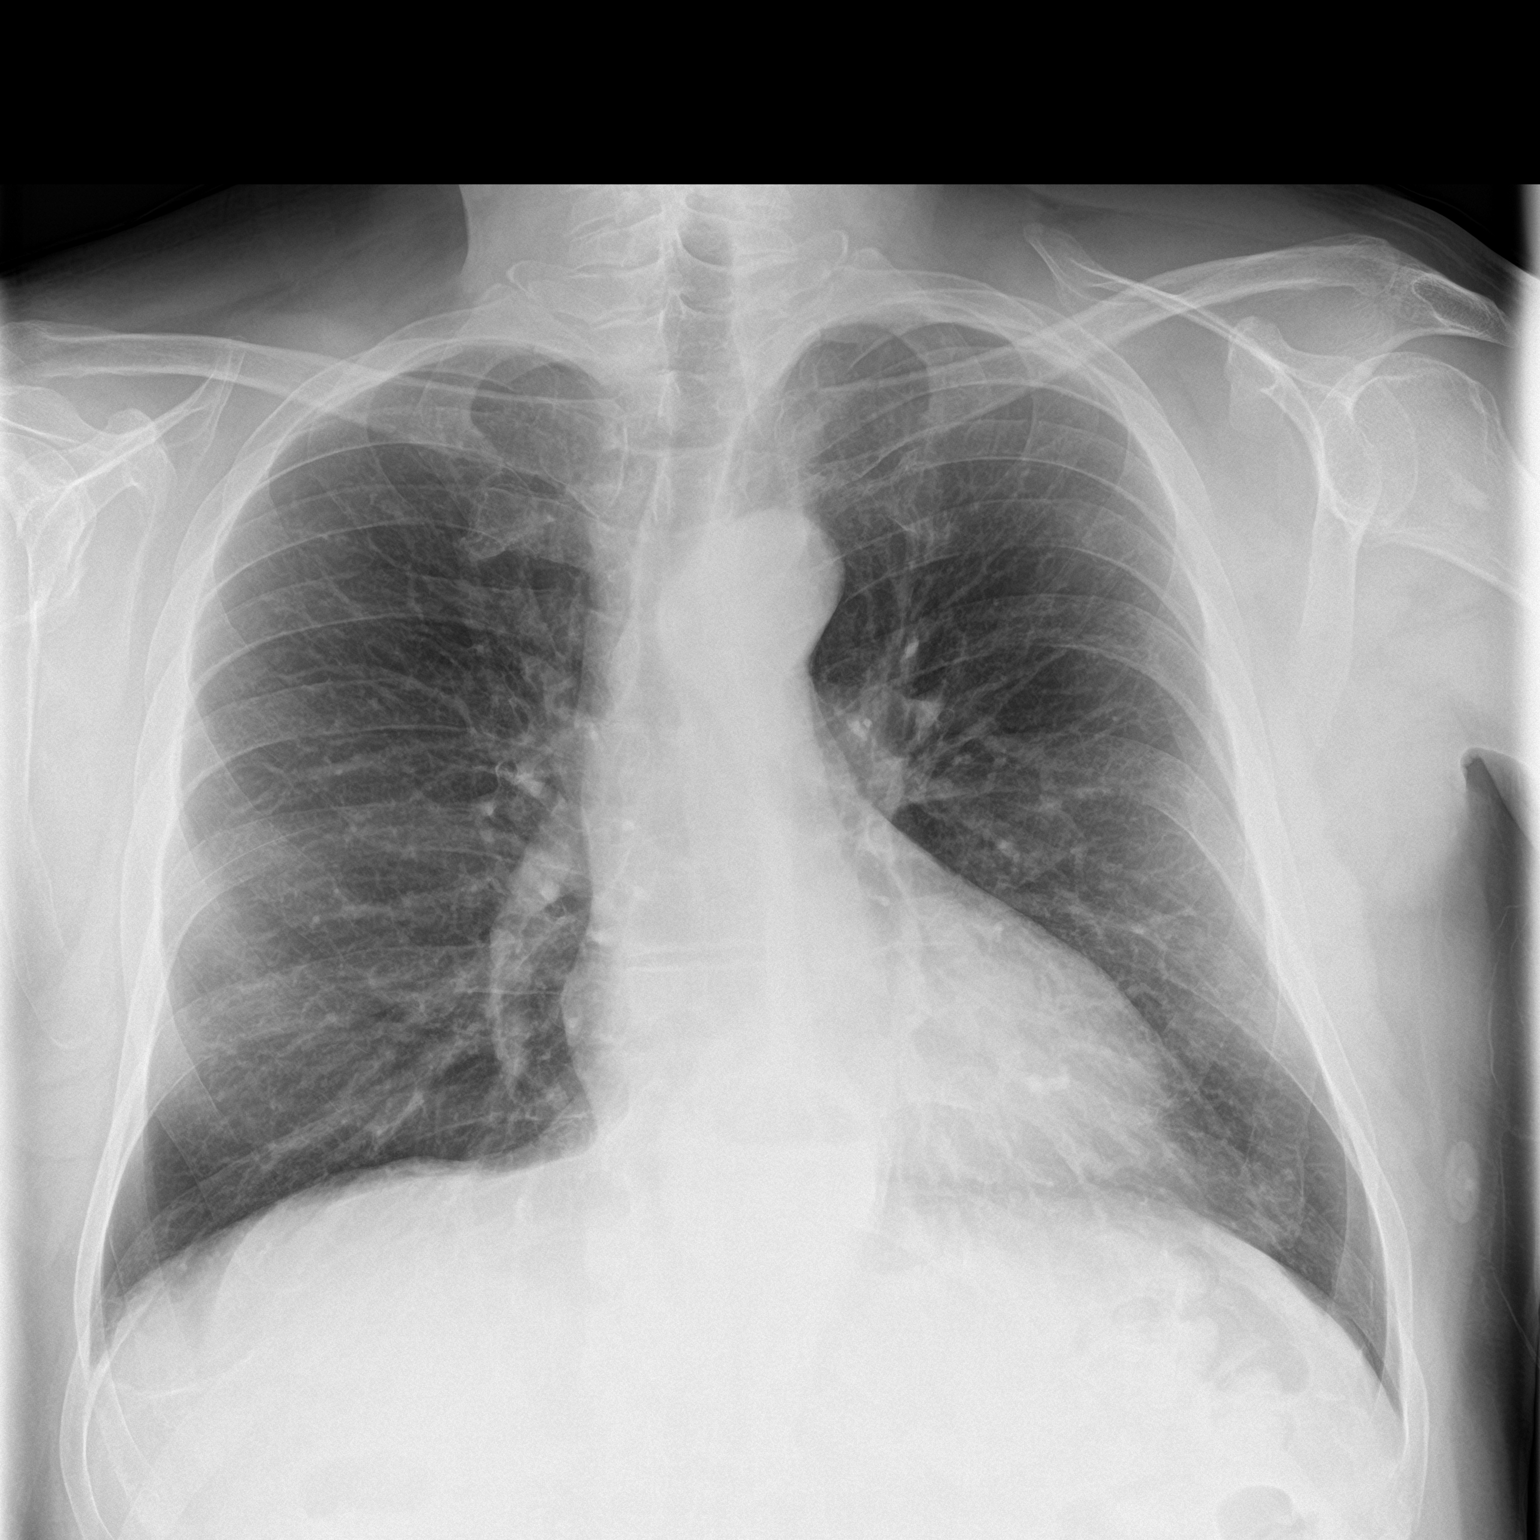

[chest lat]
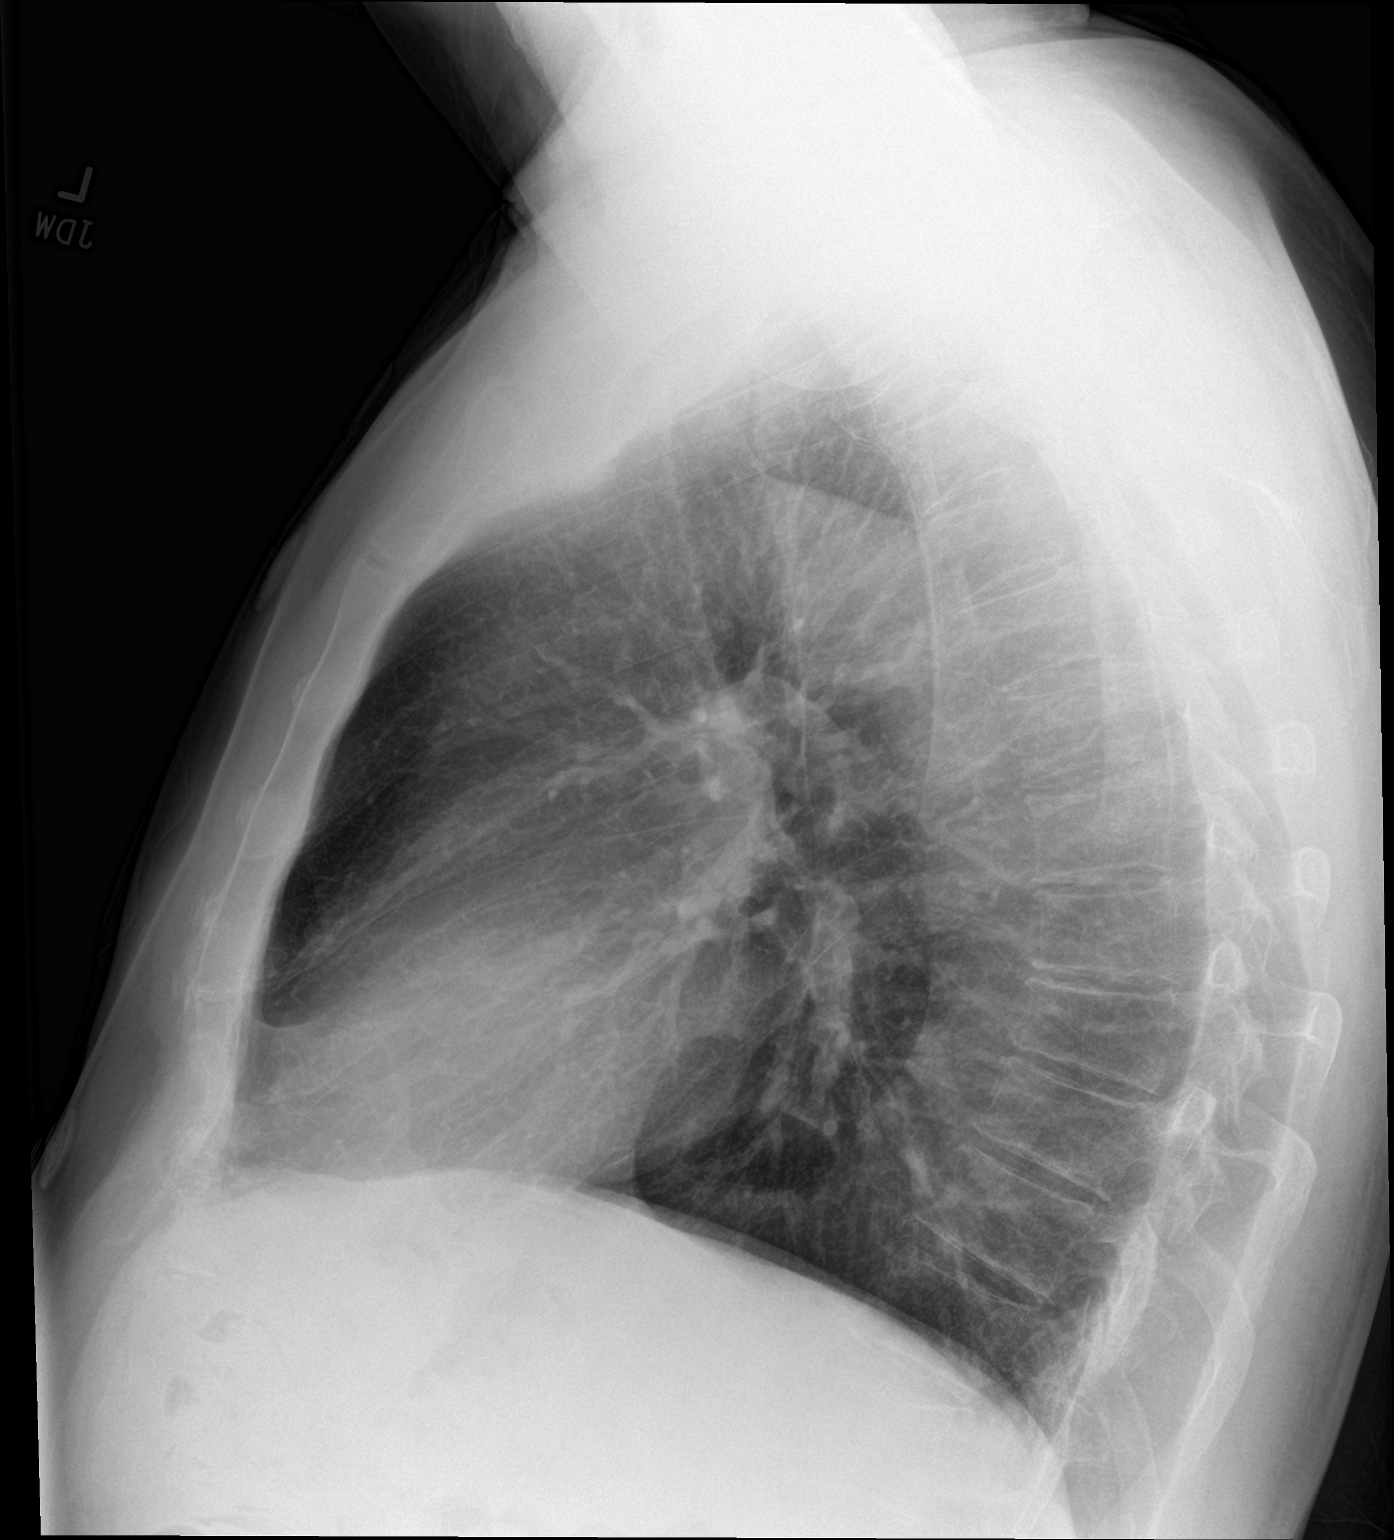

[2 of 2 positions shown; findings below may reference images not displayed]

FINDINGS: No active infiltrate or effusion is seen. Mediastinal and hilar
contours are unremarkable. The heart may be borderline enlarged.
There is a small hiatal hernia noted. No acute bony abnormality is
seen.
IMPRESSION: 1. No active cardiopulmonary disease.
2. Borderline cardiomegaly.
3. Small hiatal hernia.

## 2020-06-27 ENCOUNTER — Other Ambulatory Visit: Payer: Self-pay | Admitting: *Deleted

## 2020-06-27 DIAGNOSIS — I712 Thoracic aortic aneurysm, without rupture, unspecified: Secondary | ICD-10-CM

## 2020-08-20 ENCOUNTER — Other Ambulatory Visit: Payer: BC Managed Care – PPO

## 2020-08-20 ENCOUNTER — Encounter: Payer: BC Managed Care – PPO | Admitting: Thoracic Surgery (Cardiothoracic Vascular Surgery)

## 2020-08-27 ENCOUNTER — Ambulatory Visit
Admission: RE | Admit: 2020-08-27 | Discharge: 2020-08-27 | Disposition: A | Discharge status: Home / Self Care | Payer: Medicare Other | Source: Ambulatory Visit | Attending: Thoracic Surgery (Cardiothoracic Vascular Surgery) | Admitting: Thoracic Surgery (Cardiothoracic Vascular Surgery)

## 2020-08-27 ENCOUNTER — Other Ambulatory Visit: Payer: Self-pay

## 2020-08-27 ENCOUNTER — Encounter: Payer: Self-pay | Admitting: Thoracic Surgery (Cardiothoracic Vascular Surgery)

## 2020-08-27 ENCOUNTER — Ambulatory Visit (INDEPENDENT_AMBULATORY_CARE_PROVIDER_SITE_OTHER): Payer: Medicare Other | Admitting: Thoracic Surgery (Cardiothoracic Vascular Surgery)

## 2020-08-27 VITALS — BP 115/78 | HR 84 | Resp 20 | Ht 71.0 in | Wt 220.0 lb

## 2020-08-27 DIAGNOSIS — I712 Thoracic aortic aneurysm, without rupture, unspecified: Secondary | ICD-10-CM

## 2020-08-27 MED ORDER — IOPAMIDOL (ISOVUE-370) INJECTION 76%
75.0000 mL | Freq: Once | INTRAVENOUS | Status: AC | PRN
  Administered 2020-08-27: 75 mL via INTRAVENOUS

## 2020-08-27 NOTE — Progress Notes (Signed)
[address]       [place] [address]             [phone]      HPI: [name] returns for follow-up of his ascending aneurysm  Marvin Harvey is a 65 year old man with a history of an ascending aneurysm, gastroesophageal reflux, hiatal hernia, Nissen fundoplication, sleep apnea, and arthritis.  The fall 2019 he presented with left-sided chest pain and shortness of breath.  His cardiac work-up was negative.  CT of the chest showed no evidence of pulmonary embolus but did show an ascending aneurysm.  There also was evidence of a possible slipped Nissen.  I last saw him in March 2021.  His aneurysm was stable measuring about 4.2 cm in the mid ascending and 4.7 cm at the sinuses of Valsalva.  He was having a lot of left-sided chest discomfort.  I had him see Dr. Kipp Harvey for a possible slipped Nissen.  He saw Dr. Kipp Harvey in July.  He was offered the option of redo surgery.  He opted not to have that done.  Since then his pain has not been particularly bothersome and only occurs occasionally.  He did bring pictures from his previous surgery.  The pictures show a hiatal hernia repair and Nissen fundoplication.  Past Medical History:  Diagnosis Date  . Allergy   . 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 Arthritis   . GERD (gastroesophageal reflux disease)   . History of repair of hiatal hernia 2001  . Sleep apnea    On CPAP     Current Outpatient Medications  Medication Sig Dispense Refill  . allopurinol (ZYLOPRIM) 300 MG tablet Take 450 mg by mouth every evening.   1  . aspirin EC 81 MG tablet Take 81 mg by mouth daily.    . fexofenadine (ALLEGRA) 60 MG tablet Take 60 mg by mouth as needed for allergies or rhinitis.    . metoprolol tartrate (LOPRESSOR) 25 MG tablet Take 0.5 tablets (12.5 mg total) by mouth 2 (two) times daily. 30 tablet 0  . OVER THE COUNTER MEDICATION Vitamin B 12, 1000 mcg one tablet daily.    Marland Kitchen OVER THE  COUNTER MEDICATION Vitamin D 3 one capsule daily.    Marland Kitchen OVER THE COUNTER MEDICATION Nasocort nasal spray, as needed for allergies.     No current facility-administered medications for this visit.    Physical Exam BP 115/78   Pulse 84   Resp 20   Ht 5\' 11"  (1.803 m)   Wt 220 lb (99.8 kg)   SpO2 94% Comment: RA  BMI 30.75 kg/m  65 year old man in no acute distress Alert and oriented x3 with no focal deficits Cardiac regular rate and rhythm with a normal S1 and S2, no murmur Lungs clear bilaterally No peripheral edema DP and PT 2+ bilaterally  Diagnostic Tests: CT ANGIOGRAPHY CHEST WITH CONTRAST  TECHNIQUE: Multidetector CT imaging of the chest was performed using the standard protocol during bolus administration of intravenous contrast. Multiplanar CT image reconstructions and MIPs were obtained to evaluate the vascular anatomy.  CONTRAST:  75mL ISOVUE-370 IOPAMIDOL (ISOVUE-370) INJECTION 76%  COMPARISON:  Prior CTA chest 10/15/2019 and 08/02/2018  FINDINGS: Cardiovascular: Stable mild fusiform aneurysmal dilation of the ascending thoracic aorta with a maximal diameter of 4.3 cm (confirmed on coronal and sagittal reformatted images. The aortic root remains at the upper limits of normal at 4.5 cm. Normal transverse  and descending thoracic aorta. No significant atherosclerotic plaque. Main pulmonary artery is normal in size. The heart is normal in size. No pericardial effusion.  Mediastinum/Nodes: Unremarkable CT appearance of the thyroid gland. No suspicious mediastinal or hilar adenopathy. No soft tissue mediastinal mass. Moderately large hiatal hernia.  Lungs/Pleura: Lungs are clear. No pleural effusion or pneumothorax.  Upper Abdomen: Moderately large hiatal hernia. No acute abnormality. Multifocal exophytic simple cysts arising from the kidneys.  Musculoskeletal: No acute fracture or aggressive appearing lytic or blastic osseous lesion.  Review of the  MIP images confirms the above findings.  IMPRESSION: 1. Stable 4.3 cm fusiform aneurysmal dilation of the ascending thoracic aorta. Recommend annual imaging followup by CTA or MRA. This recommendation follows 2010 ACCF/AHA/AATS/ACR/ASA/SCA/SCAI/SIR/STS/SVM Guidelines for the Diagnosis and Management of Patients with Thoracic Aortic Disease. Circulation. 2010; 121: B147-W295. Aortic aneurysm NOS (ICD10-I71.9) 2. Stable ectasia of the aortic root at 4.5 cm. 3. Moderately large hiatal hernia. 4. Multifocal bilateral renal cysts.  Signed,  Marvin Peaches, MD, [facility]  [facility]   Electronically Signed   By: Marvin Harvey M.D.   On: 08/27/2020 12:34 I personally reviewed the CT images.  The aorta measures 4.7 cm at the sinuses of Valsalva with about 4.2 to 4.3 cm in the ascending aorta.  No change from prior study.  Impression: Marvin Harvey  is a 65 year old man with a history of an ascending aneurysm, gastroesophageal reflux, hiatal hernia, Nissen fundoplication, sleep apnea, and arthritis.  In 2019 he presented with left-sided chest pain and some shortness of breath.  He ruled out for MI and pulmonary embolus.  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  Also later noted to have a recurrent hiatal hernia with a possible slipped Nissen fundoplication.  Thoracic aortic atherosclerosis/ascending aortic aneurysm-stable measurements.  Blood pressure well controlled on Lopressor.  Needs continued annual follow-up.  Hypertension-blood pressure well controlled on Lopressor.  Reflux-symptoms significantly improved.  Opted not to have surgery for a slipped Nissen.  The pictures he brought with him today clearly show a hiatal hernia repair with a Nissen fundoplication.  Plan: Return in 1 year with CT angio of chest  Marvin Nakayama, MD [facility] [phone]

## 2020-10-18 ENCOUNTER — Ambulatory Visit (INDEPENDENT_AMBULATORY_CARE_PROVIDER_SITE_OTHER): Payer: Medicare Other | Admitting: Cardiovascular Disease

## 2020-10-18 ENCOUNTER — Other Ambulatory Visit: Payer: Self-pay

## 2020-10-18 ENCOUNTER — Encounter: Payer: Self-pay | Admitting: Cardiovascular Disease

## 2020-10-18 VITALS — BP 138/80 | HR 47 | Ht 71.0 in | Wt 221.6 lb

## 2020-10-18 DIAGNOSIS — I719 Aortic aneurysm of unspecified site, without rupture: Secondary | ICD-10-CM

## 2020-10-18 DIAGNOSIS — I208 Other forms of angina pectoris: Secondary | ICD-10-CM | POA: Diagnosis not present

## 2020-10-18 DIAGNOSIS — I7121 Aneurysm of the ascending aorta, without rupture: Secondary | ICD-10-CM

## 2020-10-18 NOTE — Progress Notes (Signed)
10/18/2020 Marvin Harvey   July 23, 1955  [id number]  Primary Physician Sherrilee Gilles, DO Primary Cardiologist: Lorretta Harp MD [name], [place], [place]  HPI:  Marvin Harvey is a 65 y.o.  mild to moderately overweight married Caucasian male father of 54, grandfather of 2 grandchildren who is a Engineering geologist and  who I last saw on 8 video telemedicine visit 09/21/2018. He was being seen for his first post hospital outpatient follow-up after being admitted 01/27/2018 in transfer from [facility] for chest pain. I done a cardiac catheterization on him 01/26/2018 revealing normal coronary arteries.  He was seen by cardiologist there and is had his aortic valve followed by echo periodically. He recently had a CTA that showed a 5.2 cm aortic aneurysm. His father apparently also had a thoracic aortic aneurysm and had this repaired along with an AVR in his 61s. He has no other cardiac risk factors.  Since I saw saw him for a video telemedicine virtual visit 09/21/2018 he has done well.  He denies chest pain or shortness of breath.  He does have a slipped Nissen fundoplication that was done at age 8 followed by Dr. Kipp Brood.  He is followed by Dr. Roxan Hockey for his small ascending thoracic aortic aneurysm.  Recent CTA performed 08/27/2020 measured this a 43 mm, somewhat less than had been described in the past.   Current Meds  Medication Sig  . allopurinol (ZYLOPRIM) 300 MG tablet Take 450 mg by mouth every evening.   Marland Kitchen aspirin EC 81 MG tablet Take 81 mg by mouth daily.  . fexofenadine (ALLEGRA) 60 MG tablet Take 60 mg by mouth as needed for allergies or rhinitis.  Marland Kitchen OVER THE COUNTER MEDICATION Vitamin B 12, 1000 mcg one tablet daily.  Marland Kitchen OVER THE COUNTER MEDICATION Vitamin D 3 one capsule daily.  Marland Kitchen OVER THE COUNTER MEDICATION Nasocort nasal spray, as needed for allergies.  . [DISCONTINUED] metoprolol tartrate (LOPRESSOR) 25 MG tablet Take 0.5 tablets (12.5 mg total) by mouth 2 (two) times  daily.     No Known Allergies  Social History   Socioeconomic History  . Marital status: Married    Spouse name: Not on file  . Number of children: Not on file  . Years of education: Not on file  . Highest education level: Not on file  Occupational History  . Not on file  Tobacco Use  . Smoking status: Never Smoker  . Smokeless tobacco: Never Used  Vaping Use  . Vaping Use: Never used  Substance and Sexual Activity  . Alcohol use: Never  . Drug use: Never  . Sexual activity: Not on file  Other Topics Concern  . Not on file  Social History Narrative  . Not on file   Social Determinants of Health   Financial Resource Strain: Not on file  Food Insecurity: Not on file  Transportation Needs: Not on file  Physical Activity: Not on file  Stress: Not on file  Social Connections: Not on file  Intimate Partner Violence: Not on file     Review of Systems: General: negative for chills, fever, night sweats or weight changes.  Cardiovascular: negative for chest pain, dyspnea on exertion, edema, orthopnea, palpitations, paroxysmal nocturnal dyspnea or shortness of breath Dermatological: negative for rash Respiratory: negative for cough or wheezing Urologic: negative for hematuria Abdominal: negative for nausea, vomiting, diarrhea, bright red blood per rectum, melena, or hematemesis Neurologic: negative for visual changes, syncope, or dizziness All other systems reviewed and  are otherwise negative except as noted above.    Blood pressure 138/80, pulse (!) 47, height 5\' 11"  (1.803 m), weight 221 lb 9.6 oz (100.5 kg), SpO2 98 %.  General appearance: alert and no distress Neck: no adenopathy, no carotid bruit, no JVD, supple, symmetrical, trachea midline and thyroid not enlarged, symmetric, no tenderness/mass/nodules Lungs: clear to auscultation bilaterally Heart: regular rate and rhythm, S1, S2 normal, no murmur, click, rub or gallop Extremities: extremities normal, atraumatic,  no cyanosis or edema Pulses: 2+ and symmetric Skin: Skin color, texture, turgor normal. No rashes or lesions Neurologic: Alert and oriented X 3, normal strength and tone. Normal symmetric reflexes. Normal coordination and gait  EKG sinus bradycardia at 47 with first-degree AV block.  I personally reviewed this EKG.  ASSESSMENT AND PLAN:   Aortic root aneurysm (HCC) History of small ascending thoracic aortic aneurysm most recently measured by CTA at 4.3 cm on 08/27/2020 followed by Dr. Roxan Hockey.  Chest pain History of atypical chest pain in the past with cardiac catheterization performed by myself 01/26/2018 revealing normal coronary arteries.      Lorretta Harp MD FACP,FACC,FAHA, [facility] 10/18/2020 10:13 AM

## 2020-10-18 NOTE — Patient Instructions (Signed)
Medication Instructions:   -Gradually stop metoprolol tartrate.  *If you need a refill on your cardiac medications before your next appointment, please call your pharmacy*   Follow-Up: At [facility], you and your health needs are our priority.  As part of our continuing mission to provide you with exceptional heart care, we have created designated Provider Care Teams.  These Care Teams include your primary Cardiologist (physician) and Advanced Practice Providers (APPs -  Physician Assistants and Nurse Practitioners) who all work together to provide you with the care you need, when you need it.  We recommend signing up for the patient portal called "MyChart".  Sign up information is provided on this After Visit Summary.  MyChart is used to connect with patients for Virtual Visits (Telemedicine).  Patients are able to view lab/test results, encounter notes, upcoming appointments, etc.  Non-urgent messages can be sent to your provider as well.   To learn more about what you can do with MyChart, go to [email or web address].    Your next appointment:   12 month(s)  The format for your next appointment:   In Person  Provider:   Quay Burow, MD   Other Instructions Keep a blood pressure log for the next 2 months and return to discuss with PharmD.

## 2020-10-18 NOTE — Assessment & Plan Note (Signed)
History of atypical chest pain in the past with cardiac catheterization performed by myself 01/26/2018 revealing normal coronary arteries.

## 2020-10-18 NOTE — Assessment & Plan Note (Signed)
History of small ascending thoracic aortic aneurysm most recently measured by CTA at 4.3 cm on 08/27/2020 followed by Dr. Roxan Hockey.

## 2020-12-21 NOTE — Progress Notes (Signed)
12/23/2020 Shanon Ace May 07, 1956 [id number]   HPI:  Marvin Harvey is a 65 y.o. male patient of Dr Gwenlyn Found, who presents today for hypertension clinic evaluation.  In addition to hypertension, his medical history is significant for an ascending thoracic aortic aneurysm and gout.  Because of the aneurysm, will want to be vigilant at keeping blood pressure < 130/80.  He was previously on metoprolol, but has been weaning off over the past 2 months.  This is in part due to the fact that the aneurysm was stable in size over the past few years.    Today he is in the office and feeling well.  Has a list of home blood pressure readings from the past month (see below).     Blood Pressure Goal:  130/80  Current Medications: none (last dose of metoprolol 12.5 mg was yesterday - tood q3d in July)  Family Hx: father with similar aneurysm, died at 90 had repair; mother with arrhythmias, died at 5, siblings healthy, 1 child healthy  Social Hx: no tobacco, no alcohol,  2+ cups of coffee per day, some soda  Diet: mix of home, eating out, no added salt; mix of proteins, limits beef/pork due to gout, no seafood; mostly chicken, plenty of vegetables  Exercise: walks 30-60 min 3-4 days per week  Home BP readings: home cuff couple of years old, [employer] arm cuff; checks right arm; 54 readings in past month at various times of day  Average 122/79 (range   Intolerances: nkda  Labs: 08/2018:  Na 142, K 4.1, Glu 95, BUM 13, SCr 1.01, GFR 79  Wt Readings from Last 3 Encounters:  12/23/20 221 lb 9.6 oz (100.5 kg)  10/18/20 221 lb 9.6 oz (100.5 kg)  08/27/20 220 lb (99.8 kg)   BP Readings from Last 3 Encounters:  12/23/20 128/88  10/18/20 138/80  08/27/20 115/78   Pulse Readings from Last 3 Encounters:  12/23/20 60  10/18/20 (!) 47  08/27/20 84    Current Outpatient Medications  Medication Sig Dispense Refill   allopurinol (ZYLOPRIM) 300 MG tablet Take 450 mg by mouth every evening.   1   aspirin  EC 81 MG tablet Take 81 mg by mouth daily.     fexofenadine (ALLEGRA) 60 MG tablet Take 60 mg by mouth as needed for allergies or rhinitis.     Multiple Vitamins-Minerals (MULTIVITAMIN WITH MINERALS) tablet Take 1 tablet by mouth daily.     OVER THE COUNTER MEDICATION Vitamin B 12, 1000 mcg one tablet daily.     OVER THE COUNTER MEDICATION Vitamin D 3 one capsule daily.     OVER THE COUNTER MEDICATION Nasocort nasal spray, as needed for allergies.     No current facility-administered medications for this visit.    No Known Allergies  Past Medical History:  Diagnosis Date   Allergy    Aortic root aneurysm ([place]) 01/11/2018   4.7 centimeters at sinuses of Valsalva, 4.2 cm mid ascending   Arthritis    GERD (gastroesophageal reflux disease)    History of repair of hiatal hernia 2001   Sleep apnea    On CPAP    Blood pressure 128/88, pulse 60, resp. rate 16, height '5\' 10"'$  (1.778 m), weight 221 lb 9.6 oz (100.5 kg), SpO2 98 %.  Aortic root aneurysm (HCC) Blood pressure home readings show patient average within acceptable range.  Will have him continue with home monitoring and should average show to be > 130 or > 80  he will reach out to office.  Explained need for tight control to help prevent increase in aneurysm size.  Would probably go to ACEI/ARB if his pressure increases over time, as will be more beneficial at lowering both systolic and diastolic readings.     Tommy Medal PharmD CPP [facility] [address] [place], [place] [address] [phone]

## 2020-12-23 ENCOUNTER — Ambulatory Visit (INDEPENDENT_AMBULATORY_CARE_PROVIDER_SITE_OTHER): Payer: Medicare Other | Admitting: Pharmacist Clinician (PhC)/ Clinical Pharmacy Specialist

## 2020-12-23 ENCOUNTER — Other Ambulatory Visit: Payer: Self-pay

## 2020-12-23 DIAGNOSIS — I7121 Aneurysm of the ascending aorta, without rupture: Secondary | ICD-10-CM

## 2020-12-23 DIAGNOSIS — I719 Aortic aneurysm of unspecified site, without rupture: Secondary | ICD-10-CM | POA: Diagnosis not present

## 2020-12-23 DIAGNOSIS — I208 Other forms of angina pectoris: Secondary | ICD-10-CM

## 2020-12-23 DIAGNOSIS — Q2543 Congenital aneurysm of aorta: Secondary | ICD-10-CM

## 2020-12-23 NOTE — Assessment & Plan Note (Signed)
Blood pressure home readings show patient average within acceptable range.  Will have him continue with home monitoring and should average show to be > 130 or > 80 he will reach out to office.  Explained need for tight control to help prevent increase in aneurysm size.  Would probably go to ACEI/ARB if his pressure increases over time, as will be more beneficial at lowering both systolic and diastolic readings.

## 2020-12-23 NOTE — Patient Instructions (Signed)
  Check your blood pressure at home 3-4 times each week and keep record of the readings.  At the end of the month, average your readings.  Goal is to be <130/80.  If you see it trend above this, please reach out to [place]. (Raji Glinski/Chris at [phone])  Exercise as you're able, try to walk approximately 30 minutes per day.  Keep salt intake to a minimum, especially watch canned and prepared boxed foods.  Eat more fresh fruits and vegetables and fewer canned items.  Avoid eating in fast food restaurants.    HOW TO TAKE YOUR BLOOD PRESSURE: Rest 5 minutes before taking your blood pressure.  Don't smoke or drink caffeinated beverages for at least 30 minutes before. Take your blood pressure before (not after) you eat. Sit comfortably with your back supported and both feet on the floor (don't cross your legs). Elevate your arm to heart level on a table or a desk. Use the proper sized cuff. It should fit smoothly and snugly around your bare upper arm. There should be enough room to slip a fingertip under the cuff. The bottom edge of the cuff should be 1 inch above the crease of the elbow. Ideally, take 3 measurements at one sitting and record the average.

## 2021-03-31 ENCOUNTER — Telehealth: Payer: Self-pay | Admitting: Cardiovascular Disease

## 2021-03-31 NOTE — Telephone Encounter (Signed)
Pt c/o BP issue: STAT if pt c/o blurred vision, one-sided weakness or slurred speech  1. What are your last 5 BP readings?  180/160 160/145  2. Are you having any other symptoms (ex. Dizziness, headache, blurred vision, passed out)?  Headaches   3. What is your BP issue?   Patient states his BP has been elevated and he is unable to get it down.

## 2021-03-31 NOTE — Progress Notes (Signed)
Teaching     HPI: Follow-up thoracic aortic aneurysm and palpitations.  Followed by Dr. Gwenlyn Found.  Echocardiogram September 2019 showed vigorous LV function, grade 2 diastolic dysfunction and mild aortic insufficiency.  Cardiac catheterization September 2019 showed normal LV function and normal coronary arteries.  CTA April 2022 showed 4.3 cm ascending thoracic aortic aneurysm and 4.5 cm aortic root.  Recently contacted the office with palpitations/question A. fib.  He was added to my schedule today.  Since last seen patient states that Saturday he was working in his yard.  Afterwards he developed a headache and fatigue.  He also developed a pounding sensation in his chest.  He checked his blood pressure and it was elevated.  He then checked it later and it was still elevated.  He took metoprolol 12.5 mg with improvement.  He typically has dyspnea with more vigorous activities but not routine activities.  He denies orthopnea, PND, pedal edema, exertional chest pain or syncope.  He does state that he has had a chest tightness for several days persistently.  Also an occasional sharp pain in his chest for 1 to 2 seconds.  Current Outpatient Medications  Medication Sig Dispense Refill   allopurinol (ZYLOPRIM) 300 MG tablet Take 450 mg by mouth every evening.   1   aspirin EC 81 MG tablet Take 81 mg by mouth daily.     Multiple Vitamins-Minerals (MULTIVITAMIN WITH MINERALS) tablet Take 1 tablet by mouth daily.     OVER THE COUNTER MEDICATION Vitamin B 12, 1000 mcg one tablet daily.     OVER THE COUNTER MEDICATION Vitamin D 3 one capsule daily.     OVER THE COUNTER MEDICATION Nasocort nasal spray, as needed for allergies.     fexofenadine (ALLEGRA) 60 MG tablet Take 60 mg by mouth as needed for allergies or rhinitis. (Patient not taking: Reported on 04/02/2021)     No current facility-administered medications for this visit.     Past Medical History:  Diagnosis Date   Allergy    Aortic root aneurysm  01/11/2018   4.7 centimeters at sinuses of Valsalva, 4.2 cm mid ascending   Arthritis    GERD (gastroesophageal reflux disease)    History of repair of hiatal hernia 2001   Sleep apnea    On CPAP    Past Surgical History:  Procedure Laterality Date   CLOSED REDUCTION HAND FRACTURE Right    LEFT HEART CATH AND CORS/GRAFTS ANGIOGRAPHY N/A 01/26/2018   Procedure: LEFT HEART CATH AND CORS/GRAFTS ANGIOGRAPHY;  Surgeon: Lorretta Harp, MD;  Location: [facility];  Service: Cardiovascular;  Laterality: N/A;   NISSEN FUNDOPLICATION  [id number]   Complete in [place], [place]    Social History   Socioeconomic History   Marital status: Married    Spouse name: Not on file   Number of children: Not on file   Years of education: Not on file   Highest education level: Not on file  Occupational History   Not on file  Tobacco Use   Smoking status: Never   Smokeless tobacco: Never  Vaping Use   Vaping Use: Never used  Substance and Sexual Activity   Alcohol use: Never   Drug use: Never   Sexual activity: Not on file  Other Topics Concern   Not on file  Social History Narrative   Not on file   Social Determinants of Health   Financial Resource Strain: Not on file  Food Insecurity: Not on file  Transportation Needs:  Not on file  Physical Activity: Not on file  Stress: Not on file  Social Connections: Not on file  Intimate Partner Violence: Not on file    Family History  Problem Relation Age of Onset   Coronary artery disease Father        s/p CABGx3, Valve replacement   Aortic aneurysm Father        repaired   Atrial fibrillation Mother    Transient ischemic attack Mother    Colon cancer Neg Hx    Esophageal cancer Neg Hx    Rectal cancer Neg Hx    Stomach cancer Neg Hx    Liver disease Neg Hx     ROS: no fevers or chills, productive cough, hemoptysis, dysphasia, odynophagia, melena, hematochezia, dysuria, hematuria, rash, seizure activity, orthopnea, PND, pedal  edema, claudication. Remaining systems are negative.  Physical Exam: Well-developed well-nourished in no acute distress.  Skin is warm and dry.  HEENT is normal.  Neck is supple.  Chest is clear to auscultation with normal expansion.  Cardiovascular exam is regular rate and rhythm.  Abdominal exam nontender or distended. No masses palpated. Extremities show no edema. neuro grossly intact  ECG-sinus bradycardia at a rate of 52, just.  First-degree AV block.  Personally reviewed  A/P  1 palpitations-etiology unclear.  He is in sinus rhythm today.  If he has more frequent episodes in the future could consider monitor.  We also discussed the apple watch to potentially document any rhythm disturbances associated with his symptoms.  2 thoracic aortic aneurysm-followed by Dr. Roxan Hockey.  He will need follow-up CTA April 2023.  3 chest pain-previous catheterization revealed normal coronary arteries.  Electrocardiogram shows no new ST changes.  Symptoms atypical.  Will not pursue further ischemia evaluation.  4 elevated blood pressure-patient states his blood pressure was elevated Saturday.  He resumed metoprolol 12.5 mg twice daily but has had some problems with the ED with this in the past.  I have asked him to discontinue this medication.  He will check his blood pressure at home and keep records.  If it increases we could add a different agent in the future.  Given his thoracic aortic aneurysm an ARB would likely be a good choice.  Kirk Ruths, MD

## 2021-03-31 NOTE — Telephone Encounter (Signed)
Spoke with pt regarding elevated blood pressure. Pt was seen 12/23/20 by Erasmo Downer regarding weaning off metoprolol.  Pt noticed on Saturday after doing yard work that he had a headache and he felt like his heart was pounding and racing, at this time pt took his blood pressure with results 160/140. Pt states that he has some metoprolol left over and took 25mg . Pt states that the headache continued into Sunday so he took 12.5mg  of metoprolol in the morning and evening. Blood pressure results in the evening were 160/125. Pt took his blood pressure today- 140/88 HR 90.  Pt states that he is concerned about a-fib as well. He states that he feels like his heart rate may be irregular. Appointment made for pt to be seen by DOD Stanford Breed) on Wednesday 04/02/21. Will forward message to pharmacy team and Dr. Gwenlyn Found to further advise.

## 2021-03-31 NOTE — Telephone Encounter (Signed)
Spoke to pt. Recommendations from [place] relayed. Pt verbalizes understanding.

## 2021-03-31 NOTE — Telephone Encounter (Signed)
Have him continue with metoprolol 12.5 mg bid until he is seen on Wednesday

## 2021-04-02 ENCOUNTER — Encounter: Payer: Self-pay | Admitting: Cardiology

## 2021-04-02 ENCOUNTER — Other Ambulatory Visit: Payer: Self-pay

## 2021-04-02 ENCOUNTER — Ambulatory Visit (INDEPENDENT_AMBULATORY_CARE_PROVIDER_SITE_OTHER): Payer: Medicare Other | Admitting: Cardiology

## 2021-04-02 VITALS — BP 112/76 | HR 52 | Ht 71.0 in | Wt 219.4 lb

## 2021-04-02 DIAGNOSIS — I208 Other forms of angina pectoris: Secondary | ICD-10-CM | POA: Diagnosis not present

## 2021-04-02 DIAGNOSIS — I7121 Aneurysm of the ascending aorta, without rupture: Secondary | ICD-10-CM | POA: Diagnosis not present

## 2021-04-02 DIAGNOSIS — R072 Precordial pain: Secondary | ICD-10-CM | POA: Diagnosis not present

## 2021-04-02 DIAGNOSIS — R002 Palpitations: Secondary | ICD-10-CM

## 2021-04-02 DIAGNOSIS — Q2543 Congenital aneurysm of aorta: Secondary | ICD-10-CM

## 2021-04-02 NOTE — Patient Instructions (Addendum)
  Follow-Up: At [facility], you and your health needs are our priority.  As part of our continuing mission to provide you with exceptional heart care, we have created designated Provider Care Teams.  These Care Teams include your primary Cardiologist (physician) and Advanced Practice Providers (APPs -  Physician Assistants and Nurse Practitioners) who all work together to provide you with the care you need, when you need it.  We recommend signing up for the patient portal called "MyChart".  Sign up information is provided on this After Visit Summary.  MyChart is used to connect with patients for Virtual Visits (Telemedicine).  Patients are able to view lab/test results, encounter notes, upcoming appointments, etc.  Non-urgent messages can be sent to your provider as well.   To learn more about what you can do with MyChart, go to [email or web address].    Your next appointment:   6 -8 week(s)  The format for your next appointment:   In Person  Provider:   Coletta Memos, FNP or Sande Rives, PA-C    Then, Quay Burow MD will plan to see you again as scheduled

## 2021-05-20 NOTE — Progress Notes (Deleted)
Cardiology Office Note:    Date:  05/20/2021   ID:  Marvin Harvey, DOB [birth year], MRN [id number]  PCP:  Sherrilee Gilles, DO  Cardiologist:  Quay Burow, MD  Electrophysiologist:  None   Referring MD: Sherrilee Gilles, DO   Chief Complaint: follow-up of chest pain and palpitations  History of Present Illness:    Marvin Harvey is a 65 y.o. 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 Gwenlyn Found and presents today for follow-up of chest pain and palpitations.  Patient previously followed by Cardiology in [place], [place] for aortic root aneurysm. He was admitted in 01/2018 for chest pain. Echo showed LVEF of 65-70% with normal wall motion, grade 2 diastolic dysfunction, mild AI, an mildly dilated ascending aorta. Left cardiac catheterization showed normal coronaries. Chest pain was felt to be non-cardiac in nature. Patient follows with Dr. Roxan Hockey for his aortic aneurysm. Last chest CTA in 08/2020 showed a stable 4.3cm fusiform anuerysmal dilation of the ascending thoracic aorta and stable ectasia of the aortic root measuring 4.5cm.   Patient was added onto Dr. Jacalyn Lefevre scheduled on 04/02/2021 for further evaluation of palpitations and concern for atrial fibrillation. He also reported several days of persistent chest tightness and occasional sharp chest pain lasting 1-2 seconds at a time. EKG at visit showed sinus bradycardia, rate 52 bpm, with no acute ischemic changes. Chest pain felt to be atypical and given normal coronaries on LHC in 2019, no additional work up was felt to be necessary. Plan was for monitor if palpitations worsen. Lopressor was stopped due to problems with erectile dysfunction in the past. Patient was advised to monitor at home with plans to add ARB if BP increases.  Patient presents today for follow-up.  ***  Aortic Root Aneurysm Thoracic Aorta Aneurysm Last CTA in 08/2020 showed a stable 4.3cm fusiform anuerysmal dilation of the ascending thoracic aorta and stable ectasia of the aortic root measuring 4.5cm. - Followed by Dr. Roxan Hockey. Plans is for repeat CTA in 08/2021.  Chest Pain Patient reported atypical chest pain at last office visit in 03/2021. EKG showed no acute ischemic changes. Cardiac cath in 2019 showed normal coronaries so no additional work-up was felt to be necessary.  - ***  Palpitations - ***  Mild Aortic Insufficiency Noted on Echo in 01/2018.  - Will order repeat Echo for routine monitoring of this.   Obstructive Sleep Apnea Continue CPAP.  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   4.7 centimeters at sinuses of Valsalva, 4.2 cm mid ascending   Arthritis    GERD (gastroesophageal reflux disease)    History of repair of hiatal hernia 2001   Sleep apnea    On CPAP    Past Surgical History:  Procedure Laterality Date   CLOSED REDUCTION HAND FRACTURE Right    LEFT HEART CATH AND CORS/GRAFTS ANGIOGRAPHY N/A 01/26/2018   Procedure: LEFT HEART CATH AND CORS/GRAFTS ANGIOGRAPHY;  Surgeon: Lorretta Harp, MD;  Location: [facility];  Service: Cardiovascular;  Laterality: N/A;   NISSEN FUNDOPLICATION  [id number]   Complete in [place], [place]    Current Medications: No outpatient medications have been marked as taking for the 05/28/21 encounter (Appointment) with Darreld Mclean, PA-C.     Allergies:   Patient has no known allergies.   Social  History   Socioeconomic History   Marital status: Married    Spouse name: Not on file   Number of children: Not on file   Years of education: Not on file   Highest education level: Not on file  Occupational History   Not on file  Tobacco Use   Smoking status: Never   Smokeless tobacco: Never  Vaping Use   Vaping Use: Never used  Substance and Sexual Activity    Alcohol use: Never   Drug use: Never   Sexual activity: Not on file  Other Topics Concern   Not on file  Social History Narrative   Not on file   Social Determinants of Health   Financial Resource Strain: Not on file  Food Insecurity: Not on file  Transportation Needs: Not on file  Physical Activity: Not on file  Stress: Not on file  Social Connections: Not on file     Family History: The patient's family history includes Aortic aneurysm in his father; Atrial fibrillation in his mother; Coronary artery disease in his father; Transient ischemic attack in his mother. There is no history of Colon cancer, Esophageal cancer, Rectal cancer, Stomach cancer, or Liver disease.  ROS:   Please see the history of present illness.     EKGs/Labs/Other Studies Reviewed:    The following studies were reviewed today:  Echocardiogram 01/26/2018: Study Conclusions: - Left ventricle: The cavity size was normal. Wall thickness was    normal. Systolic function was vigorous. The estimated ejection    fraction was in the range of 65% to 70%. Wall motion was normal;    there were no regional wall motion abnormalities. Features are    consistent with a pseudonormal left ventricular filling pattern,    with concomitant abnormal relaxation and increased filling    pressure (grade 2 diastolic dysfunction).  - Aortic valve: There was mild regurgitation. Valve area (VTI):    2.84 cm^2. Valve area (Vmax): 2.89 cm^2. Valve area (Vmean): 2.96    cm^2.  _______________  Left Cardiac Catheterization 01/26/2018: The left ventricular systolic function is normal. LV end diastolic pressure is normal. The left ventricular ejection fraction is 55-65% by visual estimate.  Impressions: Mr. Procter has normal coronary arteries and normal LV function.  I believe his chest pain is noncardiac.  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  His right femoral arterial  puncture site was hemostatic hemostatically sealed with a MYNX device successfully.  He left the lab in stable condition.  He will be gently hydrated overnight, and discharged home in the morning. _______________  Chest CTA 08/27/2020: Impression: 1. Stable 4.3 cm fusiform aneurysmal dilation of the ascending thoracic aorta. Recommend annual imaging followup by CTA or MRA.This recommendation follows 2010 ACCF/AHA/AATS/ACR/ASA/SCA/SCAI/SIR/STS/SVM Guidelines for the Diagnosis and Management of Patients with Thoracic Aortic Disease. Circulation. 2010; 121: K160-F093. Aortic aneurysm NOS (ICD10-I71.9) 2. Stable ectasia of the aortic root at 4.5 cm. 3. Moderately large hiatal hernia. 4. Multifocal bilateral renal cysts.  EKG:  EKG not ordered today.   Recent Labs: No results found for requested labs within last 8760 hours.  Recent Lipid Panel No results found for: CHOL, TRIG, HDL, CHOLHDL, VLDL, LDLCALC, LDLDIRECT  Physical Exam:    Vital Signs: There were no vitals taken for this visit.    Wt Readings from Last 3 Encounters:  04/02/21 219 lb 6.4 oz (99.5 kg)  12/23/20 221 lb 9.6 oz (100.5 kg)  10/18/20  221 lb 9.6 oz (100.5 kg)     General: 65 y.o. male in no acute distress. HEENT: Normocephalic and atraumatic. Sclera clear. EOMs intact. Neck: Supple. No carotid bruits. No JVD. Heart: *** RRR. Distinct S1 and S2. No murmurs, gallops, or rubs. Radial and distal pedal pulses 2+ and equal bilaterally. Lungs: No increased work of breathing. Clear to ausculation bilaterally. No wheezes, rhonchi, or rales.  Abdomen: Soft, non-distended, and non-tender to palpation. Bowel sounds present in all 4 quadrants.  MSK: Normal strength and tone for age. *** Extremities: No lower extremity edema.    Skin: Warm and dry. Neuro: Alert and oriented x3. No focal deficits. Psych: Normal affect. Responds appropriately.   Assessment:    No diagnosis found.  Plan:     Disposition: Follow up in  ***   Medication Adjustments/Labs and Tests Ordered: Current medicines are reviewed at length with the patient today.  Concerns regarding medicines are outlined above.  No orders of the defined types were placed in this encounter.  No orders of the defined types were placed in this encounter.   There are no Patient Instructions on file for this visit.   Signed, Darreld Mclean, PA-C  05/20/2021 10:56 AM    [facility]

## 2021-05-28 ENCOUNTER — Ambulatory Visit: Payer: Medicare Other | Admitting: Student

## 2021-06-23 ENCOUNTER — Other Ambulatory Visit: Payer: Self-pay

## 2021-06-23 ENCOUNTER — Ambulatory Visit (AMBULATORY_SURGERY_CENTER): Payer: Medicare Other | Admitting: *Deleted

## 2021-06-23 VITALS — Ht 71.0 in | Wt 210.0 lb

## 2021-06-23 DIAGNOSIS — Z86010 Personal history of colonic polyps: Secondary | ICD-10-CM

## 2021-06-23 NOTE — Progress Notes (Signed)
No egg or soy allergy known to patient  No issues known to pt with past sedation with any surgeries or procedures Patient denies ever being told they had issues or difficulty with intubation  No FH of Malignant Hyperthermia Pt is not on diet pills Pt is not on  home 02  Pt is not on blood thinners  Pt denies issues with constipation  No A fib or A flutter  Pt had plenvu sample at home.    Due to the COVID-19 pandemic we are asking patients to follow certain guidelines in PV and the [place]   Pt aware of COVID protocols and [facility] guidelines   PV completed over the phone. Pt verified name, DOB, address and insurance during PV today.  Pt mailed instruction packet with copy of consent form to read and not return, and instructions.  Pt encouraged to call with questions or issues.

## 2021-07-07 ENCOUNTER — Ambulatory Visit (AMBULATORY_SURGERY_CENTER): Payer: Medicare Other | Admitting: Gastroenterology

## 2021-07-07 ENCOUNTER — Other Ambulatory Visit: Payer: Self-pay

## 2021-07-07 ENCOUNTER — Encounter: Payer: Self-pay | Admitting: Gastroenterology

## 2021-07-07 VITALS — BP 121/76 | HR 58 | Temp 98.0°F | Resp 17 | Ht 71.0 in | Wt 210.0 lb

## 2021-07-07 DIAGNOSIS — Z86010 Personal history of colonic polyps: Secondary | ICD-10-CM

## 2021-07-07 DIAGNOSIS — D123 Benign neoplasm of transverse colon: Secondary | ICD-10-CM | POA: Diagnosis not present

## 2021-07-07 DIAGNOSIS — D128 Benign neoplasm of rectum: Secondary | ICD-10-CM | POA: Diagnosis not present

## 2021-07-07 DIAGNOSIS — K621 Rectal polyp: Secondary | ICD-10-CM | POA: Diagnosis not present

## 2021-07-07 MED ORDER — SODIUM CHLORIDE 0.9 % IV SOLN: 500.0000 mL | Freq: Once | INTRAVENOUS | Status: DC

## 2021-07-07 NOTE — Op Note (Signed)
[facility] Patient Name: Marvin Harvey Procedure Date: 07/07/2021 9:46 AM MRN: [id number] Endoscopist: Mauri Pole , MD Age: 66 Referring MD:  Date of Birth: [birth year] Gender: Male Account #: [id number] Procedure:                Colonoscopy Indications:              High risk colon cancer surveillance: Personal                            history of colonic polyps, High risk colon cancer                            surveillance: Personal history of multiple (3 or                            more) adenomas Medicines:                Monitored Anesthesia Care Procedure:                Pre-Anesthesia Assessment:                           - Prior to the procedure, a History and Physical                            was performed, and patient medications and                            allergies were reviewed. The patient's tolerance of                            previous anesthesia was also reviewed. The risks                            and benefits of the procedure and the sedation                            options and risks were discussed with the patient.                            All questions were answered, and informed consent                            was obtained. Prior Anticoagulants: The patient has                            taken no previous anticoagulant or antiplatelet                            agents. ASA Grade Assessment: II - A patient with                            mild systemic disease. After reviewing the risks  and benefits, the patient was deemed in                            satisfactory condition to undergo the procedure.                           After obtaining informed consent, the colonoscope                            was passed under direct vision. Throughout the                            procedure, the patient's blood pressure, pulse, and                            oxygen saturations were monitored continuously. The                             [id number]-HQ190L Colonoscope was introduced through the                            anus and advanced to the the cecum, identified by                            appendiceal orifice and ileocecal valve. The                            colonoscopy was performed without difficulty. The                            patient tolerated the procedure well. The quality                            of the bowel preparation was good. The ileocecal                            valve, appendiceal orifice, and rectum were                            photographed. Scope In: 9:51:44 AM Scope Out: 10:10:34 AM Scope Withdrawal Time: 0 hours 13 minutes 24 seconds  Total Procedure Duration: 0 hours 18 minutes 50 seconds  Findings:                 The perianal and digital rectal examinations were                            normal.                           Five sessile polyps were found in the rectum X2 and                            transverse colon X3. The polyps were 4 to 7 mm in  size. These polyps were removed with a cold snare.                            Resection and retrieval were complete.                           Non-bleeding external and internal hemorrhoids were                            found during retroflexion. The hemorrhoids were                            medium-sized. Complications:            No immediate complications. Estimated Blood Loss:     Estimated blood loss was minimal. Impression:               - Five 4 to 7 mm polyps in the rectum and in the                            transverse colon, removed with a cold snare.                            Resected and retrieved.                           - Non-bleeding external and internal hemorrhoids. Recommendation:           - Patient has a contact number available for                            emergencies. The signs and symptoms of potential                            delayed complications were  discussed with the                            patient. Return to normal activities tomorrow.                            Written discharge instructions were provided to the                            patient.                           - Resume previous diet.                           - Continue present medications.                           - Await pathology results.                           - Repeat colonoscopy in 3 - 5 years for  surveillance. Mauri Pole, MD 07/07/2021 10:19:06 AM This report has been signed electronically.

## 2021-07-07 NOTE — Progress Notes (Signed)
Called to room to assist during endoscopic procedure.  Patient ID and intended procedure confirmed with present staff. Received instructions for my participation in the procedure from the performing physician.  

## 2021-07-07 NOTE — Patient Instructions (Signed)
Resume previous diet and medications. Awaiting pathology results. Repeat Colonoscopy in 3-5 years for surveillance.  YOU HAD AN ENDOSCOPIC PROCEDURE TODAY AT [facility]:   Refer to the procedure report that was given to you for any specific questions about what was found during the examination.  If the procedure report does not answer your questions, please call your gastroenterologist to clarify.  If you requested that your care partner not be given the details of your procedure findings, then the procedure report has been included in a sealed envelope for you to review at your convenience later.  YOU SHOULD EXPECT: Some feelings of bloating in the abdomen. Passage of more gas than usual.  Walking can help get rid of the air that was put into your GI tract during the procedure and reduce the bloating. If you had a lower endoscopy (such as a colonoscopy or flexible sigmoidoscopy) you may notice spotting of blood in your stool or on the toilet paper. If you underwent a bowel prep for your procedure, you may not have a normal bowel movement for a few days.  Please Note:  You might notice some irritation and congestion in your nose or some drainage.  This is from the oxygen used during your procedure.  There is no need for concern and it should clear up in a day or so.  SYMPTOMS TO REPORT IMMEDIATELY:  Following lower endoscopy (colonoscopy or flexible sigmoidoscopy):  Excessive amounts of blood in the stool  Significant tenderness or worsening of abdominal pains  Swelling of the abdomen that is new, acute  Fever of 100F or higher  For urgent or emergent issues, a gastroenterologist can be reached at any hour by calling [phone]. Do not use MyChart messaging for urgent concerns.    DIET:  We do recommend a small meal at first, but then you may proceed to your regular diet.  Drink plenty of fluids but you should avoid alcoholic beverages for 24 hours.  ACTIVITY:  You should  plan to take it easy for the rest of today and you should NOT DRIVE or use heavy machinery until tomorrow (because of the sedation medicines used during the test).    FOLLOW UP: Our staff will call the number listed on your records 48-72 hours following your procedure to check on you and address any questions or concerns that you may have regarding the information given to you following your procedure. If we do not reach you, we will leave a message.  We will attempt to reach you two times.  During this call, we will ask if you have developed any symptoms of COVID 19. If you develop any symptoms (ie: fever, flu-like symptoms, shortness of breath, cough etc.) before then, please call [phone].  If you test positive for Covid 19 in the 2 weeks post procedure, please call and report this information to [place].    If any biopsies were taken you will be contacted by phone or by letter within the next 1-3 weeks.  Please call us at [phone] if you have not heard about the biopsies in 3 weeks.    SIGNATURES/CONFIDENTIALITY: You and/or your care partner have signed paperwork which will be entered into your electronic medical record.  These signatures attest to the fact that that the information above on your After Visit Summary has been reviewed and is understood.  Full responsibility of the confidentiality of this discharge information lies with you and/or your care-partner.

## 2021-07-07 NOTE — Progress Notes (Signed)
A and O x3. Report to RN. Tolerated MAC anesthesia well. 

## 2021-07-07 NOTE — Progress Notes (Signed)
[facility] History and Physical   Primary Care Physician:  Sherrilee Gilles, DO   Reason for Procedure:  History of adenomatous colon polyps  Plan:    Surveillance colonoscopy with possible interventions as needed     HPI: Marvin Harvey is a very pleasant 66 y.o. male here for surveillance colonoscopy. Denies any nausea, vomiting, abdominal pain, melena or bright red blood per rectum  The risks and benefits as well as alternatives of endoscopic procedure(s) have been discussed and reviewed. All questions answered. The patient agrees to proceed.    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   4.7 centimeters at sinuses of Valsalva, 4.2 cm mid ascending   Arthritis    Cataract    BILATERAL   GERD (gastroesophageal reflux disease)    History of repair of hiatal hernia 2001   Sleep apnea    On CPAP    Past Surgical History:  Procedure Laterality Date   CLOSED REDUCTION HAND FRACTURE Right    COLONOSCOPY     LEFT HEART CATH AND CORS/GRAFTS ANGIOGRAPHY N/A 01/26/2018   Procedure: LEFT HEART CATH AND CORS/GRAFTS ANGIOGRAPHY;  Surgeon: Lorretta Harp, MD;  Location: [facility];  Service: Cardiovascular;  Laterality: N/A;   NISSEN FUNDOPLICATION  [id number]   Complete in [place], [place]   POLYPECTOMY      Prior to Admission medications   Medication Sig Start Date End Date Taking? Authorizing Provider  allopurinol (ZYLOPRIM) 300 MG tablet Take 450 mg by mouth every evening.  11/01/17   [provider]  aspirin EC 81 MG tablet Take 81 mg by mouth daily. Patient not taking: Reported on 06/23/2021    [provider]  fexofenadine (ALLEGRA) 60 MG tablet Take 60 mg by mouth as needed for allergies or rhinitis. Patient not taking: Reported on 04/02/2021    [provider]  Multiple Vitamins-Minerals (MULTIVITAMIN WITH MINERALS) tablet Take 1 tablet by mouth daily. Patient not taking:  Reported on 06/23/2021    [provider]  OVER THE COUNTER MEDICATION Vitamin B 12, 1000 mcg one tablet daily. Patient not taking: Reported on 06/23/2021    [provider]  OVER THE COUNTER MEDICATION Vitamin D 3 one capsule daily. Patient not taking: Reported on 06/23/2021    [provider]  OVER THE COUNTER MEDICATION Nasocort nasal spray, as needed for allergies. Patient not taking: Reported on 06/23/2021    [provider]    Current Outpatient Medications  Medication Sig Dispense Refill   allopurinol (ZYLOPRIM) 300 MG tablet Take 450 mg by mouth every evening.   1   aspirin EC 81 MG tablet Take 81 mg by mouth daily. (Patient not taking: Reported on 06/23/2021)     fexofenadine (ALLEGRA) 60 MG tablet Take 60 mg by mouth as needed for allergies or rhinitis. (Patient not taking: Reported on 04/02/2021)     Multiple Vitamins-Minerals (MULTIVITAMIN WITH MINERALS) tablet Take 1 tablet by mouth daily. (Patient not taking: Reported on 06/23/2021)     OVER THE COUNTER MEDICATION Vitamin B 12, 1000 mcg one tablet daily. (Patient not taking: Reported on 06/23/2021)     OVER THE COUNTER MEDICATION Vitamin D 3 one capsule daily. (Patient not taking: Reported on 06/23/2021)     OVER THE COUNTER MEDICATION Nasocort nasal spray, as needed for allergies. (Patient not taking: Reported on 06/23/2021)     Current Facility-Administered Medications  Medication Dose Route Frequency  Provider Last Rate Last Admin   0.9 %  sodium chloride infusion  500 mL Intravenous Once Mauri Pole, MD        Allergies as of 07/07/2021   (No Known Allergies)    Family History  Problem Relation Age of Onset   Atrial fibrillation Mother    Transient ischemic attack Mother    Colon polyps Father    Coronary artery disease Father        s/p CABGx3, Valve replacement   Aortic aneurysm Father        repaired   Colon cancer Neg Hx    Esophageal cancer Neg Hx    Rectal cancer Neg Hx     Stomach cancer Neg Hx    Liver disease Neg Hx     Social History   Socioeconomic History   Marital status: Married    Spouse name: Not on file   Number of children: Not on file   Years of education: Not on file   Highest education level: Not on file  Occupational History   Not on file  Tobacco Use   Smoking status: Never   Smokeless tobacco: Never  Vaping Use   Vaping Use: Never used  Substance and Sexual Activity   Alcohol use: Never   Drug use: Never   Sexual activity: Not on file  Other Topics Concern   Not on file  Social History Narrative   Not on file   Social Determinants of Health   Financial Resource Strain: Not on file  Food Insecurity: Not on file  Transportation Needs: Not on file  Physical Activity: Not on file  Stress: Not on file  Social Connections: Not on file  Intimate Partner Violence: Not on file    Review of Systems:  All other review of systems negative except as mentioned in the HPI.  Physical Exam: Vital signs in last 24 hours: BP 111/60    Pulse 65    Temp 98 F (36.7 C)    Ht 5\' 11"  (1.803 m)    Wt 210 lb (95.3 kg)    SpO2 98%    BMI 29.29 kg/m  General:   Alert, NAD Lungs:  Clear .   Heart:  Regular rate and rhythm Abdomen:  Soft, nontender and nondistended. Neuro/Psych:  Alert and cooperative. Normal mood and affect. A and O x 3  Reviewed labs, radiology imaging, old records and pertinent past GI work up  Patient is appropriate for planned procedure(s) and anesthesia in an ambulatory setting   K. Denzil Magnuson , MD [phone]

## 2021-07-07 NOTE — Progress Notes (Signed)
Vitals by [employer].

## 2021-07-09 ENCOUNTER — Telehealth: Payer: Self-pay | Admitting: *Deleted

## 2021-07-09 NOTE — Telephone Encounter (Signed)
°  Follow up Call-  Call back number 07/07/2021  Post procedure Call Back phone  # [phone]  Permission to leave phone message Yes  Some recent data might be hidden     Patient questions:  Do you have a fever, pain , or abdominal swelling? No. Pain Score  0 *  Have you tolerated food without any problems? Yes.    Have you been able to return to your normal activities? Yes.    Do you have any questions about your discharge instructions: Diet   No. Medications  No. Follow up visit  No.  Do you have questions or concerns about your Care? No.  Actions: * If pain score is 4 or above: No action needed, pain <4.

## 2021-07-15 ENCOUNTER — Encounter: Payer: Self-pay | Admitting: Gastroenterology

## 2021-07-28 ENCOUNTER — Other Ambulatory Visit: Payer: Self-pay | Admitting: Thoracic Surgery (Cardiothoracic Vascular Surgery)

## 2021-07-28 DIAGNOSIS — I712 Thoracic aortic aneurysm, without rupture, unspecified: Secondary | ICD-10-CM

## 2021-09-02 ENCOUNTER — Other Ambulatory Visit: Payer: Medicare Other

## 2021-09-02 ENCOUNTER — Ambulatory Visit: Payer: Medicare Other | Admitting: Thoracic Surgery (Cardiothoracic Vascular Surgery)

## 2021-09-23 ENCOUNTER — Ambulatory Visit
Admission: RE | Admit: 2021-09-23 | Discharge: 2021-09-23 | Disposition: A | Discharge status: Home / Self Care | Payer: Medicare Other | Source: Ambulatory Visit | Attending: Thoracic Surgery (Cardiothoracic Vascular Surgery) | Admitting: Thoracic Surgery (Cardiothoracic Vascular Surgery)

## 2021-09-23 ENCOUNTER — Ambulatory Visit (INDEPENDENT_AMBULATORY_CARE_PROVIDER_SITE_OTHER): Payer: Medicare Other | Admitting: Surgical

## 2021-09-23 VITALS — BP 148/80 | HR 55 | Resp 20 | Ht 71.0 in | Wt 217.0 lb

## 2021-09-23 DIAGNOSIS — I712 Thoracic aortic aneurysm, without rupture, unspecified: Secondary | ICD-10-CM

## 2021-09-23 DIAGNOSIS — M19049 Primary osteoarthritis, unspecified hand: Secondary | ICD-10-CM | POA: Insufficient documentation

## 2021-09-23 DIAGNOSIS — Z6831 Body mass index (BMI) 31.0-31.9, adult: Secondary | ICD-10-CM | POA: Insufficient documentation

## 2021-09-23 DIAGNOSIS — I7121 Aneurysm of the ascending aorta, without rupture: Secondary | ICD-10-CM | POA: Insufficient documentation

## 2021-09-23 DIAGNOSIS — M109 Gout, unspecified: Secondary | ICD-10-CM | POA: Insufficient documentation

## 2021-09-23 DIAGNOSIS — K2100 Gastro-esophageal reflux disease with esophagitis, without bleeding: Secondary | ICD-10-CM | POA: Insufficient documentation

## 2021-09-23 MED ORDER — IOPAMIDOL (ISOVUE-370) INJECTION 76%
75.0000 mL | Freq: Once | INTRAVENOUS | Status: AC | PRN
  Administered 2021-09-23: 75 mL via INTRAVENOUS

## 2021-09-23 NOTE — Patient Instructions (Signed)
Discussed activity and lifestyle management. ?

## 2021-09-23 NOTE — Progress Notes (Signed)
? ? ?Subjective:  ? ?  ?Patient ID: Marvin Harvey, male    DOB: April 22, 1956, 66 y.o.   MRN: [id number] ? ?Chief Complaint  ?Patient presents with  ? Thoracic Aortic Aneurysm  ?  1 year f/u with CTA Chest  ? ? ?HPI the patient is a 66 year old male has been followed for thoracic aortic aneurysm by Dr. Roxan Hockey.  He is seen today in follow-up after recent scan done on today's date.  See the report is listed below.  The measurements are very stable.  Continues to have some symptoms related to his previous Nissen fundoplication.  He does not have any reflux primarily symptoms are chest discomfort with occasional feeling that food is not passing easily into the stomach.  Overall the symptoms are mild and very much intermittent and has not limited lifestyle enough that he feels it needs to be repaired anytime soon.  He does have occasional palpitations.  He denies shortness of breath.  He does see Dr. Gwenlyn Found for cardiology care.  His blood pressure is noted to be a little bit elevated today but he states that it runs better and in fact he was recently discontinued from his blood pressure medication. ? ? ? ?   ?Objective:  ?  ?BP (!) 148/80   Pulse (!) 55   Resp 20   Ht '5\' 11"'$  (1.803 m)   Wt 217 lb (98.4 kg)   SpO2 96% Comment: RA  BMI 30.27 kg/m?  ?BP Readings from Last 3 Encounters:  ?09/23/21 (!) 148/80  ?07/07/21 121/76  ?04/02/21 112/76  ? ?  ? ?Physical Exam ?Constitutional:   ?   Appearance: Normal appearance. He is normal weight.  ?HENT:  ?   Head: Normocephalic and atraumatic.  ?Cardiovascular:  ?   Rate and Rhythm: Normal rate and regular rhythm.  ?   Heart sounds: No murmur heard. ?  No friction rub. No gallop.  ?Pulmonary:  ?   Effort: Pulmonary effort is normal.  ?   Breath sounds: Normal breath sounds.  ?Abdominal:  ?   General: Abdomen is flat.  ?   Palpations: Abdomen is soft.  ?Skin: ?   General: Skin is warm and dry.  ?Neurological:  ?   General: No focal deficit present.  ?   Mental Status: He is  alert.  ?Psychiatric:     ?   Mood and Affect: Mood normal.     ?   Behavior: Behavior normal.  ? ? ?No results found for any visits on 09/23/21. ? ? ?   ?Assessment & Plan:  ? ?Problem List Items Addressed This Visit   ?None ?Visit Diagnoses   ? ? Thoracic aortic aneurysm without rupture, unspecified part ([place])    -  Primary  ? ?  ? ?Narrative & Impression  ?CLINICAL DATA:  Thoracic aortic aneurysm. ?  ?EXAM: ?CT ANGIOGRAPHY CHEST WITH CONTRAST ?  ?TECHNIQUE: ?Multidetector CT imaging of the chest was performed using the ?standard protocol during bolus administration of intravenous ?contrast. Multiplanar CT image reconstructions and MIPs were ?obtained to evaluate the vascular anatomy. ?  ?RADIATION DOSE REDUCTION: This exam was performed according to the ?departmental dose-optimization program which includes automated ?exposure control, adjustment of the mA and/or kV according to ?patient size and/or use of iterative reconstruction technique. ?  ?CONTRAST:  78m ISOVUE-370 IOPAMIDOL (ISOVUE-370) INJECTION 76% ?  ?COMPARISON:  CT scan of August 27, 2020. ?  ?FINDINGS: ?Cardiovascular: Grossly stable 4.3 cm ascending thoracic aortic ?aneurysm is noted.  No dissection is noted. Great vessels are widely ?patent without significant stenosis. Transverse aortic arch measures ?3.0 cm. Descending thoracic aorta measures 3.0 cm is well. Aortic ?root measures 4.7 cm in diameter. Normal cardiac size. No ?pericardial effusion. ?  ?Mediastinum/Nodes: Moderate size sliding-type hiatal hernia is ?noted. No adenopathy is noted. Thyroid gland is unremarkable. ?  ?Lungs/Pleura: Lungs are clear. No pleural effusion or pneumothorax. ?  ?Upper Abdomen: No acute abnormality. ?  ?Musculoskeletal: No chest wall abnormality. No acute or significant ?osseous findings. ?  ?Review of the MIP images confirms the above findings. ?  ?IMPRESSION: ?Grossly stable 4.3 cm ascending thoracic aortic aneurysm. Aortic ?root is dilated at 4.7 cm. Recommend  annual imaging followup by CTA ?or MRA. This recommendation follows 2010 ?ACCF/AHA/AATS/ACR/ASA/SCA/SCAI/SIR/STS/SVM Guidelines for the ?Diagnosis and Management of Patients with Thoracic Aortic Disease. ?Circulation. 2010; 121: V916-O060. Aortic aneurysm NOS ?(ICD10-I71.9). ?  ?Moderate size sliding-type hiatal hernia. ?  ?  ?Electronically Signed ?  By: Marijo Conception M.D. ?  On: 09/23/2021 11:03  ? ? ?No orders of the defined types were placed in this encounter. ?A/P: Patient is thoracic aneurysm appears stable and we will continue with yearly CTA of the chest to monitor.  He does note that his father had thoracic aneurysm repair at age 28.  We discussed activity and lifestyle management and emphasized blood pressure control. ? ?No follow-ups on file. ? ?John Giovanni, PA-C ? ? ?

## 2022-02-25 ENCOUNTER — Encounter: Payer: Self-pay | Admitting: Cardiovascular Disease

## 2022-02-25 ENCOUNTER — Ambulatory Visit: Payer: Medicare Other | Attending: Cardiovascular Disease | Admitting: Cardiovascular Disease

## 2022-02-25 DIAGNOSIS — I7121 Aneurysm of the ascending aorta, without rupture: Secondary | ICD-10-CM | POA: Insufficient documentation

## 2022-02-25 NOTE — Patient Instructions (Signed)
Medication Instructions:  Your physician recommends that you continue on your current medications as directed. Please refer to the Current Medication list given to you today.  *If you need a refill on your cardiac medications before your next appointment, please call your pharmacy*   Testing/Procedures: Non-Cardiac CT Angiography (CTA) of aorta, is a special type of CT scan that uses a computer to produce multi-dimensional views of major blood vessels throughout the body. In CT angiography, a contrast material is injected through an IV to help visualize the blood vessels. To be done May 2024.    Follow-Up: At [facility], you and your health needs are our priority.  As part of our continuing mission to provide you with exceptional heart care, we have created designated Provider Care Teams.  These Care Teams include your primary Cardiologist (physician) and Advanced Practice Providers (APPs -  Physician Assistants and Nurse Practitioners) who all work together to provide you with the care you need, when you need it.  We recommend signing up for the patient portal called "MyChart".  Sign up information is provided on this After Visit Summary.  MyChart is used to connect with patients for Virtual Visits (Telemedicine).  Patients are able to view lab/test results, encounter notes, upcoming appointments, etc.  Non-urgent messages can be sent to your provider as well.   To learn more about what you can do with MyChart, go to [email or web address].    Your next appointment:   12 month(s)  The format for your next appointment:   In Person  Provider:   Quay Burow, MD

## 2022-02-25 NOTE — Assessment & Plan Note (Signed)
Thoracic aortic aneurysm measuring 4.3 cm in the ascending aorta at 4.7 at the root which has remained stable.  This is followed by Dr. Kipp Brood.  We will repeat it chest CTA in May of next year.

## 2022-02-25 NOTE — Progress Notes (Signed)
02/25/2022 Shanon Ace   31-Dec-1955  [id number]  Primary Physician Sherrilee Gilles, DO Primary Cardiologist: Lorretta Harp MD [name], [place], [place]  HPI:  Marvin Harvey is a 66 y.o.  mild to moderately overweight married Caucasian male father of 49, grandfather of 2 grandchildren who is a Engineering geologist and  who I last saw in the office 10/18/2020.  He was being seen for his first post hospital outpatient follow-up after being admitted 01/27/2018 in transfer from [facility] for chest pain.  I done a cardiac catheterization on him 01/26/2018 revealing normal coronary arteries.  He was seen by cardiologist there and is had his aortic valve followed by echo periodically.  He recently had a CTA that showed a 5.2 cm aortic aneurysm.  His father apparently also had a thoracic aortic aneurysm and had this repaired along with an AVR in his 55s.  He has no other cardiac risk factors.   Since I saw saw him a year and a half ago he continues to do well.  He is followed by Dr. Kipp Brood for a small ascending thoracic aortic aneurysm measuring 4.7 cm at the root and 4.3 cm in the ascending aorta.  He denies chest pain or shortness of breath.  He does have obstructive sleep apnea on CPAP and wishes to explore the inspire device.   Current Meds  Medication Sig   allopurinol (ZYLOPRIM) 300 MG tablet Take 450 mg by mouth every evening.    aspirin EC 81 MG tablet Take 81 mg by mouth daily.   fexofenadine (ALLEGRA) 60 MG tablet Take 60 mg by mouth as needed for allergies or rhinitis.   levocetirizine (XYZAL) 5 MG tablet Take 5 mg by mouth every evening.   Multiple Vitamins-Minerals (MULTIVITAMIN WITH MINERALS) tablet Take 1 tablet by mouth daily.   OVER THE COUNTER MEDICATION Nasocort nasal spray, as needed for allergies.   [DISCONTINUED] OVER THE COUNTER MEDICATION Vitamin B 12, 1000 mcg one tablet daily.   [DISCONTINUED] OVER THE COUNTER MEDICATION Vitamin D 3 one capsule daily.   Current  Facility-Administered Medications for the 02/25/22 encounter (Office Visit) with Lorretta Harp, MD  Medication   0.9 %  sodium chloride infusion     No Known Allergies  Social History   Socioeconomic History   Marital status: Married    Spouse name: Not on file   Number of children: Not on file   Years of education: Not on file   Highest education level: Not on file  Occupational History   Not on file  Tobacco Use   Smoking status: Never   Smokeless tobacco: Never  Vaping Use   Vaping Use: Never used  Substance and Sexual Activity   Alcohol use: Never   Drug use: Never   Sexual activity: Not on file  Other Topics Concern   Not on file  Social History Narrative   Not on file   Social Determinants of Health   Financial Resource Strain: Not on file  Food Insecurity: Not on file  Transportation Needs: Not on file  Physical Activity: Not on file  Stress: Not on file  Social Connections: Not on file  Intimate Partner Violence: Not on file     Review of Systems: General: negative for chills, fever, night sweats or weight changes.  Cardiovascular: negative for chest pain, dyspnea on exertion, edema, orthopnea, palpitations, paroxysmal nocturnal dyspnea or shortness of breath Dermatological: negative for rash Respiratory: negative for cough or wheezing Urologic: negative  for hematuria Abdominal: negative for nausea, vomiting, diarrhea, bright red blood per rectum, melena, or hematemesis Neurologic: negative for visual changes, syncope, or dizziness All other systems reviewed and are otherwise negative except as noted above.    Blood pressure 128/70, pulse (!) 50, height '5\' 11"'$  (1.803 m), weight 217 lb (98.4 kg).  General appearance: alert and no distress Neck: no adenopathy, no carotid bruit, no JVD, supple, symmetrical, trachea midline, and thyroid not enlarged, symmetric, no tenderness/mass/nodules Lungs: clear to auscultation bilaterally Heart: regular rate and  rhythm, S1, S2 normal, no murmur, click, rub or gallop Extremities: extremities normal, atraumatic, no cyanosis or edema Pulses: 2+ and symmetric Skin: Skin color, texture, turgor normal. No rashes or lesions Neurologic: Grossly normal  EKG sinus bradycardia at 50 without ST or T wave changes.  Personally reviewed this EKG.  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  This is followed by Dr. Kipp Brood.  We will repeat it chest CTA in May of next year.     Lorretta Harp MD FACP,FACC,FAHA, [facility] 02/25/2022 10:56 AM

## 2022-06-03 ENCOUNTER — Ambulatory Visit: Payer: Medicare Other | Attending: Cardiology | Admitting: Cardiology

## 2022-06-03 ENCOUNTER — Telehealth: Payer: Self-pay | Admitting: *Deleted

## 2022-06-03 ENCOUNTER — Encounter: Payer: Self-pay | Admitting: Cardiology

## 2022-06-03 VITALS — BP 114/66 | HR 67 | Ht 71.0 in | Wt 218.2 lb

## 2022-06-03 DIAGNOSIS — G4733 Obstructive sleep apnea (adult) (pediatric): Secondary | ICD-10-CM | POA: Insufficient documentation

## 2022-06-03 NOTE — Addendum Note (Signed)
Addended by: Molli Barrows on: 06/03/2022 11:07 AM   Modules accepted: Orders

## 2022-06-03 NOTE — Patient Instructions (Addendum)
Medication Instructions:  Your physician recommends that you continue on your current medications as directed. Please refer to the Current Medication list given to you today.  *If you need a refill on your cardiac medications before your next appointment, please call your pharmacy*  Lab Work: NONE  Testing/Procedures: Your physician has requested that you have an itamar home sleep study performed.  Follow-Up: As needed   Important Information About Sugar

## 2022-06-03 NOTE — Progress Notes (Signed)
Sleep Medicine CONSULT Note    Date:  06/03/2022   ID:  Marvin Harvey, DOB 1956-05-17, MRN [id number]  PCP:  Sherrilee Gilles, DO  Cardiologist: Quay Burow, MD   Chief Complaint  Patient presents with   New Patient (Initial Visit)    OSA    History of Present Illness:  Marvin Harvey is a 67 y.o. male who is being seen today for the evaluation of obstructive sleep apnea at the request of Quay Burow, MD.  This is a 67 year old male with a history of obstructive sleep apnea who has been on CPAP but when he last saw Dr. Gwenlyn Found He had mentioned that he was interested in talking to someone about the inspire device.  He tells me that he was dx with OSA about 12 years ago through [facility] sleep center and was started on CPAP.  He tells me that his OSA was severe and he was started on CPAP therapy.  He says that he does not rest well with his CPAP therapy.  He is using a new mask but he has to sleep on his back because any movement breaks the mask seal and then he has to wake up and adjust it so he never gets adequate sleep.  Before CPAP he would wake up with tightness in his chest and would have to get up and sit on the side of the bed.  He also had severe HAs as well.  He was tired during the day and would fall asleep reading.  After his sleep study he says they called him to get on O2 immediately at night until he could get on CPAP.  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   4.7 centimeters at sinuses of Valsalva, 4.2 cm mid ascending   Arthritis    Cataract    BILATERAL   GERD (gastroesophageal reflux disease)    History of repair of hiatal hernia 2001   Sleep apnea    On CPAP    Past Surgical History:  Procedure Laterality Date   CLOSED REDUCTION HAND FRACTURE Right    COLONOSCOPY     LEFT HEART CATH AND CORS/GRAFTS ANGIOGRAPHY N/A 01/26/2018    Procedure: LEFT HEART CATH AND CORS/GRAFTS ANGIOGRAPHY;  Surgeon: Lorretta Harp, MD;  Location:  [facility];  Service: Cardiovascular;  Laterality: N/A;   NISSEN FUNDOPLICATION  1937   Complete in [place], [place]   POLYPECTOMY      Current Medications: Current Meds  Medication Sig   allopurinol (ZYLOPRIM) 300 MG tablet Take 450 mg by mouth every evening.    aspirin EC 81 MG tablet Take 81 mg by mouth daily.   fexofenadine (ALLEGRA) 60 MG tablet Take 60 mg by mouth as needed for allergies or rhinitis.   levocetirizine (XYZAL) 5 MG tablet Take 5 mg by mouth every evening.   Multiple Vitamins-Minerals (MULTIVITAMIN WITH MINERALS) tablet Take 1 tablet by mouth daily.   OVER THE COUNTER MEDICATION Nasocort nasal spray, as needed for allergies.    Allergies:   Patient has no known allergies.   Social History   Socioeconomic History   Marital status: Married    Spouse name: Not on file   Number of children: Not on file   Years of education: Not on file   Highest education level: Not on file  Occupational History   Not on file  Tobacco Use   Smoking status: Never   Smokeless tobacco: Never  Vaping Use   Vaping Use: Never used  Substance and Sexual Activity   Alcohol use: Never   Drug use: Never   Sexual activity: Not on file  Other Topics Concern   Not on file  Social History Narrative   Not on file   Social Determinants of Health   Financial Resource Strain: Not on file  Food Insecurity: Not on file  Transportation Needs: Not on file  Physical Activity: Not on file  Stress: Not on file  Social Connections: Not on file     Family History:  The patient's family history includes Aortic aneurysm in his father; Atrial fibrillation in his mother; Colon polyps in his father; Coronary artery disease in his father; Transient ischemic attack in his mother.   ROS:   Please see the history of present illness.    ROS All other systems reviewed and are negative.       No data to display             PHYSICAL EXAM:   VS:  BP 114/66   Pulse 67   Ht '5\' 11"'$  (1.803 m)   Wt 218 lb 3.2 oz (99 kg)   SpO2 96%   BMI 30.43 kg/m    GEN: Well nourished, well developed, in no acute distress  HEENT: normal  Neck: no JVD, carotid bruits, or masses Cardiac: RRR; no murmurs, rubs, or gallops,no edema.  Intact distal pulses bilaterally.  Respiratory:  clear to auscultation bilaterally, normal work of breathing GI: soft, nontender, nondistended, + BS MS: no deformity or atrophy  Skin: warm and dry, no rash Neuro:  Alert and Oriented x 3, Strength and sensation are intact Psych: euthymic mood, full affect  Wt Readings from Last 3 Encounters:  06/03/22 218 lb 3.2 oz (99 kg)  02/25/22 217 lb (98.4 kg)  09/23/21 217 lb (98.4 kg)      Studies/Labs Reviewed:   none  Recent Labs: No results found for requested labs within last 365 days.    Additional studies/ records that were reviewed today include:  OV notes from Dr. Gwenlyn Found    ASSESSMENT:    1. OSA (obstructive sleep apnea)      PLAN:  In order of problems listed above:  OSA  -he tells me that he had severe OSA and nocturnal hypoxemia and was on O2 at one point but is now using CPAP at 14cm H2O  -he does not rest well with the CPAP and has to adjust the mask frequently at night -I think he would be a good candidate for the Inspire device but will need an updated sleep study -I will set up an Itamar HST to document AHI and if >15/hr will refer to Dr. Redmond Baseman with [facility] for evaluation for Inspire device  Time Spent: 20 minutes total time of encounter, including 15 minutes spent in face-to-face patient care on the date of this encounter. This time includes coordination of care and counseling regarding above mentioned problem list. Remainder of non-face-to-face time involved reviewing chart documents/testing relevant to the patient encounter and documentation in the medical record. I have  independently reviewed documentation from referring provider  Medication Adjustments/Labs and Tests Ordered: Current medicines are reviewed at length with the patient today.  Concerns regarding medicines are outlined above.  Medication changes, Labs and Tests ordered today are listed in the Patient Instructions  below.  There are no Patient Instructions on file for this visit.   Signed, Fransico Him, MD  06/03/2022 10:39 AM    [address], [place], [place]  [address] Phone: [phone]; Fax: [phone]

## 2022-06-03 NOTE — Telephone Encounter (Signed)
Itamar ordered by Dr. Radford Pax.  Patient was given all instructions and watched educational video.  He knows he will receive a call from our office once approved.

## 2022-06-09 NOTE — Telephone Encounter (Signed)
Prior Authorization for [facility] sent to MEDICARE via web portal. Tracking Number .  NO PA REQ

## 2022-06-11 NOTE — Telephone Encounter (Signed)
Placed call to pt and he has been given his code, 1234, and aware that he may complete the Itamar.  Pt did say, he had funerals and all coming up, and it would be latter part of next week before he can complete it.

## 2022-06-18 ENCOUNTER — Encounter (INDEPENDENT_AMBULATORY_CARE_PROVIDER_SITE_OTHER): Payer: Medicare Other | Admitting: Cardiology

## 2022-06-18 DIAGNOSIS — G4733 Obstructive sleep apnea (adult) (pediatric): Secondary | ICD-10-CM

## 2022-06-19 ENCOUNTER — Ambulatory Visit: Payer: Medicare Other | Attending: Cardiology

## 2022-06-19 DIAGNOSIS — G4733 Obstructive sleep apnea (adult) (pediatric): Secondary | ICD-10-CM

## 2022-06-19 NOTE — Procedures (Signed)
SLEEP STUDY REPORT Patient Information Study Date: 06/18/2022 Patient Name: Marvin Harvey Patient ID: [id number] Birth Date: 12/06/1955 Age: 67 Gender: Male BMI: 30.6 (W=218 lb, H=5' 11'') Stopbang: 4 Referring Physician: Fransico Him, MD  TEST DESCRIPTION: Home sleep apnea testing was completed using the WatchPat, a Type 1 device, utilizing peripheral arterial tonometry (PAT), chest movement, actigraphy, pulse oximetry, pulse rate, body position and snore. AHI was calculated with apnea and hypopnea using valid sleep time as the denominator. RDI includes apneas, hypopneas, and RERAs. The data acquired and the scoring of sleep and all associated events were performed in accordance with the recommended standards and specifications as outlined in the AASM Manual for the Scoring of Sleep and Associated Events 2.2.0 (2015).   FINDINGS:   1. Mild Obstructive Sleep Apnea with AHI 7.7/hr.   2. No Central Sleep Apnea with pAHIc 1.8/hr.   3. Oxygen desaturations as low as 87%.   4. Mild to moderate snoring was present. O2 sats were < 88% for 0.1 min.   5. Total sleep time was 7 hrs and 10 min.   6. 13.5% of total sleep time was spent in REM sleep.   7. Normal sleep onset latency at 19 min.   8. Prolonged REM sleep onset latency at 160 min.   9. Total awakenings were 6.  10. Arrhythmia detection: None.  DIAGNOSIS: Mild Obstructive Sleep Apnea (G47.33)  RECOMMENDATIONS:   1.  Clinical correlation of these findings is necessary.  The decision to treat obstructive sleep apnea (OSA) is usually based on the presence of apnea symptoms or the presence of associated medical conditions such as Hypertension, Congestive Heart Failure, Atrial Fibrillation or Obesity.  The most common symptoms of OSA are snoring, gasping for breath while sleeping, daytime sleepiness and fatigue.   2.  Initiating apnea therapy is recommended given the presence of symptoms and/or associated conditions. Recommend  proceeding with one of the following:     a.  Auto-CPAP therapy with a pressure range of 5-20cm H2O.     b.  An oral appliance (OA) that can be obtained from certain dentists with expertise in sleep medicine.  These are primarily of use in non-obese patients with mild and moderate disease.     c.  An ENT consultation which may be useful to look for specific causes of obstruction and possible treatment options.     d.  If patient is intolerant to PAP therapy, consider referral to ENT for evaluation for hypoglossal nerve stimulator.   3.  Close follow-up is necessary to ensure success with CPAP or oral appliance therapy for maximum benefit.  4.  A follow-up oximetry study on CPAP is recommended to assess the adequacy of therapy and determine the need for supplemental oxygen or the potential need for Bi-level therapy.  An arterial blood gas to determine the adequacy of baseline ventilation and oxygenation should also be considered.  5.  Healthy sleep recommendations include:  adequate nightly sleep (normal 7-9 hrs/night), avoidance of caffeine after noon and alcohol near bedtime, and maintaining a sleep environment that is cool, dark and quiet.  6.  Weight loss for overweight patients is recommended.  Even modest amounts of weight loss can significantly improve the severity of sleep apnea.  7.  Snoring recommendations include:  weight loss where appropriate, side sleeping, and avoidance of alcohol before bed.  8.  Operation of motor vehicle should be avoided when sleepy.  Signature:   Fransico Him, MD; [facility];  Diplomat, Tax adviser of Sleep Medicine Electronically Signed: 06/19/2022

## 2022-06-30 ENCOUNTER — Telehealth: Payer: Self-pay | Admitting: *Deleted

## 2022-06-30 NOTE — Telephone Encounter (Signed)
The patient has been notified of the result. Left detailed message on voicemail and informed patient to call back..Meshulem [place], CMA   

## 2022-06-30 NOTE — Telephone Encounter (Signed)
-----   Message from Lauralee Evener, [place] sent at 06/19/2022  9:40 AM EST -----  ----- Message ----- From: Sueanne Margarita, MD Sent: 06/19/2022   8:23 AM EST To: Cv Div Sleep Studies  Please let patient know that he has only mild OSA and therefore will not qualify for Inspire.  I recommend getting an in lab PSG to see if we have more documented apneas when scoring in the lab

## 2022-07-01 NOTE — Telephone Encounter (Signed)
Return call: Patient states he activated the test at 11:30 which was about 30 minutes before he went to sleep. He awakened about 1 am with chest pounding because he stopped breathing. Was awake for three hours he couldn't get back to sleep. Then finally went and it was time to get up. He did not have his cpap on while testing. He does not want to revisit the lab at this time due to other health obligations and test. Patient understands his order is good for 1 year.

## 2022-09-02 ENCOUNTER — Other Ambulatory Visit: Payer: Self-pay | Admitting: Thoracic Surgery (Cardiothoracic Vascular Surgery)

## 2022-09-02 DIAGNOSIS — I7121 Aneurysm of the ascending aorta, without rupture: Secondary | ICD-10-CM

## 2022-09-28 LAB — LAB REPORT - SCANNED: EGFR: 93

## 2022-10-07 ENCOUNTER — Telehealth: Payer: Self-pay | Admitting: Cardiovascular Disease

## 2022-10-07 NOTE — Telephone Encounter (Signed)
Paulene Floor at [facility] called with information on patient.  Patient was admitted to hospital on 5/6 with CVA and discharge on 5/7.  He has not decided if he wants to schedule with [place] or there cardiology office.  She has reached out to him to let him know their cardiology will reach out to him but ask [place] to notify Dr Allyson Sabal as well of his admission and notes. Have called patient as well to see if he would schedule. No answer so LM to please call our office regarding if he will schedule here or with their cardiologist.

## 2022-10-07 NOTE — Telephone Encounter (Signed)
Paulene Floor, a nurse at [facility] called and said that the patient was admitted on 5/6 and discharged on 5/7 for acute CVA

## 2022-10-07 NOTE — Telephone Encounter (Signed)
Patient called back and has quite a bit of confusion.  Advised he needs to be seen with either cardiology asap.  He is finally scheduled for net Friday with J Cleaver.  Repeated the appointment date, time and location twice and he repeated and wrote it down.

## 2022-10-07 NOTE — Telephone Encounter (Signed)
Patient is not sure who he needs to schedule with, he said he was told by there office that he would have to see a specialist. So he is not sure on what to do. He would like a call back.

## 2022-10-13 ENCOUNTER — Encounter: Payer: Self-pay | Admitting: Thoracic Surgery (Cardiothoracic Vascular Surgery)

## 2022-10-13 ENCOUNTER — Ambulatory Visit (INDEPENDENT_AMBULATORY_CARE_PROVIDER_SITE_OTHER): Payer: Medicare Other | Admitting: Thoracic Surgery (Cardiothoracic Vascular Surgery)

## 2022-10-13 ENCOUNTER — Ambulatory Visit
Admission: RE | Admit: 2022-10-13 | Discharge: 2022-10-13 | Disposition: A | Discharge status: Home / Self Care | Payer: Medicare Other | Source: Ambulatory Visit | Attending: Thoracic Surgery (Cardiothoracic Vascular Surgery) | Admitting: Thoracic Surgery (Cardiothoracic Vascular Surgery)

## 2022-10-13 VITALS — BP 147/81 | HR 60 | Resp 18 | Ht 71.0 in | Wt 219.0 lb

## 2022-10-13 DIAGNOSIS — I7121 Aneurysm of the ascending aorta, without rupture: Secondary | ICD-10-CM | POA: Diagnosis not present

## 2022-10-13 MED ORDER — IOPAMIDOL (ISOVUE-370) INJECTION 76%
75.0000 mL | Freq: Once | INTRAVENOUS | Status: AC | PRN
  Administered 2022-10-13: 75 mL via INTRAVENOUS

## 2022-10-13 NOTE — Progress Notes (Signed)
[address]       [name] [address]             [phone]     HPI: [name] returns for follow-up of his ascending aneurysm.  Treyon Harvey is a 67 year old man with a history of ascending aortic aneurysm, gastroesophageal reflux, hiatal hernia, Nissen fundoplication, sleep apnea, arthritis, and a recent stroke.  Found to have an ascending aneurysm in 2019.  Has been followed since then.  In 2022 he was having a lot of issues with reflux.  He had a slipped Nissen.  He saw Dr. Cliffton Asters but opted not to have redo surgery.  He saw Marvin Harvey in 2023.  His aneurysm was stable.  In the interim since his last visit he had a light stroke about 2 weeks ago.  He was discharged following day.  He says he was told he had a hole in his heart.  He is scheduled to see cardiology this week.  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   1   aspirin EC 81 MG tablet Take 81 mg by mouth daily.     atorvastatin (LIPITOR) 40 MG tablet Take 40 mg by mouth daily.     co-enzyme Q-10 30 MG capsule Take 30 mg by mouth 3 (three) times daily.     fexofenadine (ALLEGRA) 60 MG tablet Take 60 mg by mouth as needed for allergies or rhinitis.     levocetirizine (XYZAL) 5 MG tablet Take 5 mg by mouth every evening.     LEXAPRO 5 MG tablet Take by mouth.     Multiple Vitamins-Minerals (MULTIVITAMIN WITH MINERALS) tablet Take 1 tablet by mouth daily.     OVER THE COUNTER MEDICATION Nasocort nasal spray, as needed for allergies.     PLAVIX 75 MG tablet Take by mouth.     No current  facility-administered medications for this visit.    Physical Exam BP (!) 147/81 (BP Location: Right Arm, Patient Position: Sitting)   Pulse 60   Resp 18   Ht 5\' 11"  (1.803 m)   Wt 219 lb (99.3 kg)   SpO2 96% Comment: RA  BMI 30.72 kg/m  67 year old man in no acute distress Alert and oriented x 3 with no focal deficits Lungs clear bilaterally Cardiac regular rate and rhythm no murmur  Diagnostic Tests: CT ANGIOGRAPHY CHEST WITH CONTRAST   TECHNIQUE: Multidetector CT imaging of the chest was performed using the standard protocol during bolus administration of intravenous contrast. Multiplanar CT image reconstructions and MIPs were obtained to evaluate the vascular anatomy.   RADIATION DOSE REDUCTION: This exam was performed according to the departmental dose-optimization program which includes automated exposure control, adjustment of the mA and/or kV according to patient size and/or use of iterative reconstruction technique.   CONTRAST:  75mL ISOVUE-370 IOPAMIDOL (ISOVUE-370) INJECTION 76%   COMPARISON:  09/23/2021 and additional prior studies dating back to 2020.   FINDINGS: Cardiovascular: Measurement of the aortic root  is somewhat impaired by pulsation artifact but is estimated to be approximately 4.0-4.1 cm. The ascending thoracic aorta demonstrates stable aneurysmal disease of approximately 4.2 cm. The proximal arch measures 3.3 cm and the distal arch 3.4 cm. The descending thoracic aorta measures 2.8 cm. No evidence of aortic dissection. Visualized proximal great vessels demonstrate normal patency and stable normal variant anatomy with separate origin of the left vertebral artery off of the aortic arch.   The heart size is normal. No significant calcified coronary artery plaque identified. No visualized pericardial fluid. Central pulmonary arteries are normal in caliber.   Mediastinum/Nodes: No enlarged mediastinal, hilar, or axillary lymph nodes. Thyroid  gland, trachea, and esophagus demonstrate no significant findings. Stable moderate to large hiatal hernia.   Lungs/Pleura: There is no evidence of pulmonary edema, consolidation, pneumothorax, nodule or pleural fluid.   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 Largest measures 6 cm at the upper pole of the right kidney. These require no separate follow-up.   Musculoskeletal: No chest wall abnormality. No acute or significant osseous findings.   Review of the MIP images confirms the above findings.   IMPRESSION: 1. Stable aneurysmal disease of the ascending thoracic aorta of approximately 4.2 cm. Recommend annual imaging followup by CTA or MRA. This recommendation follows 2010 ACCF/AHA/AATS/ACR/ASA/SCA/SCAI/SIR/STS/SVM Guidelines for the Diagnosis and Management of Patients with Thoracic Aortic Disease. Circulation. 2010; 121: Z610-R604. Aortic aneurysm NOS (ICD10-I71.9) 2. Stable moderate to large hiatal hernia. 3. 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 These require no separate follow-up.   Aortic aneurysm NOS (ICD10-I71.9).     Electronically Signed   By: Marvin Harvey M.D.   On: 10/13/2022 12:25 I personally reviewed the CT images.  Stable 4.2 to 4.3 cm ascending aneurysm.  Unchanged hiatal hernia.  Impression: Marvin Harvey is a 67 year old man with a history of ascending aortic aneurysm, gastroesophageal reflux, hiatal hernia, Nissen fundoplication, sleep apnea, arthritis, and a recent stroke, likely due to a PFO.  Ascending aortic aneurysm-stable at 4.2 cm.  He understands importance of blood pressure control.  He was instructed to let his blood pressure run high immediately after his recent stroke.  I advised him that keep it below 150 and the importance of long-term blood pressure control.  Stroke/PFO-has an appointment with cardiology on Friday.   Plan: Follow-up as scheduled  with cardiology on Friday Monitor blood pressure at home and if greater than 150 systolic will need to initiate treatment. Return in 1 year with CT angio of chest  [name], MD [facility] [phone]

## 2022-10-13 NOTE — H&P (View-Only) (Signed)
  Cardiology Clinic Note   Patient Name: Marvin Harvey Date of Encounter: 10/13/2022  Primary Care Provider:  McGee, Rachel, DO Primary Cardiologist:  Jonathan Berry, MD  Patient Profile    Marvin Harvey 66-year-old male presents to the clinic today for posthospital follow-up.  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   4.7 centimeters at sinuses of Valsalva, 4.2 cm mid ascending   Arthritis    Cataract    BILATERAL   GERD (gastroesophageal reflux disease)    History of repair of hiatal hernia 2001   Sleep apnea    On CPAP   Past Surgical History:  Procedure Laterality Date   CLOSED REDUCTION HAND FRACTURE Right    COLONOSCOPY     LEFT HEART CATH AND CORS/GRAFTS ANGIOGRAPHY N/A 01/26/2018   Procedure: LEFT HEART CATH AND CORS/GRAFTS ANGIOGRAPHY;  Surgeon: Berry, Jonathan J, MD;  Location: [facility];  Service: Cardiovascular;  Laterality: N/A;   NISSEN FUNDOPLICATION  2004   Complete in [place], [place]   POLYPECTOMY      Allergies  No Known Allergies  History of Present Illness    Marvin Harvey has a PMH of-both aortic root aneurysm, GERD, chest pain, gout, and CVA.  He underwent cardiac catheterization 01/26/2018 which showed normal coronary arteries.  After that he followed up with cardiology for echocardiograms to monitor his aortic valve.  His CTA showed a 5.2 cm aortic aneurysm.  He reported that his father had thoracic aortic aneurysm which was repaired in his 70s.  He was seen by Dr. Lightfoot for ascending thoracic aortic aneurysm measuring 4.7 cm at root and 4.3 cm in the ascending aorta.  He denied chest pain and shortness of breath. He was seen and evaluated by cardiology 5/22.  He was seen in follow-up by Dr. Berry on 02/25/2022.  At that time he reported he wished to be evaluated for inspire device.  He was admitted to the hospital 09/28/2022 and discharged on 09/29/2022.   He was diagnosed with acute CVA.  He was instructed to follow-up with neurology in 3 to 4 weeks.  He was placed on aspirin and Plavix.  He was instructed to continue Plavix for 21 days and then continue aspirin thereafter.  He was started on a atorvastatin and Lexapro.  He contacted the nurse triage line and requested cardiology appointment.  He reported that he had been told he had a hole in his heart.  From diagnostic testing done at [facility] I see no evidence of echocardiogram.  Lower extremity ultrasound showed no DVT.  Chest x-ray was negative.  CT brain showed no large vessel occlusion, no target or acute endovascular stroke intervention.  Multilevel degenerative changes of the C-spine, and age-indeterminate infarcts of the right cerebellum and left caudate nucleus.  CT angio chest aorta 10/13/2022 showed stable ascending thoracic aortic aneurysm measuring 4.2 cm.  Stable moderate large hiatal hernia.  Recommendation for annual CTA or MRA was planned.  He presents to the clinic today for follow-up evaluation and states he is concerned for PFO after recent echocardiogram during admission for CVA.  We reviewed his recent hospital admission.  I do not have echocardiogram for review at this time.  Case discussed with DOD.  Recommendation for TEE and referral to structural heart if PFO closure is needed was discussed.  His EKG today shows sinus bradycardia with first-degree   AV block.  Blood pressure well-controlled at 118/75.  We will also request results from cardiac event monitor.  CT showed no carotid plaque.  Lower extremity Dopplers negative for DVT.  We reviewed his most recent CT angio chest.  He and his wife expressed understanding.  Will plan follow-up in 3 to 4 months.  Today he denies chest pain, shortness of breath, lower extremity edema, fatigue, palpitations, melena, hematuria, hemoptysis, diaphoresis, weakness, presyncope, syncope, orthopnea, and PND.    Home Medications     Prior to Admission medications   Medication Sig Start Date End Date Taking? Authorizing Provider  allopurinol (ZYLOPRIM) 300 MG tablet Take 450 mg by mouth every evening.  11/01/17   [provider]  aspirin EC 81 MG tablet Take 81 mg by mouth daily.    [provider]  atorvastatin (LIPITOR) 40 MG tablet Take 40 mg by mouth daily.    [provider]  co-enzyme Q-10 30 MG capsule Take 30 mg by mouth 3 (three) times daily.    [provider]  fexofenadine (ALLEGRA) 60 MG tablet Take 60 mg by mouth as needed for allergies or rhinitis.    [provider]  levocetirizine (XYZAL) 5 MG tablet Take 5 mg by mouth every evening.    [provider]  LEXAPRO 5 MG tablet Take by mouth. 09/29/22   [provider]  Multiple Vitamins-Minerals (MULTIVITAMIN WITH MINERALS) tablet Take 1 tablet by mouth daily.    [provider]  OVER THE COUNTER MEDICATION Nasocort nasal spray, as needed for allergies.    [provider]  PLAVIX 75 MG tablet Take by mouth. 09/29/22   [provider]    Family History    Family History  Problem Relation Age of Onset   Atrial fibrillation Mother    Transient ischemic attack Mother    Colon polyps Father    Coronary artery disease Father        s/p CABGx3, Valve replacement   Aortic aneurysm Father        repaired   Colon cancer Neg Hx    Esophageal cancer Neg Hx    Rectal cancer Neg Hx    Stomach cancer Neg Hx    Liver disease Neg Hx    He indicated that the status of his mother is unknown. He indicated that the status of his father is unknown. He indicated that the status of his neg hx is unknown.  Social History    Social History   Socioeconomic History   Marital status: Married    Spouse name: Not on file   Number of children: Not on file   Years of education: Not on file   Highest education level: Not on file  Occupational History   Not on file  Tobacco Use    Smoking status: Never   Smokeless tobacco: Never  Vaping Use   Vaping Use: Never used  Substance and Sexual Activity   Alcohol use: Never   Drug use: Never   Sexual activity: Not on file  Other Topics Concern   Not on file  Social History Narrative   Not on file   Social Determinants of Health   Financial Resource Strain: Not on file  Food Insecurity: Not on file  Transportation Needs: Not on file  Physical Activity: Not on file  Stress: Not on file  Social Connections: Not on file  Intimate Partner Violence: Not on file     Review of Systems      General:  No chills, fever, night sweats or weight changes.  Cardiovascular:  No chest pain, dyspnea on exertion, edema, orthopnea, palpitations, paroxysmal nocturnal dyspnea. Dermatological: No rash, lesions/masses Respiratory: No cough, dyspnea Urologic: No hematuria, dysuria Abdominal:   No nausea, vomiting, diarrhea, bright red blood per rectum, melena, or hematemesis Neurologic:  No visual changes, wkns, changes in mental status. All other systems reviewed and are otherwise negative except as noted above.  Physical Exam    VS:  There were no vitals taken for this visit. , BMI There is no height or weight on file to calculate BMI. GEN: Well nourished, well developed, in no acute distress. HEENT: normal. Neck: Supple, no JVD, carotid bruits, or masses. Cardiac: RRR, no murmurs, rubs, or gallops. No clubbing, cyanosis, edema.  Radials/DP/PT 2+ and equal bilaterally.  Respiratory:  Respirations regular and unlabored, clear to auscultation bilaterally. GI: Soft, nontender, nondistended, BS + x 4. MS: no deformity or atrophy. Skin: warm and dry, no rash. Neuro:  Strength and sensation are intact. Psych: Normal affect.  Accessory Clinical Findings    Recent Labs: No results found for requested labs within last 365 days.   Recent Lipid Panel No results found for: "CHOL", "TRIG", "HDL", "CHOLHDL", "VLDL", "LDLCALC",  "LDLDIRECT"       ECG personally reviewed by me today-sinus bradycardia with first-degree AV block PR interval 220 ms- No acute changes   Echocardiogram 08/31/2017 Study Conclusions   - Left ventricle: The cavity size was normal. Wall thickness was    normal. Systolic function was vigorous. The estimated ejection    fraction was in the range of 65% to 70%. Wall motion was normal;    there were no regional wall motion abnormalities. Features are    consistent with a pseudonormal left ventricular filling pattern,    with concomitant abnormal relaxation and increased filling    pressure (grade 2 diastolic dysfunction).  - Aortic valve: There was mild regurgitation. Valve area (VTI):    2.84 cm^2. Valve area (Vmax): 2.89 cm^2. Valve area (Vmean): 2.96    cm^2.   -------------------------------------------------------------------  Study data:  No prior study was available for comparison.  Study  status:  Routine.  Procedure:  The patient reported no pain pre or  post test. Transthoracic echocardiography. Image quality was  adequate.  Study completion:  There were no complications.  Echocardiography.  M-mode, complete 2D, spectral Doppler, and color  Doppler.  Birthdate:  Patient birthdate: 10/21/1955.  Age:  Patient  is 67 yr old.  Sex:  Gender: male.    BMI: 29 kg/m^2.  Blood  pressure:     117/79  Patient status:  Inpatient.  Study date:  Study date: 01/26/2018. Study time: 08:33 AM.  Location:  Bedside.    -------------------------------------------------------------------   -------------------------------------------------------------------  Left ventricle:  The cavity size was normal. Wall thickness was  normal. Systolic function was vigorous. The estimated ejection  fraction was in the range of 65% to 70%. Wall motion was normal;  there were no regional wall motion abnormalities. Features are  consistent with a pseudonormal left ventricular filling pattern,  with concomitant  abnormal relaxation and increased filling pressure  (grade 2 diastolic dysfunction).   -------------------------------------------------------------------  Aortic valve:   The valve appears to be grossly normal.    Doppler:    There was no stenosis.   There was mild regurgitation.    VTI  ratio of LVOT to aortic valve: 0.9. Valve area (VTI): 2.84 cm^2.  Indexed valve area (VTI):   1.3 cm^2/m^2. Peak velocity ratio of LVOT  to aortic valve: 0.92. Valve area (Vmax): 2.89 cm^2. Indexed valve  area (Vmax): 1.32 cm^2/m^2. Mean velocity ratio of LVOT to aortic  valve: 0.94. Valve area (Vmean): 2.96 cm^2. Indexed valve area  (Vmean): 1.35 cm^2/m^2.    Mean gradient (S): 3 mm Hg. Peak  gradient (S): 5 mm Hg.   -------------------------------------------------------------------  Aorta: Ascending aorta: The ascending aorta was mildly dilated.   -------------------------------------------------------------------  Mitral valve:   The valve appears to be grossly normal.    Doppler:   There was no significant regurgitation.    Valve area by pressure  half-time: 3.06 cm^2. Indexed valve area by pressure half-time: 1.4  cm^2/m^2.    Peak gradient (D): 2 mm Hg.   -------------------------------------------------------------------  Left atrium:  The atrium was normal in size.   -------------------------------------------------------------------  Right ventricle:  The cavity size was normal. Systolic function was  normal.   -------------------------------------------------------------------  Pulmonic valve:    The valve appears to be grossly normal.  Doppler:  There was no significant regurgitation.   -------------------------------------------------------------------  Tricuspid valve:   The valve appears to be grossly normal.  Doppler:  There was trivial regurgitation.   -------------------------------------------------------------------  Pulmonary artery:   Systolic pressure was within the normal  range.    -------------------------------------------------------------------  Pericardium: There was no pericardial effusion.   -------------------------------------------------------------------  Systemic veins:  Inferior vena cava: The vessel was normal in size. The  respirophasic diameter changes were in the normal range (>= 50%),  consistent with normal central venous pressure.     Assessment & Plan   1.  Aortic aneurysm-denies recent episodes of back and chest discomfort.  CT angio chest 10/13/2022 showed thoracic aortic aneurysm measuring 4.2 cm and aortic root noted to be 4.0-4.1 cm. Maintain good blood pressure control Low-sodium diet Plan for repeat CT angio chest aorta 5/25  CVA-neurologically intact.  Admitted 5/6 - 09/29/2022 with acute CVA at [facility].  Reviewed recent admission and he expressed understanding.  Wore cardiac event monitor and return monitor on Tuesday.  CT did not show carotid plaque.  Request echocardiogram with bubble study. Continue aspirin, atorvastatin, clopidogrel Order TEE for eval of PFO and plan for referral to structural heart if PFO identified   Disposition: Follow-up with Dr.Berry or me in 3-4 months.   Marvin Harvey Moder NP-C     10/13/2022, 1:09 PM [facility] [address] Office [phone] Fax [phone]    I spent 15 minutes examining this patient, reviewing medications, and using patient centered shared decision making involving her cardiac care.  Prior to her visit I spent greater than 20 minutes reviewing her past medical history,  medications, and prior cardiac tests.  

## 2022-10-13 NOTE — Progress Notes (Signed)
Cardiology Clinic Note   Patient Name: Marvin Harvey Date of Encounter: 10/13/2022  Primary Care Provider:  Jonathon Bellows, DO Primary Cardiologist:  Nanetta Batty, MD  Patient Profile    [name] 67 year old male presents to the clinic today for posthospital follow-up.  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   4.7 centimeters at sinuses of Valsalva, 4.2 cm mid ascending   Arthritis    Cataract    BILATERAL   GERD (gastroesophageal reflux disease)    History of repair of hiatal hernia 2001   Sleep apnea    On CPAP   Past Surgical History:  Procedure Laterality Date   CLOSED REDUCTION HAND FRACTURE Right    COLONOSCOPY     LEFT HEART CATH AND CORS/GRAFTS ANGIOGRAPHY N/A 01/26/2018   Procedure: LEFT HEART CATH AND CORS/GRAFTS ANGIOGRAPHY;  Surgeon: Runell Gess, MD;  Location: [facility];  Service: Cardiovascular;  Laterality: N/A;   NISSEN FUNDOPLICATION  2004   Complete in [place], [place]   POLYPECTOMY      Allergies  No Known Allergies  History of Present Illness    [name] has a PMH of-both aortic root aneurysm, GERD, chest pain, gout, and CVA.  He underwent cardiac catheterization 01/26/2018 which showed normal coronary arteries.  After that he followed up with cardiology for echocardiograms to monitor his aortic valve.  His CTA showed a 5.2 cm aortic aneurysm.  He reported that his father had thoracic aortic aneurysm which was repaired in his 53s.  He was seen by Dr. 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  He denied chest pain and shortness of breath. He was seen and evaluated by cardiology 5/22.  He was seen in follow-up by Dr. Allyson Sabal on 02/25/2022.  At that time he reported he wished to be evaluated for inspire device.  He was admitted to the hospital 09/28/2022 and discharged on 09/29/2022.   He was diagnosed with acute CVA.  He was instructed to follow-up with neurology in 3 to 4 weeks.  He was placed on aspirin and Plavix.  He was instructed to continue Plavix for 21 days and then continue aspirin thereafter.  He was started on a atorvastatin and Lexapro.  He contacted the nurse triage line and requested cardiology appointment.  He reported that he had been told he had a hole in his heart.  From diagnostic testing done at [facility] I see no evidence of echocardiogram.  Lower extremity ultrasound showed no DVT.  Chest x-ray was negative.  CT brain showed no large vessel occlusion, no target or acute endovascular stroke intervention.  Multilevel degenerative changes of the C-spine, and age-indeterminate infarcts of the right cerebellum and left caudate nucleus.  CT angio chest aorta 10/13/2022 showed stable ascending thoracic aortic aneurysm measuring 4.2 cm.  Stable moderate large hiatal hernia.  Recommendation for annual CTA or MRA was planned.  He presents to the clinic today for follow-up evaluation and states he is concerned for PFO after recent echocardiogram during admission for CVA.  We reviewed his recent hospital admission.  I do not have echocardiogram for review at this time.  Case discussed with DOD.  Recommendation for TEE and referral to structural heart if PFO closure is needed was discussed.  His EKG today shows sinus bradycardia with first-degree  AV block.  Blood pressure well-controlled at 118/75.  We will also request results from cardiac event monitor.  CT showed no carotid plaque.  Lower extremity Dopplers negative for DVT.  We reviewed his most recent CT angio chest.  He and his wife expressed understanding.  Will plan follow-up in 3 to 4 months.  Today he denies chest pain, shortness of breath, lower extremity edema, fatigue, palpitations, melena, hematuria, hemoptysis, diaphoresis, weakness, presyncope, syncope, orthopnea, and PND.    Home Medications     Prior to Admission medications   Medication Sig Start Date End Date Taking? Authorizing Provider  allopurinol (ZYLOPRIM) 300 MG tablet Take 450 mg by mouth every evening.  11/01/17   [provider]  aspirin EC 81 MG tablet Take 81 mg by mouth daily.    [provider]  atorvastatin (LIPITOR) 40 MG tablet Take 40 mg by mouth daily.    [provider]  co-enzyme Q-10 30 MG capsule Take 30 mg by mouth 3 (three) times daily.    [provider]  fexofenadine (ALLEGRA) 60 MG tablet Take 60 mg by mouth as needed for allergies or rhinitis.    [provider]  levocetirizine (XYZAL) 5 MG tablet Take 5 mg by mouth every evening.    [provider]  LEXAPRO 5 MG tablet Take by mouth. 09/29/22   [provider]  Multiple Vitamins-Minerals (MULTIVITAMIN WITH MINERALS) tablet Take 1 tablet by mouth daily.    [provider]  OVER THE COUNTER MEDICATION Nasocort nasal spray, as needed for allergies.    [provider]  PLAVIX 75 MG tablet Take by mouth. 09/29/22   [provider]    Family History    Family History  Problem Relation Age of Onset   Atrial fibrillation Mother    Transient ischemic attack Mother    Colon polyps Father    Coronary artery disease Father        s/p CABGx3, Valve replacement   Aortic aneurysm Father        repaired   Colon cancer Neg Hx    Esophageal cancer Neg Hx    Rectal cancer Neg Hx    Stomach cancer Neg Hx    Liver disease Neg Hx    He indicated that the status of his mother is unknown. He indicated that the status of his father is unknown. He indicated that the status of his neg hx is unknown.  Social History    Social History   Socioeconomic History   Marital status: Married    Spouse name: Not on file   Number of children: Not on file   Years of education: Not on file   Highest education level: Not on file  Occupational History   Not on file  Tobacco Use    Smoking status: Never   Smokeless tobacco: Never  Vaping Use   Vaping Use: Never used  Substance and Sexual Activity   Alcohol use: Never   Drug use: Never   Sexual activity: Not on file  Other Topics Concern   Not on file  Social History Narrative   Not on file   Social Determinants of Health   Financial Resource Strain: Not on file  Food Insecurity: Not on file  Transportation Needs: Not on file  Physical Activity: Not on file  Stress: Not on file  Social Connections: Not on file  Intimate Partner Violence: Not on file     Review of Systems  General:  No chills, fever, night sweats or weight changes.  Cardiovascular:  No chest pain, dyspnea on exertion, edema, orthopnea, palpitations, paroxysmal nocturnal dyspnea. Dermatological: No rash, lesions/masses Respiratory: No cough, dyspnea Urologic: No hematuria, dysuria Abdominal:   No nausea, vomiting, diarrhea, bright red blood per rectum, melena, or hematemesis Neurologic:  No visual changes, wkns, changes in mental status. All other systems reviewed and are otherwise negative except as noted above.  Physical Exam    VS:  There were no vitals taken for this visit. , BMI There is no height or weight on file to calculate BMI. GEN: Well nourished, well developed, in no acute distress. HEENT: normal. Neck: Supple, no JVD, carotid bruits, or masses. Cardiac: RRR, no murmurs, rubs, or gallops. No clubbing, cyanosis, edema.  Radials/DP/PT 2+ and equal bilaterally.  Respiratory:  Respirations regular and unlabored, clear to auscultation bilaterally. GI: Soft, nontender, nondistended, BS + x 4. MS: no deformity or atrophy. Skin: warm and dry, no rash. Neuro:  Strength and sensation are intact. Psych: Normal affect.  Accessory Clinical Findings    Recent Labs: No results found for requested labs within last 365 days.   Recent Lipid Panel No results found for: "CHOL", "TRIG", "HDL", "CHOLHDL", "VLDL", "LDLCALC",  "LDLDIRECT"       ECG personally reviewed by me today-sinus bradycardia with first-degree AV block PR interval 220 ms- No acute changes   Echocardiogram 08/31/2017 Study Conclusions   - Left ventricle: The cavity size was normal. Wall thickness was    normal. Systolic function was vigorous. The estimated ejection    fraction was in the range of 65% to 70%. Wall motion was normal;    there were no regional wall motion abnormalities. Features are    consistent with a pseudonormal left ventricular filling pattern,    with concomitant abnormal relaxation and increased filling    pressure (grade 2 diastolic dysfunction).  - Aortic valve: There was mild regurgitation. Valve area (VTI):    2.84 cm^2. Valve area (Vmax): 2.89 cm^2. Valve area (Vmean): 2.96    cm^2.   -------------------------------------------------------------------  Study data:  No prior study was available for comparison.  Study  status:  Routine.  Procedure:  The patient reported no pain pre or  post test. Transthoracic echocardiography. Image quality was  adequate.  Study completion:  There were no complications.  Echocardiography.  M-mode, complete 2D, spectral Doppler, and color  Doppler.  Birthdate:  Patient birthdate: [birth year].  Age:  Patient  is 67 yr old.  Sex:  Gender: male.    BMI: 29 kg/m^2.  Blood  pressure:     117/79  Patient status:  Inpatient.  Study date:  Study date: 01/26/2018. Study time: 08:33 AM.  Location:  Bedside.    -------------------------------------------------------------------   -------------------------------------------------------------------  Left ventricle:  The cavity size was normal. Wall thickness was  normal. Systolic function was vigorous. The estimated ejection  fraction was in the range of 65% to 70%. Wall motion was normal;  there were no regional wall motion abnormalities. Features are  consistent with a pseudonormal left ventricular filling pattern,  with concomitant  abnormal relaxation and increased filling pressure  (grade 2 diastolic dysfunction).   -------------------------------------------------------------------  Aortic valve:   The valve appears to be grossly normal.    Doppler:    There was no stenosis.   There was mild regurgitation.    VTI  ratio of LVOT to aortic valve: 0.9. Valve area (VTI): 2.84 cm^2.  Indexed valve area (VTI):  1.3 cm^2/m^2. Peak velocity ratio of LVOT  to aortic valve: 0.92. Valve area (Vmax): 2.89 cm^2. Indexed valve  area (Vmax): 1.32 cm^2/m^2. Mean velocity ratio of LVOT to aortic  valve: 0.94. Valve area (Vmean): 2.96 cm^2. Indexed valve area  (Vmean): 1.35 cm^2/m^2.    Mean gradient (S): 3 mm Hg. Peak  gradient (S): 5 mm Hg.   -------------------------------------------------------------------  Aorta: Ascending aorta: The ascending aorta was mildly dilated.   -------------------------------------------------------------------  Mitral valve:   The valve appears to be grossly normal.    Doppler:   There was no significant regurgitation.    Valve area by pressure  half-time: 3.06 cm^2. Indexed valve area by pressure half-time: 1.4  cm^2/m^2.    Peak gradient (D): 2 mm Hg.   -------------------------------------------------------------------  Left atrium:  The atrium was normal in size.   -------------------------------------------------------------------  Right ventricle:  The cavity size was normal. Systolic function was  normal.   -------------------------------------------------------------------  Pulmonic valve:    The valve appears to be grossly normal.  Doppler:  There was no significant regurgitation.   -------------------------------------------------------------------  Tricuspid valve:   The valve appears to be grossly normal.  Doppler:  There was trivial regurgitation.   -------------------------------------------------------------------  Pulmonary artery:   Systolic pressure was within the normal  range.    -------------------------------------------------------------------  Pericardium: There was no pericardial effusion.   -------------------------------------------------------------------  Systemic veins:  Inferior vena cava: The vessel was normal in size. The  respirophasic diameter changes were in the normal range (>= 50%),  consistent with normal central venous pressure.     Assessment & Plan   1.  Aortic aneurysm-denies recent episodes of back and chest discomfort.  CT angio chest 10/13/2022 showed thoracic aortic aneurysm measuring 4.2 cm and aortic root noted to be 4.0-4.1 cm. Maintain good blood pressure control Low-sodium diet Plan for repeat CT angio chest aorta 5/25  CVA-neurologically intact.  Admitted 5/6 - 09/29/2022 with acute CVA at [facility].  Reviewed recent admission and he expressed understanding.  Wore cardiac event monitor and return monitor on Tuesday.  CT did not show carotid plaque.  Request echocardiogram with bubble study. Continue aspirin, atorvastatin, clopidogrel Order TEE for eval of PFO and plan for referral to structural heart if PFO identified   Disposition: Follow-up with Dr.Berry or me in 3-4 months.   Thomasene Ripple. Ivyrose Hashman NP-C     10/13/2022, 1:09 PM [facility] [address] Office [phone] Fax [phone]    I spent 15 minutes examining this patient, reviewing medications, and using patient centered shared decision making involving her cardiac care.  Prior to her visit I spent greater than 20 minutes reviewing her past medical history,  medications, and prior cardiac tests.

## 2022-10-16 ENCOUNTER — Ambulatory Visit: Payer: Medicare Other | Attending: General Practice | Admitting: General Practice

## 2022-10-16 ENCOUNTER — Encounter: Payer: Self-pay | Admitting: General Practice

## 2022-10-16 VITALS — BP 118/78 | HR 59 | Ht 71.0 in | Wt 221.0 lb

## 2022-10-16 DIAGNOSIS — I7121 Aneurysm of the ascending aorta, without rupture: Secondary | ICD-10-CM | POA: Insufficient documentation

## 2022-10-16 DIAGNOSIS — Z8673 Personal history of transient ischemic attack (TIA), and cerebral infarction without residual deficits: Secondary | ICD-10-CM | POA: Diagnosis present

## 2022-10-16 NOTE — Patient Instructions (Addendum)
Medication Instructions:   No changes  *If you need a refill on your cardiac medications before your next appointment, please call your pharmacy*   Lab Work: BMP CBC  If you have labs (blood work) drawn today and your tests are completely normal, you will receive your results only by: MyChart Message (if you have MyChart) OR A paper copy in the mail If you have any lab test that is abnormal or we need to change your treatment, we will call you to review the results.   Testing/Procedures: In 12 months will schedule CT  chest Aorta   Schedule at [address]  - Go to Entrance A Your physician has requested that you have a TEE. During a TEE, sound waves are used to create images of your heart. It provides your doctor with information about the size and shape of your heart and how well your heart's chambers and valves are working. In this test, a transducer is attached to the end of a flexible tube that's guided down your throat and into your esophagus (the tube leading from you mouth to your stomach) to get a more detailed image of your heart. You are not awake for the procedure. Please see the instruction sheet given to you today. For further information please visit [email or web address]     Follow-Up: At [facility], you and your health needs are our priority.  As part of our continuing mission to provide you with exceptional heart care, we have created designated Provider Care Teams.  These Care Teams include your primary Cardiologist (physician) and Advanced Practice Providers (APPs -  Physician Assistants and Nurse Practitioners) who all work together to provide you with the care you need, when you need it.     Your next appointment:   3 to 4 month(s)  The format for your next appointment:   In Person  Provider:   Nanetta Batty, MD  or Edd Fabian, FNP       Other Instruction      Dear Marvin Harvey  You are scheduled for a TEE (Transesophageal  Echocardiogram) on Monday, June 3 with Dr. Flossie Buffy.  Please arrive at the [facility] (Main Entrance A) at [facility]: [address] [place], [place] [address] at 12:00 PM (This time is 1 hour(s) before your procedure to ensure your preparation). Free valet parking service is available. You will check in at ADMITTING. The support person will be asked to wait in the waiting room.  It is OK to have someone drop you off and come back when you are ready to be discharged.      DIET:  Nothing to eat or drink after midnight except a sip of water with medications (see medication instructions below)  MEDICATION INSTRUCTIONS: !!IF ANY NEW MEDICATIONS ARE STARTED AFTER TODAY, PLEASE NOTIFY YOUR PROVIDER AS SOON AS POSSIBLE!!  FYI: Medications such as Semaglutide (Ozempic, [place]), Tirzepatide (Mounjaro, Zepbound), Dulaglutide (Trulicity), etc ("GLP1 agonists") must be held around the time of a procedure. Talk to your provider if you take one of these.     LABS: [email or web address]   FYI:  For your safety, and to allow [place] to monitor your vital signs accurately during the surgery/procedure we request: If you have artificial nails, gel coating, SNS etc, please have those removed prior to your surgery/procedure. Not having the nail coverings /polish removed may result in cancellation or delay of your surgery/procedure.  You must have a responsible person to drive you home and stay  in the waiting area during your procedure. Failure to do so could result in cancellation.  Bring your insurance cards.  *Special Note: Every effort is made to have your procedure done on time. Occasionally there are emergencies that occur at the hospital that may cause delays. Please be patient if a delay does occur.

## 2022-10-17 LAB — BASIC METABOLIC PANEL
BUN/Creatinine Ratio: 14 (ref 10–24)
BUN: 13 mg/dL (ref 8–27)
CO2: 25 mmol/L (ref 20–29)
Calcium: 9.3 mg/dL (ref 8.6–10.2)
Chloride: 105 mmol/L (ref 96–106)
Creatinine, Ser: 0.95 mg/dL (ref 0.76–1.27)
Glucose: 92 mg/dL (ref 70–99)
Potassium: 4.4 mmol/L (ref 3.5–5.2)
Sodium: 142 mmol/L (ref 134–144)
eGFR: 88 mL/min/{1.73_m2} (ref >59)

## 2022-10-17 LAB — CBC
Hematocrit: 45.2 % (ref 37.5–51.0)
Hemoglobin: 15.1 g/dL (ref 13.0–17.7)
MCH: 29 pg (ref 26.6–33.0)
MCHC: 33.4 g/dL (ref 31.5–35.7)
MCV: 87 fL (ref 79–97)
Platelets: 148 10*3/uL — ABNORMAL LOW (ref 150–450)
RBC: 5.2 x10E6/uL (ref 4.14–5.80)
RDW: 13.2 % (ref 11.6–15.4)
WBC: 7.1 10*3/uL (ref 3.4–10.8)

## 2022-10-21 ENCOUNTER — Other Ambulatory Visit: Payer: Self-pay | Admitting: *Deleted

## 2022-10-21 DIAGNOSIS — Z8673 Personal history of transient ischemic attack (TIA), and cerebral infarction without residual deficits: Secondary | ICD-10-CM

## 2022-10-21 DIAGNOSIS — R931 Abnormal findings on diagnostic imaging of heart and coronary circulation: Secondary | ICD-10-CM

## 2022-10-21 NOTE — Progress Notes (Signed)
Order placed for TEE on 10/26/22

## 2022-10-22 ENCOUNTER — Encounter: Payer: Self-pay | Admitting: Neurology

## 2022-10-23 NOTE — Progress Notes (Signed)
Spoke with  patient, Procedure scheduled for 1300, Please arrive at the hospital at 1200, NPO after midnight on Sunday, May take meds with sips of water in the AM, please have transportation for home post procedure, and someone to stay with pt for approximately 24 hours after  Questions answered about "time frame" of stay at hospital

## 2022-10-26 ENCOUNTER — Other Ambulatory Visit: Payer: Self-pay

## 2022-10-26 ENCOUNTER — Ambulatory Visit (HOSPITAL_COMMUNITY)
Admission: RE | Admit: 2022-10-26 | Discharge: 2022-10-26 | Disposition: A | Discharge status: Home / Self Care | Payer: Medicare Other | Attending: Internal Medicine | Admitting: Internal Medicine

## 2022-10-26 ENCOUNTER — Ambulatory Visit (HOSPITAL_BASED_OUTPATIENT_CLINIC_OR_DEPARTMENT_OTHER): Payer: Medicare Other | Admitting: Anesthesiology

## 2022-10-26 ENCOUNTER — Ambulatory Visit (HOSPITAL_COMMUNITY): Payer: Medicare Other | Admitting: Anesthesiology

## 2022-10-26 ENCOUNTER — Encounter (HOSPITAL_COMMUNITY)
Admission: RE | Disposition: A | Discharge status: Home / Self Care | Payer: Self-pay | Source: Home / Self Care | Attending: Internal Medicine

## 2022-10-26 ENCOUNTER — Encounter (HOSPITAL_COMMUNITY): Payer: Self-pay | Admitting: Internal Medicine

## 2022-10-26 ENCOUNTER — Ambulatory Visit (HOSPITAL_BASED_OUTPATIENT_CLINIC_OR_DEPARTMENT_OTHER)
Admission: RE | Admit: 2022-10-26 | Discharge: 2022-10-26 | Disposition: A | Discharge status: Home / Self Care | Payer: Medicare Other | Source: Ambulatory Visit | Attending: General Practice | Admitting: General Practice

## 2022-10-26 DIAGNOSIS — R931 Abnormal findings on diagnostic imaging of heart and coronary circulation: Secondary | ICD-10-CM

## 2022-10-26 DIAGNOSIS — I77819 Aortic ectasia, unspecified site: Secondary | ICD-10-CM | POA: Insufficient documentation

## 2022-10-26 DIAGNOSIS — I341 Nonrheumatic mitral (valve) prolapse: Secondary | ICD-10-CM | POA: Diagnosis not present

## 2022-10-26 DIAGNOSIS — Q2112 Patent foramen ovale: Secondary | ICD-10-CM | POA: Insufficient documentation

## 2022-10-26 DIAGNOSIS — I080 Rheumatic disorders of both mitral and aortic valves: Secondary | ICD-10-CM | POA: Insufficient documentation

## 2022-10-26 DIAGNOSIS — Z8673 Personal history of transient ischemic attack (TIA), and cerebral infarction without residual deficits: Secondary | ICD-10-CM | POA: Insufficient documentation

## 2022-10-26 DIAGNOSIS — G4730 Sleep apnea, unspecified: Secondary | ICD-10-CM | POA: Diagnosis not present

## 2022-10-26 DIAGNOSIS — M1990 Unspecified osteoarthritis, unspecified site: Secondary | ICD-10-CM | POA: Diagnosis not present

## 2022-10-26 DIAGNOSIS — Z8249 Family history of ischemic heart disease and other diseases of the circulatory system: Secondary | ICD-10-CM | POA: Insufficient documentation

## 2022-10-26 DIAGNOSIS — I351 Nonrheumatic aortic (valve) insufficiency: Secondary | ICD-10-CM

## 2022-10-26 DIAGNOSIS — I340 Nonrheumatic mitral (valve) insufficiency: Secondary | ICD-10-CM | POA: Diagnosis not present

## 2022-10-26 HISTORY — PX: TEE WITHOUT CARDIOVERSION: SHX5443

## 2022-10-26 LAB — ECHO TEE

## 2022-10-26 SURGERY — ECHOCARDIOGRAM, TRANSESOPHAGEAL
Anesthesia: Monitor Anesthesia Care

## 2022-10-26 MED ORDER — LIDOCAINE 2% (20 MG/ML) 5 ML SYRINGE
INTRAMUSCULAR | Status: DC | PRN
  Administered 2022-10-26: 60 mg via INTRAVENOUS

## 2022-10-26 MED ORDER — PROPOFOL 500 MG/50ML IV EMUL
INTRAVENOUS | Status: DC | PRN
  Administered 2022-10-26 (×2): 25 mg via INTRAVENOUS
  Administered 2022-10-26: 125 ug/kg/min via INTRAVENOUS
  Administered 2022-10-26: 25 mg via INTRAVENOUS

## 2022-10-26 MED ORDER — SODIUM CHLORIDE 0.9 % IV SOLN: INTRAVENOUS | Status: DC

## 2022-10-26 NOTE — Anesthesia Postprocedure Evaluation (Signed)
Anesthesia Post Note  Patient: Marvin Harvey  Procedure(s) Performed: TRANSESOPHAGEAL ECHOCARDIOGRAM     Patient location during evaluation: PACU Anesthesia Type: MAC Level of consciousness: awake and alert Pain management: pain level controlled Vital Signs Assessment: post-procedure vital signs reviewed and stable Respiratory status: spontaneous breathing, nonlabored ventilation and respiratory function stable Cardiovascular status: blood pressure returned to baseline Postop Assessment: no apparent nausea or vomiting Anesthetic complications: no   No notable events documented.  Last Vitals:  Vitals:   10/26/22 1410 10/26/22 1415  BP: (!) 158/81   Pulse: (!) 47 (!) 46  Resp: 17 16  Temp:    SpO2: 97% 98%    Last Pain:  Vitals:   10/26/22 1400  TempSrc:   PainSc: 0-No pain                 Shanda Howells

## 2022-10-26 NOTE — Discharge Instructions (Signed)

## 2022-10-26 NOTE — Transfer of Care (Signed)
Immediate Anesthesia Transfer of Care Note  Patient: Marvin Harvey  Procedure(s) Performed: TRANSESOPHAGEAL ECHOCARDIOGRAM  Patient Location: PACU and Cath Lab  Anesthesia Type:MAC  Level of Consciousness: awake and patient cooperative  Airway & Oxygen Therapy: Patient Spontanous Breathing and Patient connected to nasal cannula oxygen  Post-op Assessment: Report given to RN and Post -op Vital signs reviewed and stable  Post vital signs: Reviewed and stable  Last Vitals:  Vitals Value Taken Time  BP    Temp    Pulse    Resp    SpO2      Last Pain:  Vitals:   10/26/22 1230  TempSrc: Temporal  PainSc: 0-No pain         Complications: No notable events documented.

## 2022-10-26 NOTE — CV Procedure (Signed)
    TRANSESOPHAGEAL ECHOCARDIOGRAM   NAME:  Marvin Harvey    MRN: [id number] DOB:  1956/02/18    ADMIT DATE: 10/26/2022  INDICATIONS: Positive Bubble study  PROCEDURE:   Informed consent was obtained prior to the procedure. The risks, benefits and alternatives for the procedure were discussed and the patient comprehended these risks.  Risks include, but are not limited to, cough, sore throat, vomiting, nausea, somnolence, esophageal and stomach trauma or perforation, bleeding, low blood pressure, aspiration, pneumonia, infection, trauma to the teeth and death.    Procedural time out performed. The oropharynx was anesthetized with topical 1% benzocaine.    Anesthesia was administered by Dr. Stephannie Peters and team.  The patient was administered a total of Propofol 300 mg to achieve and maintain moderate to deep conscious sedation.  The patient's heart rate, blood pressure, and oxygen saturation are monitored continuously during the procedure. The period of conscious sedation is 22 minutes, of which I was present face-to-face 100% of this time.   The transesophageal probe was inserted in the esophagus and stomach without difficulty and multiple views were obtained.   COMPLICATIONS:    There were no immediate complications.  KEY FINDINGS:  Patent foramen ovale with lipomatous interatrial septum. Mitral valve prolapse without significant mitral regurgitation.  Full report to follow. Further management per primary team.   Riley Lam, MD [place]  [facility]  1:44 PM

## 2022-10-26 NOTE — Anesthesia Preprocedure Evaluation (Addendum)
Anesthesia Evaluation  Patient identified by MRN, date of birth, ID band Patient awake    Reviewed: Allergy & Precautions, NPO status , Patient's Chart, lab work & pertinent test results  History of Anesthesia Complications Negative for: history of anesthetic complications  Airway Mallampati: II  TM Distance: >3 FB Neck ROM: Full    Dental no notable dental hx.    Pulmonary sleep apnea    Pulmonary exam normal        Cardiovascular Normal cardiovascular exam  ?PFO   Neuro/Psych CVA (09/28/22)    GI/Hepatic Neg liver ROS,GERD  Controlled,,  Endo/Other  negative endocrine ROS    Renal/GU negative Renal ROS     Musculoskeletal  (+) Arthritis ,    Abdominal   Peds  Hematology Plt 148k   Anesthesia Other Findings Day of surgery medications reviewed with patient.  Reproductive/Obstetrics                              Anesthesia Physical Anesthesia Plan  ASA: 4  Anesthesia Plan: MAC   Post-op Pain Management: Minimal or no pain anticipated   Induction:   PONV Risk Score and Plan: Treatment may vary due to age or medical condition and Propofol infusion  Airway Management Planned: Natural Airway and Nasal Cannula  Additional Equipment: None  Intra-op Plan:   Post-operative Plan:   Informed Consent: I have reviewed the patients History and Physical, chart, labs and discussed the procedure including the risks, benefits and alternatives for the proposed anesthesia with the patient or authorized representative who has indicated his/her understanding and acceptance.       Plan Discussed with: CRNA  Anesthesia Plan Comments:         Anesthesia Quick Evaluation

## 2022-10-26 NOTE — Interval H&P Note (Signed)
History and Physical Interval Note:  10/26/2022 1:09 PM  Marvin Harvey  has presented today for surgery, with the diagnosis of RULE OUT PSO.  The various methods of treatment have been discussed with the patient and family. After consideration of risks, benefits and other options for treatment, the patient has consented to  Procedure(s): TRANSESOPHAGEAL ECHOCARDIOGRAM (N/A) as a surgical intervention.  The patient's history has been reviewed, patient examined, no change in status, stable for surgery.  I have reviewed the patient's chart and labs.  Questions were answered to the patient's satisfaction.     Florentine Diekman A Kierre Deines

## 2022-10-27 ENCOUNTER — Encounter (HOSPITAL_COMMUNITY): Payer: Self-pay | Admitting: Internal Medicine

## 2022-10-27 ENCOUNTER — Other Ambulatory Visit: Payer: Self-pay

## 2022-10-27 DIAGNOSIS — Q2112 Patent foramen ovale: Secondary | ICD-10-CM

## 2022-10-28 ENCOUNTER — Telehealth: Payer: Self-pay

## 2022-10-28 NOTE — Telephone Encounter (Signed)
Per Edd Fabian, called to arrange PFO consult.   Left message to call back.

## 2022-10-29 NOTE — Progress Notes (Unsigned)
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  LDL goal less than 70 Hgb M8U goal less than 7 Scheduled for PFO closure As stroke does seem embolic, and based on his age, I would still consider a fib as well.  He endorses occasional palpitations.  I will reach out to his cardiologist, Dr. Allyson Sabal, to see if another 2 week heart monitor would be warranted. Mediterranean diet Follow up 6 months.      Subjective:  Marvin Harvey is a 67 year old right-handed male with axending aortic aneurysm, sleep apnea and GERD s/p Nissen fundoplication, hiatal hernia who presents for stroke.  History supplemented by his accompanying wife, hospital records and referring provider's note.  On 5/6, he developed slurred speech and left hand/finger weakness.  He was admitted to [facility] in [place], [place].  To evaluate for radiculopathy, he had MR of C-spine which revealed cervical spondylosis moderate bilateral neural foraminal narrowing at C5-6 and minimal left neural foraminal narrowing at C6-7 but no central stenosis.  CT head showed age-indeterminate infarcts in the right cerebellum and left caudate nucleus but no acute intracranial abnormalities.  However, follow up MRI of brain revealed tiny acute or subacute cortical infarct at the posterior cortex of the right precentral gyrus/motor strip near the hand motor knob as well as the previously seen chronic infarcts in the right cerebellum.  CTA of head ane neck showed no LVO or hemodynamically significant stenosis.  2D echo revealed a PFO.  Lower extremity doppler was negative for DVT.  LDL was 85.   Hgb A1c 5.3.  He was discharged on DAP for 21 days followed by ASA 81mg  daily thereafter.  Followed up with outpatient cardiology.  TEE on 10/26/2022 revealed EF 55-60% with again noted PFO.  Scheduled to see Dr. Excell Seltzer to evaluate for PFO closure.  He had a 2 week Zio patch monitor in [place].  Doesn't know results.  He does report occasional palpitations.    Strength in hand normal but has slight sensory changes.       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   4.7 centimeters at sinuses of Valsalva, 4.2 cm mid ascending   Arthritis    Cataract    BILATERAL   GERD (gastroesophageal reflux disease)    History of repair of hiatal hernia 2001   Sleep apnea    On CPAP    PAST SURGICAL HISTORY: Past Surgical History:  Procedure Laterality Date   CLOSED REDUCTION HAND FRACTURE Right    COLONOSCOPY     LEFT HEART CATH AND CORS/GRAFTS ANGIOGRAPHY N/A 01/26/2018   Procedure: LEFT HEART CATH AND CORS/GRAFTS ANGIOGRAPHY;  Surgeon: Runell Gess, MD;  Location: [facility]IVE [facility];  Service: Cardiovascular;  Laterality: N/A;   NISSEN FUNDOPLICATION  2004   Complete in [facility], [place]   POLYPECTOMY     TEE WITHOUT CARDIOVERSION N/A 10/26/2022   Procedure: TRANSESOPHAGEAL ECHOCARDIOGRAM;  Surgeon: Christell Constant, MD;  Location: [facility] INVASIVE CV LAB;  Service: Cardiovascular;  Laterality: N/A;    MEDICATIONS: Current Outpatient Medications on File Prior to  Visit  Medication Sig Dispense Refill   allopurinol (ZYLOPRIM) 300 MG tablet Take 450 mg by mouth every evening.   1   aspirin EC 81 MG tablet Take 81 mg by mouth daily.     atorvastatin (LIPITOR) 40 MG tablet Take 40 mg by mouth at bedtime.     Coenzyme Q10 100 MG TABS Take 100 mg by mouth in the morning.     levocetirizine (XYZAL) 5 MG tablet Take 5 mg by mouth daily as needed for allergies.     LEXAPRO 10 MG tablet Take 10 mg by mouth daily.      PLAVIX 75 MG tablet Take by mouth. (Patient not taking: Reported on 10/21/2022)     triamcinolone (NASACORT ALLERGY 24HR) 55 MCG/ACT AERO nasal inhaler Place 1 spray into the nose in the morning.     No current facility-administered medications on file prior to visit.    ALLERGIES: No Known Allergies  FAMILY HISTORY: Family History  Problem Relation Age of Onset   Atrial fibrillation Mother    Transient ischemic attack Mother    Colon polyps Father    Coronary artery disease Father        s/p CABGx3, Valve replacement   Aortic aneurysm Father        repaired   Colon cancer Neg Hx    Esophageal cancer Neg Hx    Rectal cancer Neg Hx    Stomach cancer Neg Hx    Liver disease Neg Hx     Objective:  Blood pressure 133/69, pulse 61, height 5\' 11"  (1.803 m), weight 214 lb 6.4 oz (97.3 kg), SpO2 91 %. General: No acute distress.  Patient appears well-groomed.   Head:  Normocephalic/atraumatic Eyes:  fundi examined but not visualized Neck: supple, no paraspinal tenderness, full range of motion Back: No paraspinal tenderness Heart: regular rate and rhythm Lungs: Clear to auscultation bilaterally. Vascular: No carotid bruits. Neurological Exam: Mental status: alert and oriented to person, place, and time, speech fluent and not dysarthric, language intact. Cranial nerves: CN I: not tested CN II: pupils equal, round and reactive to light, visual fields intact CN III, IV, VI:  full range of motion, no nystagmus, no ptosis CN V: facial sensation intact. CN VII: upper and lower face symmetric CN VIII: hearing intact CN IX, X: gag intact, uvula midline CN XI: sternocleidomastoid and trapezius muscles intact CN XII: tongue midline Bulk & Tone: normal, no fasciculations. Motor:  muscle strength 5/5 throughout Sensation:  Pinprick, temperature and vibratory sensation intact. Deep Tendon Reflexes:  2+ throughout,  toes downgoing.   Finger to nose testing:  Without dysmetria.   Heel  to shin:  Without dysmetria.   Gait:  Normal station and stride.  Romberg negative.    Thank you for allowing me to take part in the care of this patient.  Shon Millet, DO  CC: Jonathon Bellows, DO

## 2022-10-29 NOTE — Telephone Encounter (Signed)
Scheduled the patient for PFO closure consult with Dr. Excell Seltzer 11/12/2022. He was grateful for call and agreed with plan.

## 2022-11-02 ENCOUNTER — Ambulatory Visit (INDEPENDENT_AMBULATORY_CARE_PROVIDER_SITE_OTHER): Payer: Medicare Other | Admitting: Neurology

## 2022-11-02 ENCOUNTER — Encounter: Payer: Self-pay | Admitting: Neurology

## 2022-11-02 VITALS — BP 133/69 | HR 61 | Ht 71.0 in | Wt 214.4 lb

## 2022-11-02 DIAGNOSIS — I63411 Cerebral infarction due to embolism of right middle cerebral artery: Secondary | ICD-10-CM | POA: Diagnosis not present

## 2022-11-02 DIAGNOSIS — Q2112 Patent foramen ovale: Secondary | ICD-10-CM

## 2022-11-02 NOTE — Patient Instructions (Signed)
Continue aspirin 81mg  daily and atorvastatin Follow up with Dr. Excell Seltzer Follow up with the heart monitor.  I will reach out to Dr. Allyson Sabal to see if another 2 weeks would be beneficial Mediterranean diet (see below) Follow up 6 months    Mediterranean Diet A Mediterranean diet refers to food and lifestyle choices that are based on the traditions of countries located on the [employer]. It focuses on eating more fruits, vegetables, whole grains, beans, nuts, seeds, and heart-healthy fats, and eating less dairy, meat, eggs, and processed foods with added sugar, salt, and fat. This way of eating has been shown to help prevent certain conditions and improve outcomes for people who have chronic diseases, like kidney disease and heart disease. What are tips for following this plan? Reading food labels Check the serving size of packaged foods. For foods such as rice and pasta, the serving size refers to the amount of cooked product, not dry. Check the total fat in packaged foods. Avoid foods that have saturated fat or trans fats. Check the ingredient list for added sugars, such as corn syrup. Shopping  Buy a variety of foods that offer a balanced diet, including: Fresh fruits and vegetables (produce). Grains, beans, nuts, and seeds. Some of these may be available in unpackaged forms or large amounts (in bulk). Fresh seafood. Poultry and eggs. Low-fat dairy products. Buy whole ingredients instead of prepackaged foods. Buy fresh fruits and vegetables in-season from local farmers markets. Buy plain frozen fruits and vegetables. If you do not have access to quality fresh seafood, buy precooked frozen shrimp or canned fish, such as tuna, salmon, or sardines. Stock your pantry so you always have certain foods on hand, such as olive oil, canned tuna, canned tomatoes, rice, pasta, and beans. Cooking Cook foods with extra-virgin olive oil instead of using butter or other vegetable oils. Have meat as  a side dish, and have vegetables or grains as your main dish. This means having meat in small portions or adding small amounts of meat to foods like pasta or stew. Use beans or vegetables instead of meat in common dishes like chili or lasagna. Experiment with different cooking methods. Try roasting, broiling, steaming, and sauting vegetables. Add frozen vegetables to soups, stews, pasta, or rice. Add nuts or seeds for added healthy fats and plant protein at each meal. You can add these to yogurt, salads, or vegetable dishes. Marinate fish or vegetables using olive oil, lemon juice, garlic, and fresh herbs. Meal planning Plan to eat one vegetarian meal one day each week. Try to work up to two vegetarian meals, if possible. Eat seafood two or more times a week. Have healthy snacks readily available, such as: Vegetable sticks with hummus. Greek yogurt. Fruit and nut trail mix. Eat balanced meals throughout the week. This includes: Fruit: 2-3 servings a day. Vegetables: 4-5 servings a day. Low-fat dairy: 2 servings a day. Fish, poultry, or lean meat: 1 serving a day. Beans and legumes: 2 or more servings a week. Nuts and seeds: 1-2 servings a day. Whole grains: 6-8 servings a day. Extra-virgin olive oil: 3-4 servings a day. Limit red meat and sweets to only a few servings a month. Lifestyle  Cook and eat meals together with your family, when possible. Drink enough fluid to keep your urine pale yellow. Be physically active every day. This includes: Aerobic exercise like running or swimming. Leisure activities like gardening, walking, or housework. Get 7-8 hours of sleep each night. If recommended by your health  care provider, drink red wine in moderation. This means 1 glass a day for nonpregnant women and 2 glasses a day for men. A glass of wine equals 5 oz (150 mL). What foods should I eat? Fruits Apples. Apricots. Avocado. Berries. Bananas. Cherries. Dates. Figs. Grapes. Lemons.  Melon. Oranges. Peaches. Plums. Pomegranate. Vegetables Artichokes. Beets. Broccoli. Cabbage. Carrots. Eggplant. Green beans. Chard. Kale. Spinach. Onions. Leeks. Peas. Squash. Tomatoes. Peppers. Radishes. Grains Whole-grain pasta. Brown rice. Bulgur wheat. Polenta. Couscous. Whole-wheat bread. Orpah Cobb. Meats and other proteins Beans. Almonds. Sunflower seeds. Pine nuts. Peanuts. Cod. Salmon. Scallops. Shrimp. Tuna. Tilapia. Clams. Oysters. Eggs. Poultry without skin. Dairy Low-fat milk. Cheese. Greek yogurt. Fats and oils Extra-virgin olive oil. Avocado oil. Grapeseed oil. Beverages Water. Red wine. Herbal tea. Sweets and desserts Greek yogurt with honey. Baked apples. Poached pears. Trail mix. Seasonings and condiments Basil. Cilantro. Coriander. Cumin. Mint. Parsley. Sage. Rosemary. Tarragon. Garlic. Oregano. Thyme. Pepper. Balsamic vinegar. Tahini. Hummus. Tomato sauce. Olives. Mushrooms. The items listed above may not be a complete list of foods and beverages you can eat. Contact a dietitian for more information. What foods should I limit? This is a list of foods that should be eaten rarely or only on special occasions. Fruits Fruit canned in syrup. Vegetables Deep-fried potatoes (french fries). Grains Prepackaged pasta or rice dishes. Prepackaged cereal with added sugar. Prepackaged snacks with added sugar. Meats and other proteins Beef. Pork. Lamb. Poultry with skin. Hot dogs. Tomasa Blase. Dairy Ice cream. Sour cream. Whole milk. Fats and oils Butter. Canola oil. Vegetable oil. Beef fat (tallow). Lard. Beverages Juice. Sugar-sweetened soft drinks. Beer. Liquor and spirits. Sweets and desserts Cookies. Cakes. Pies. Candy. Seasonings and condiments Mayonnaise. Pre-made sauces and marinades. The items listed above may not be a complete list of foods and beverages you should limit. Contact a dietitian for more information. Summary The Mediterranean diet includes both food  and lifestyle choices. Eat a variety of fresh fruits and vegetables, beans, nuts, seeds, and whole grains. Limit the amount of red meat and sweets that you eat. If recommended by your health care provider, drink red wine in moderation. This means 1 glass a day for nonpregnant women and 2 glasses a day for men. A glass of wine equals 5 oz (150 mL). This information is not intended to replace advice given to you by your health care provider. Make sure you discuss any questions you have with your health care provider. Document Revised: 06/16/2019 Document Reviewed: 04/13/2019 Elsevier Patient Education  2023 [employer].

## 2022-11-04 ENCOUNTER — Encounter: Payer: Self-pay | Admitting: Cardiovascular Disease

## 2022-11-04 ENCOUNTER — Ambulatory Visit: Payer: Medicare Other | Attending: Cardiovascular Disease | Admitting: Cardiovascular Disease

## 2022-11-04 VITALS — BP 116/64 | HR 60 | Ht 71.0 in | Wt 213.2 lb

## 2022-11-04 DIAGNOSIS — I7121 Aneurysm of the ascending aorta, without rupture: Secondary | ICD-10-CM | POA: Diagnosis present

## 2022-11-04 DIAGNOSIS — I639 Cerebral infarction, unspecified: Secondary | ICD-10-CM

## 2022-11-04 NOTE — Assessment & Plan Note (Signed)
Marvin Harvey had a recent CVA in the [place] 09/28/2022.  He was discharged home the following day.  He did wear a 2-week Zio patch but we do not have the results.  He had a transesophageal echo performed by Dr.Chandrasekhar 10/26/2022 that did show a PFO.  He is scheduled to see Dr. Excell Seltzer on 11/12/2022 for consideration of PFO closure.  I did speak to Dr. Everlena Cooper, the patient's neurologist, who feels he would benefit from further rhythm monitoring as well to rule out arrhythmogenic cause.  Because of this, I am referring him to Dr. Royann Shivers for consideration of loop recorder implantation.

## 2022-11-04 NOTE — Patient Instructions (Signed)
Medication Instructions:  Your physician recommends that you continue on your current medications as directed. Please refer to the Current Medication list given to you today.  *If you need a refill on your cardiac medications before your next appointment, please call your pharmacy*    Follow-Up: At [facility], you and your health needs are our priority.  As part of our continuing mission to provide you with exceptional heart care, we have created designated Provider Care Teams.  These Care Teams include your primary Cardiologist (physician) and Advanced Practice Providers (APPs -  Physician Assistants and Nurse Practitioners) who all work together to provide you with the care you need, when you need it.  We recommend signing up for the patient portal called "MyChart".  Sign up information is provided on this After Visit Summary.  MyChart is used to connect with patients for Virtual Visits (Telemedicine).  Patients are able to view lab/test results, encounter notes, upcoming appointments, etc.  Non-urgent messages can be sent to your provider as well.   To learn more about what you can do with MyChart, go to [email or web address].    Your next appointment:   3 month(s)  Provider:   Nanetta Batty, MD     Other Instructions    [facility] at [facility]  [address]  [place], [place] [address]  Phone: [phone] Fax: [phone]   You are scheduled for a Loop Recorder Implant on Thursday, August 1st  at 12:00pm with Dr. Royann Shivers. This will take place at [address] [facility], [address].    Please arrive at your appointment 15-20 minutes early.   You do not need to be fasting.   The procedure is performed with local anesthesia. You will not receive sedatives nor will an IV be placed.   Wash your chest and neck with the surgical soap the evening before and the morning of your procedure. Please following the washing instructions provided.   As with all surgical  implants, there is a small risk of infection. If an infection occurs, the device will be removed. To help reduce the risk, please use the surgical scrub provided. Additional antiseptic precautions will be taken at the time of the procedure.   Please bring your insurance cards.   You will be scheduled for a two week wound check visit with Dr. Royann Shivers. This will be a video visit. If you are unable to do a video visit then you may come into the office.  *Please note that scheduled loop recorder implants may need to be rescheduled if Dr. Royann Shivers has a procedure urgently added on at the hospital   Preparing for the Procedure  Before the procedure, you can play an important role. Because skin is not sterile, your skin needs to be as free of germs as possible. You can reduce the number of germs on your skin by washing with CHG (chlorhexidine gluconate) Soap before the procedure. CHG is an antiseptic cleaner which kills germs and bonds with the skin to continue killing germs even after washing.  Please do not use if you have an allergy to CHG or antibacterial soaps. If your skin becomes reddened/irritated, STOP using the CHG.  DO NOT SHAVE (including legs and underarms) for at least 48 hours prior to first CHG shower. It is OK to shave your face.  Please follow these instructions carefully: Shower the night before the procedure and the morning of with CHG Soap. If you chose to wash your hair, wash your hair first as  usual with your normal shampoo/conditioner. After you shampoo/condition, rinse you hair and body thoroughly to remove shampoo/conditioner. Use CHG as you would any other liquid soap. You can apply CHG directly to the skin and wash gently with a loofah or a clean washcloth. Apply the CHG Soap to your body ONLY FROM THE NECK DOWN. Do not use on open wounds or open sores. Avoid contact with your eyes, ears, mouth, and genitals (private parts).  Wash thoroughly, paying special attention to  the area where your surgery will be performed. Thoroughly rinse your body with warm water from the neck down. DO NOT shower/wash with your normal soap after using and rinsing off the CHG Soap. Pat yourself dry with a clean towel. Wear clean pajamas to bed. Place clean sheets on your bed the night of your first shower and do not sleep with pets..  Day of Surgery: Shower with the CHG Soap following the instructions listed above. DO NOT apply deodorants or lotions.

## 2022-11-04 NOTE — Progress Notes (Signed)
11/04/2022 Marvin Harvey   1955/07/20  [id number]  Primary Physician Marvin Bellows, DO Primary Cardiologist: Marvin Gess MD Marvin Harvey  HPI:  Marvin Harvey is a 67 y.o.  mild to moderately overweight married Caucasian male father of 1, grandfather of 2 grandchildren who is a Clinical research associate and  who I last saw in the office 02/25/2022.  He was being seen for his first post hospital outpatient follow-up after being admitted 01/27/2018 in transfer from [facility] for chest pain.  I done a cardiac catheterization on him 01/26/2018 revealing normal coronary arteries.  He was seen by cardiologist there and is had his aortic valve followed by echo periodically.  He recently had a CTA that showed a 5.2 cm aortic aneurysm.  His father apparently also had a thoracic aortic aneurysm and had this repaired along with an AVR in his 70s.  He has no other cardiac risk factors.   He is followed by Marvin Harvey for a small ascending thoracic aortic aneurysm measuring 4.7 cm at the root and 4.3 cm in the ascending aorta.    He does have obstructive sleep apnea on CPAP and has been seen by Marvin Harvey to explore the inspire device however his sleep study was indeterminant.  Since I saw him 9 months ago he did unfortunately suffer a stroke in [place] 09/28/2022, discharged home the following day.  Workup there was unrevealing including CT of the head, CTA of the neck.  He did wear a 2-week Zio patch however the results are unavailable to me.  He underwent transesophageal echocardiography by Marvin Harvey 10/26/2022 which showed a PFO and scheduled to see Marvin Harvey in the office on 11/12/2022 for gets consideration of PFO closure.   Current Meds  Medication Sig   allopurinol (ZYLOPRIM) 300 MG tablet Take 450 mg by mouth every evening.    aspirin EC 81 MG tablet Take 81 mg by mouth daily.   atorvastatin (LIPITOR) 40 MG tablet Take 40 mg by mouth at bedtime.   Coenzyme Q10 100 MG TABS  Take 100 mg by mouth in the morning.   levocetirizine (XYZAL) 5 MG tablet Take 5 mg by mouth daily as needed for allergies.   LEXAPRO 10 MG tablet Take 10 mg by mouth daily.   triamcinolone (NASACORT ALLERGY 24HR) 55 MCG/ACT AERO nasal inhaler Place 1 spray into the nose as needed.     No Known Allergies  Social History   Socioeconomic History   Marital status: Married    Spouse name: Not on file   Number of children: Not on file   Years of education: Not on file   Highest education level: Not on file  Occupational History   Not on file  Tobacco Use   Smoking status: Never   Smokeless tobacco: Never  Vaping Use   Vaping Use: Never used  Substance and Sexual Activity   Alcohol use: Never   Drug use: Never   Sexual activity: Not on file  Other Topics Concern   Not on file  Social History Narrative   Are you right handed or left handed? Right   Are you currently employed ? 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 Yes   Who lives with you?  Wife   What type of home do you live in: 1 story or 2 story? 2       Social Determinants of Health  Financial Resource Strain: Not on file  Food Insecurity: Not on file  Transportation Needs: Not on file  Physical Activity: Not on file  Stress: Not on file  Social Connections: Not on file  Intimate Partner Violence: Not on file     Review of Systems: General: negative for chills, fever, night sweats or weight changes.  Cardiovascular: negative for chest pain, dyspnea on exertion, edema, orthopnea, palpitations, paroxysmal nocturnal dyspnea or shortness of breath Dermatological: negative for rash Respiratory: negative for cough or wheezing Urologic: negative for hematuria Abdominal: negative for nausea, vomiting, diarrhea, bright red blood per rectum, melena, or hematemesis Neurologic: negative for visual changes, syncope, or dizziness All other systems reviewed and are otherwise negative  except as noted above.    Blood pressure 116/64, pulse 60, height 5\' 11"  (1.803 m), weight 213 lb 3.2 oz (96.7 kg), SpO2 97 %.  General appearance: alert and no distress Neck: no adenopathy, no carotid bruit, no JVD, supple, symmetrical, trachea midline, and thyroid not enlarged, symmetric, no tenderness/mass/nodules Lungs: clear to auscultation bilaterally Heart: regular rate and rhythm, S1, S2 normal, no murmur, click, rub or gallop Extremities: extremities normal, atraumatic, no cyanosis or edema Pulses: 2+ and symmetric Skin: Skin color, texture, turgor normal. No rashes or lesions Neurologic: Grossly normal  EKG not performed today  ASSESSMENT AND PLAN:   Aortic root aneurysm (HCC) History of ascending thoracic aortic aneurysm measuring 4.2 cm by CTA 10/15/2022.  Patient be repeated on annual basis.  CVA (cerebral vascular accident) [facility]) Marvin Harvey had a recent CVA in the [place] 09/28/2022.  He was discharged home the following day.  He did wear a 2-week Zio patch but we do not have the results.  He had a transesophageal echo performed by Marvin Harvey 10/26/2022 that did show a PFO.  He is scheduled to see Marvin Harvey on 11/12/2022 for consideration of PFO closure.  I did speak to Marvin Harvey, the patient's neurologist, who feels he would benefit from further rhythm monitoring as well to rule out arrhythmogenic cause.  Because of this, I am referring him to Marvin Harvey for consideration of loop recorder implantation.     Marvin Gess MD FACP,FACC,FAHA, [facility] 11/04/2022 1:58 PM

## 2022-11-04 NOTE — Assessment & Plan Note (Signed)
History of ascending thoracic aortic aneurysm measuring 4.2 cm by CTA 10/15/2022.  Patient be repeated on annual basis.

## 2022-11-12 ENCOUNTER — Ambulatory Visit: Payer: Medicare Other | Attending: Cardiovascular Disease | Admitting: Cardiovascular Disease

## 2022-11-12 ENCOUNTER — Encounter: Payer: Self-pay | Admitting: Cardiovascular Disease

## 2022-11-12 VITALS — BP 118/68 | HR 57 | Ht 71.0 in | Wt 209.2 lb

## 2022-11-12 DIAGNOSIS — Q2112 Patent foramen ovale: Secondary | ICD-10-CM | POA: Insufficient documentation

## 2022-11-12 NOTE — Progress Notes (Signed)
Cardiology Office Note:    Date:  11/12/2022   ID:  Marvin Harvey, DOB 1956-02-19, MRN [id number]  PCP:  Jonathon Bellows, DO   [facility] Providers Cardiologist:  Nanetta Batty, MD     Referring MD: Jonathon Bellows, DO   Chief Complaint  Patient presents with   PFO Consult    History of Present Illness:    Marvin Harvey is a 67 y.o. male presenting for evaluation of PFO closure, referred by Dr. Everlena Arsen Mangione.  The patient is here with his wife today.  He has a history of a stable aortic root aneurysm followed annually by Dr. Dorris Fetch.  He was in his normal state of health on Sep 28, 2022 when he developed symptoms of expressive aphasia and right hand numbness.  He has had resolution of his symptoms.  He has been seen in follow-up by Dr. Everlena Wayman Hoard with neurology and his symptoms are suspected to have resulted from an embolic event.  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  In-hospital studies included a CT angiogram of the head and neck that showed no large vessel occlusive disease.  An echocardiogram was positive for PFO.  A lower extremity Doppler showed no DVT.  The patient took aspirin and clopidogrel for 21 days and has continued on aspirin.  He did complain of easy bruising on DAPT.  He wore a Zio patch but apparently the monitor was lost when he mailed it in.  He has had no documented atrial fibrillation in the past.  He does have occasional heart palpitations which are very brief in nature.  After hospital discharge, he underwent a transesophageal echo which demonstrated a large PFO.  He presents today for discussion of PFO closure.  The patient has no chest pain or shortness of breath.  He has no other complaints at this time.  Past Medical History:  Diagnosis Date   Allergy    SEASONAL   Aortic root aneurysm (HCC) 01/11/2018   4.7  centimeters at sinuses of Valsalva, 4.2 cm mid ascending   Arthritis    Cataract    BILATERAL   GERD (gastroesophageal reflux disease)    History of repair of hiatal hernia 2001   Sleep apnea    On CPAP    Past Surgical History:  Procedure Laterality Date   CLOSED REDUCTION HAND FRACTURE Right    COLONOSCOPY     LEFT HEART CATH AND CORS/GRAFTS ANGIOGRAPHY N/A 01/26/2018   Procedure: LEFT HEART CATH AND CORS/GRAFTS ANGIOGRAPHY;  Surgeon: Runell Gess, MD;  Location: [facility];  Service: Cardiovascular;  Laterality: N/A;   NISSEN FUNDOPLICATION  2004   Complete in [facility], [place]   POLYPECTOMY     TEE WITHOUT CARDIOVERSION N/A 10/26/2022   Procedure: TRANSESOPHAGEAL ECHOCARDIOGRAM;  Surgeon: Christell Constant, MD;  Location: [facility] INVAS[facility] CV [facility];  Service: Cardiovascular;  Laterality: N/A;    Current Medications: Current Meds  Medication Sig   allopurinol (ZYLOPRIM) 300 MG tablet Take 450 mg by mouth every evening.    aspirin EC 81 MG tablet Take 81 mg by mouth daily.   atorvastatin (LIPITOR) 40 MG tablet Take 40 mg by mouth at bedtime.   Coenzyme Q10 100 MG TABS Take 100 mg by mouth in the morning.   levocetirizine (XYZAL) 5 MG tablet Take 5 mg by mouth daily as needed for allergies.  LEXAPRO 10 MG tablet Take 10 mg by mouth daily.   triamcinolone (NASACORT ALLERGY 24HR) 55 MCG/ACT AERO nasal inhaler Place 1 spray into the nose as needed.     Allergies:   Patient has no known allergies.   Social History   Socioeconomic History   Marital status: Married    Spouse name: Not on file   Number of children: Not on file   Years of education: Not on file   Highest education level: Not on file  Occupational History   Not on file  Tobacco Use   Smoking status: Never   Smokeless tobacco: Never  Vaping Use   Vaping Use: Never used  Substance and Sexual Activity   Alcohol use: Never   Drug use: Never   Sexual activity: Not on file  Other Topics Concern    Not on file  Social History Narrative   Are you right handed or left handed? Right   Are you currently employed ? Yes   What is your current occupation? [name]   Do you live at home alone? Yes   Who lives with you?  Wife   What type of home do you live in: 1 story or 2 story? 2       Social Determinants of Corporate investment banker Strain: Not on file  Food Insecurity: Not on file  Transportation Needs: Not on file  Physical Activity: Not on file  Stress: Not on file  Social Connections: Not on file     Family History: The patient's family history includes Aortic aneurysm in his father; Atrial fibrillation in his mother; Colon polyps in his father; Coronary artery disease in his father; Transient ischemic attack in his mother. There is no history of Colon cancer, Esophageal cancer, Rectal cancer, Stomach cancer, or Liver disease.  ROS:   Please see the history of present illness.    All other systems reviewed and are negative.  EKGs/Labs/Other Studies Reviewed:    The following studies were reviewed today: Cardiac Studies & Procedures   CARDIAC CATHETERIZATION  CARDIAC CATHETERIZATION 01/26/2018  Narrative Images from the original result were not included.   The left ventricular systolic function is normal.  LV end diastolic pressure is normal.  The left ventricular ejection fraction is 55-65% by visual estimate.  Marvin Harvey is a 67 y.o. male   [id number] LOCATION:  FACILITY: [facility] PHYSICIAN: Nanetta Batty, M.D. [birth year]   DATE OF PROCEDURE:  01/26/2018  DATE OF DISCHARGE:     CARDIAC CATHETERIZATION    History obtained from chart review.Marvin Harvey is a 67 y.o. male with a hx of aortic root aneurysm which is followed in [place], h/o mitral regurgitation, OSA on CPAP, family history of CAD, PVD and valvular heart disease (father) as well as history of GERD, hiatal hernia s/p repair and Gout who is being seen today for the evaluation of chest  pain  Impression Mr. Woodrum has normal coronary arteries and normal LV function.  I believe his chest pain is noncardiac.  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  His right femoral arterial puncture site was hemostatic hemostatically sealed with a MYNX device successfully.  He left the lab in stable condition.  He will be gently hydrated overnight, and discharged home in the morning.  Nanetta Batty. MD, [facility] 01/26/2018 5:51 PM      Recommend Aspirin 81mg  daily for moderate CAD.  Findings Coronary Findings Diagnostic  Dominance: Right  No diagnostic findings have been documented. Intervention  No interventions have been documented.   STRESS TESTS  MYOCARDIAL PERFUSION IMAGING 03/09/2018   ECHOCARDIOGRAM  ECHOCARDIOGRAM COMPLETE 01/26/2018  Narrative *[place]* *[facility]* [address] [place], [place] [address] [phone]  ------------------------------------------------------------------- Echocardiography  Patient:    Ledell, Mooneyhan MR #:       [id number] Study Date: 01/26/2018 Gender:     M Age:        61 Height:     180.3 cm Weight:     94.2 kg BSA:        2.19 m^2 Pt. Status: Room:       [id number]  PERFORMING   [facility], Inpatient ATTENDING    Arby Barrette [id number] ADMITTING    Opyd, Lavone Neri ORDERING     Opyd, Timothy S REFERRING    Opyd, Timothy S SONOGRAPHER  Ilona Sorrel  cc:  ------------------------------------------------------------------- LV EF: 65% -   70%  ------------------------------------------------------------------- Indications:      Chest pain 786.51.  ------------------------------------------------------------------- History:   PMH:  Chest pain.  ------------------------------------------------------------------- Study Conclusions  - Left ventricle: The cavity size was normal. Wall thickness was normal. Systolic function was vigorous. The estimated  ejection fraction was in the range of 65% to 70%. Wall motion was normal; there were no regional wall motion abnormalities. Features are consistent with a pseudonormal left ventricular filling pattern, with concomitant abnormal relaxation and increased filling pressure (grade 2 diastolic dysfunction). - Aortic valve: There was mild regurgitation. Valve area (VTI): 2.84 cm^2. Valve area (Vmax): 2.89 cm^2. Valve area (Vmean): 2.96 cm^2.  ------------------------------------------------------------------- Study data:  No prior study was available for comparison.  Study status:  Routine.  Procedure:  The patient reported no pain pre or post test. Transthoracic echocardiography. Image quality was adequate.  Study completion:  There were no complications. Echocardiography.  M-mode, complete 2D, spectral Doppler, and color Doppler.  Birthdate:  Patient birthdate: November 13, 1955.  Age:  Patient is 67 yr old.  Sex:  Gender: male.    BMI: 29 kg/m^2.  Blood pressure:     117/79  Patient status:  Inpatient.  Study date: Study date: 01/26/2018. Study time: 08:33 AM.  Location:  Bedside.  -------------------------------------------------------------------  ------------------------------------------------------------------- Left ventricle:  The cavity size was normal. Wall thickness was normal. Systolic function was vigorous. The estimated ejection fraction was in the range of 65% to 70%. Wall motion was normal; there were no regional wall motion abnormalities. Features are consistent with a pseudonormal left ventricular filling pattern, with concomitant abnormal relaxation and increased filling pressure (grade 2 diastolic dysfunction).  ------------------------------------------------------------------- Aortic valve:   The valve appears to be grossly normal.    Doppler: There was no stenosis.   There was mild regurgitation.    VTI ratio of LVOT to aortic valve: 0.9. Valve area (VTI): 2.84  cm^2. Indexed valve area (VTI): 1.3 cm^2/m^2. Peak velocity ratio of LVOT to aortic valve: 0.92. Valve area (Vmax): 2.89 cm^2. Indexed valve area (Vmax): 1.32 cm^2/m^2. Mean velocity ratio of LVOT to aortic valve: 0.94. Valve area (Vmean): 2.96 cm^2. Indexed valve area (Vmean): 1.35 cm^2/m^2.    Mean gradient (S): 3 mm Hg. Peak gradient (S): 5 mm Hg.  ------------------------------------------------------------------- Aorta:  Ascending aorta: The ascending aorta was mildly dilated.  ------------------------------------------------------------------- Mitral valve:   The valve appears to be grossly normal.    Doppler: There was no significant regurgitation.    Valve area by pressure half-time: 3.06 cm^2. Indexed valve area  by pressure half-time: 1.4 cm^2/m^2.    Peak gradient (D): 2 mm Hg.  ------------------------------------------------------------------- Left atrium:  The atrium was normal in size.  ------------------------------------------------------------------- Right ventricle:  The cavity size was normal. Systolic function was normal.  ------------------------------------------------------------------- Pulmonic valve:    The valve appears to be grossly normal. Doppler:  There was no significant regurgitation.  ------------------------------------------------------------------- Tricuspid valve:   The valve appears to be grossly normal. Doppler:  There was trivial regurgitation.  ------------------------------------------------------------------- Pulmonary artery:   Systolic pressure was within the normal range.  ------------------------------------------------------------------- Pericardium:  There was no pericardial effusion.  ------------------------------------------------------------------- Systemic veins: Inferior vena cava: The vessel was normal in size. The respirophasic diameter changes were in the normal range (>= 50%), consistent with normal central venous  pressure.  ------------------------------------------------------------------- Measurements  Left ventricle                           Value          Reference LV ID, ED, PLAX chordal                  47    mm       43 - 52 LV ID, ES, PLAX chordal                  29    mm       23 - 38 LV fx shortening, PLAX chordal           38    %        >=29 LV PW thickness, ED                      10    mm       ---------- IVS/LV PW ratio, ED                      1              <=1.3 Stroke volume, 2D                        80    ml       ---------- Stroke volume/bsa, 2D                    36    ml/m^2   ---------- LV e&', lateral                           8.49  cm/s     ---------- LV E/e&', lateral                         9.26           ---------- LV s&', lateral                           11.6  cm/s     ---------- LV e&', medial                            6.42  cm/s     ---------- LV E/e&', medial                          12.24          ----------  LV e&', average                           7.46  cm/s     ---------- LV E/e&', average                         10.54          ----------  Ventricular septum                       Value          Reference IVS thickness, ED                        10    mm       ----------  LVOT                                     Value          Reference LVOT ID, S                               20    mm       ---------- LVOT area                                3.14  cm^2     ---------- LVOT peak velocity, S                    104   cm/s     ---------- LVOT mean velocity, S                    73.7  cm/s     ---------- LVOT VTI, S                              25.6  cm       ----------  Aortic valve                             Value          Reference Aortic valve peak velocity, S            113   cm/s     ---------- Aortic valve mean velocity, S            78.2  cm/s     ---------- Aortic valve VTI, S                      28.3  cm       ---------- Aortic mean gradient, S                   3     mm Hg    ---------- Aortic peak gradient, S                  5     mm Hg    ---------- VTI ratio, LVOT/AV  0.9            ---------- Aortic valve area, VTI                   2.84  cm^2     ---------- Aortic valve area/bsa, VTI               1.3   cm^2/m^2 ---------- Velocity ratio, peak, LVOT/AV            0.92           ---------- Aortic valve area, peak velocity         2.89  cm^2     ---------- Aortic valve area/bsa, peak              1.32  cm^2/m^2 ---------- velocity Velocity ratio, mean, LVOT/AV            0.94           ---------- Aortic valve area, mean velocity         2.96  cm^2     ---------- Aortic valve area/bsa, mean              1.35  cm^2/m^2 ---------- velocity Aortic regurg pressure half-time         784   ms       ----------  Aorta                                    Value          Reference Aortic root ID, ED                       43    mm       ----------  Left atrium                              Value          Reference LA ID, A-P, ES                           39    mm       ---------- LA ID/bsa, A-P                           1.78  cm/m^2   <=2.2 LA volume, S                             59.8  ml       ---------- LA volume/bsa, S                         27.3  ml/m^2   ---------- LA volume, ES, 1-p A4C                   54.4  ml       ---------- LA volume/bsa, ES, 1-p A4C               24.8  ml/m^2   ---------- LA volume, ES, 1-p A2C                   55.7  ml       ----------  LA volume/bsa, ES, 1-p A2C               25.4  ml/m^2   ----------  Mitral valve                             Value          Reference Mitral E-wave peak velocity              78.6  cm/s     ---------- Mitral A-wave peak velocity              70.3  cm/s     ---------- Mitral deceleration time         (H)     246   ms       150 - 230 Mitral pressure half-time                72    ms       ---------- Mitral peak gradient, D                  2     mm Hg     ---------- Mitral E/A ratio, peak                   1.1            ---------- Mitral valve area, PHT, DP               3.06  cm^2     ---------- Mitral valve area/bsa, PHT, DP           1.4   cm^2/m^2 ----------  Pulmonary arteries                       Value          Reference PA pressure, S, DP                       21    mm Hg    <=30  Tricuspid valve                          Value          Reference Tricuspid regurg peak velocity           213   cm/s     ---------- Tricuspid peak RV-RA gradient            18    mm Hg    ----------  Right atrium                             Value          Reference RA ID, S-I, ES, A4C              (H)     51.6  mm       34 - 49 RA area, ES, A4C                         13.6  cm^2     8.3 - 19.5 RA volume, ES, A/L                       28.6  ml       ----------  RA volume/bsa, ES, A/L                   13    ml/m^2   ----------  Systemic veins                           Value          Reference Estimated CVP                            3     mm Hg    ----------  Right ventricle                          Value          Reference TAPSE                                    28.6  mm       ---------- RV pressure, S, DP                       21    mm Hg    <=30  Legend: (L)  and  (H)  mark values outside specified reference range.  ------------------------------------------------------------------- Prepared and Electronically Authenticated by  Kristeen Miss, M.D. 2019-09-04T12:14:25   TEE  ECHO TEE 10/26/2022  Narrative TRANSESOPHOGEAL ECHO REPORT    Patient Name:   JERYL GALAZ Date of Exam: 10/26/2022 Medical Rec #:  [id number]   Height:       71.0 in Accession #:    [id number]  Weight:       220.9 lb Date of Birth:  November 25, 1955   BSA:          2.200 m Patient Age:    66 years    BP:           125/81 mmHg Patient Gender: M           HR:           67 bpm. Exam Location:  Inpatient  Procedure: 2D Echo, 3D Echo, Cardiac Doppler, Color Doppler and  Saline Contrast Bubble Study  Indications:    Abnormal echocardiogram [R93.1 (ICD-10-CM)]; History of CVA (cerebrovascular accident) [Z86.73 (ICD-10-CM)]  History:        Patient has prior history of Echocardiogram examinations, most recent 10/23/2017. Aortic root aneurysm.  Sonographer:    Dondra Prader RVT RCS Referring Phys: [id number] JESSE M CLEAVER  PROCEDURE: After discussion of the risks and benefits of a TEE, an informed consent was obtained from the patient. The transesophogeal probe was passed without difficulty through the esophogus of the patient. Sedation performed by different physician. The patient developed no complications during the procedure.  IMPRESSIONS   1. PFO noted. Agitated saline contrast bubble study was positive with shunting observed within 3-6 cardiac cycles suggestive of interatrial shunt. 2. Left ventricular ejection fraction, by estimation, is 55 to 60%. The left ventricle has normal function. 3. Right ventricular systolic function is normal. The right ventricular size is not well visualized. 4. No left atrial/left atrial appendage thrombus was detected. 5. The mitral valve is abnormal. Mild mitral valve regurgitation. 6. The aortic valve is tricuspid. Aortic valve regurgitation is mild to moderate. 7. Aortic dilatation noted. There is mild dilatation of the  aortic root, measuring 43 mm. There is mild dilatation of the ascending aorta, measuring 41 mm.  FINDINGS Left Ventricle: Left ventricular ejection fraction, by estimation, is 55 to 60%. The left ventricle has normal function. The left ventricular internal cavity size was normal in size.  Right Ventricle: The right ventricular size is not well visualized. Right vetricular wall thickness was not well visualized. Right ventricular systolic function is normal.  Left Atrium: Left atrial size was normal in size. No left atrial/left atrial appendage thrombus was detected.  Right Atrium: Right atrial size was  normal in size.  Pericardium: There is no evidence of pericardial effusion.  Mitral Valve: Bileaflet mitral valve prolapse. The mitral valve is abnormal. Mild mitral valve regurgitation.  Tricuspid Valve: The tricuspid valve is normal in structure. Tricuspid valve regurgitation is mild . No evidence of tricuspid stenosis.  Aortic Valve: The aortic valve is tricuspid. Aortic valve regurgitation is mild to moderate.  Pulmonic Valve: The pulmonic valve was normal in structure. Pulmonic valve regurgitation is trivial. No evidence of pulmonic stenosis.  Aorta: Aortic dilatation noted. There is mild dilatation of the aortic root, measuring 43 mm. There is mild dilatation of the ascending aorta, measuring 41 mm.  IAS/Shunts: The interatrial septum appears to be lipomatous. PFO noted. Agitated saline contrast was given intravenously to evaluate for intracardiac shunting. Agitated saline contrast bubble study was positive with shunting observed within 3-6 cardiac cycles suggestive of interatrial shunt.  Riley Lam MD Electronically signed by Riley Lam MD Signature Date/Time: 10/26/2022/4:53:50 PM    Final                 Recent Labs: 10/16/2022: BUN 13; Creatinine, Ser 0.95; Hemoglobin 15.1; Platelets 148; Potassium 4.4; Sodium 142  Recent Lipid Panel No results found for: "CHOL", "TRIG", "HDL", "CHOLHDL", "VLDL", "LDLCALC", "LDLDIRECT"   Risk Assessment/Calculations:           STOP-Bang Score:  4       Physical Exam:    VS:  BP 118/68   Pulse (!) 57   Ht 5\' 11"  (1.803 m)   Wt 209 lb 3.2 oz (94.9 kg)   SpO2 95%   BMI 29.18 kg/m     Wt Readings from Last 3 Encounters:  11/12/22 209 lb 3.2 oz (94.9 kg)  11/04/22 213 lb 3.2 oz (96.7 kg)  11/02/22 214 lb 6.4 oz (97.3 kg)     GEN:  Well nourished, well developed in no acute distress HEENT: Normal NECK: No JVD; No carotid bruits LYMPHATICS: No lymphadenopathy CARDIAC: RRR, no murmurs, rubs,  gallops RESPIRATORY:  Clear to auscultation without rales, wheezing or rhonchi  ABDOMEN: Soft, non-tender, non-distended MUSCULOSKELETAL:  No edema; No deformity  SKIN: Warm and dry NEUROLOGIC:  Alert and oriented x 3 PSYCHIATRIC:  Normal affect   ASSESSMENT:    1. PFO (patent foramen ovale)    PLAN:    In order of problems listed above:  The patient has a PFO and presented with cryptogenic stroke.  His cardiovascular risk factors are all well-controlled and were noted to be well-controlled even at the time of his stroke.  He is compliant with his medical program.  He is now on aspirin for antiplatelet therapy.  I have personally reviewed office records from other specialist.  I have reviewed a summary of his recent hospital studies that were done.  I personally reviewed his TEE images which show a large PFO with positive bubble study demonstrating right to left shunt.  There is  no other significant abnormality with no evidence of ventricular dysfunction or valvular disease.  We discussed the natural history of PFO and its potential association with stroke today.  We discussed trial data evaluating PFO closure versus medical therapy.  In this patient who otherwise has good risk factor control and who has a large PFO raising his recurrent stroke risk due to PFO anatomy, I think consideration of transcatheter PFO closure is recurrent stroke risk due to PFO anatomy, I think consideration of transcatheter PFO closure is reasonable and appropriate.  I demonstrated the PFO closure device with the patient today.  We reviewed the steps of transcatheter closure, expected recovery, and procedural risks.  He understands the risks of serious complication like stroke, myocardial infarction, cardiac injury, hemopericardium with need for pericardiocentesis, device embolization, arrhythmia, need for emergent cardiac surgery, and death, all occur at low frequency of less than 1%.  He provides full informed consent  for the procedure and would like to proceed at the next available time.  We also discussed the role of monitoring for atrial fibrillation and he is scheduled to see Dr. Royann Shivers on August 1 to discuss a 30-day monitor versus implantable loop recorder.  He prefers to proceed with PFO closure and not delay for several months to do monitoring and again I think this is appropriate considering the size of his PFO.           Medication Adjustments/Labs and Tests Ordered: Current medicines are reviewed at length with the patient today.  Concerns regarding medicines are outlined above.  No orders of the defined types were placed in this encounter.  No orders of the defined types were placed in this encounter.   Patient Instructions  Medication Instructions:  Your physician recommends that you continue on your current medications as directed. Please refer to the Current Medication list given to you today.  *If you need a refill on your cardiac medications before your next appointment, please call your pharmacy*  Follow-Up: At [facility], you and your health needs are our priority.  As part of our continuing mission to provide you with exceptional heart care, we have created designated Provider Care Teams.  These Care Teams include your primary Cardiologist (physician) and Advanced Practice Providers (APPs -  Physician Assistants and Nurse Practitioners) who all work together to provide you with the care you need, when you need it..    Your next appointment:   You will contacted with follow up appointments     Signed, [name], MD  11/12/2022 1:45 PM    [facility]

## 2022-11-12 NOTE — H&P (View-Only) (Signed)
Cardiology Office Note:    Date:  11/12/2022   ID:  Marvin Harvey, DOB 08/18/1955, MRN [id number]  PCP:  McGee, Rachel, DO   [facility] Providers Cardiologist:  Jonathan Berry, MD     Referring MD: McGee, Rachel, DO   Chief Complaint  Patient presents with   PFO Consult    History of Present Illness:    Marvin Harvey is a 66 y.o. male presenting for evaluation of PFO closure, referred by Dr. Jaffe.  The patient is here with his wife today.  He has a history of a stable aortic root aneurysm followed annually by Dr. Hendrickson.  He was in his normal state of health on Sep 28, 2022 when he developed symptoms of expressive aphasia and right hand numbness.  He has had resolution of his symptoms.  He has been seen in follow-up by Dr. Jaffe with neurology and his symptoms are suspected to have resulted from an embolic event.  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  In-hospital studies included a CT angiogram of the head and neck that showed no large vessel occlusive disease.  An echocardiogram was positive for PFO.  A lower extremity Doppler showed no DVT.  The patient took aspirin and clopidogrel for 21 days and has continued on aspirin.  He did complain of easy bruising on DAPT.  He wore a Zio patch but apparently the monitor was lost when he mailed it in.  He has had no documented atrial fibrillation in the past.  He does have occasional heart palpitations which are very brief in nature.  After hospital discharge, he underwent a transesophageal echo which demonstrated a large PFO.  He presents today for discussion of PFO closure.  The patient has no chest pain or shortness of breath.  He has no other complaints at this time.  Past Medical History:  Diagnosis Date   Allergy    SEASONAL   Aortic root aneurysm (HCC) 01/11/2018   4.7  centimeters at sinuses of Valsalva, 4.2 cm mid ascending   Arthritis    Cataract    BILATERAL   GERD (gastroesophageal reflux disease)    History of repair of hiatal hernia 2001   Sleep apnea    On CPAP    Past Surgical History:  Procedure Laterality Date   CLOSED REDUCTION HAND FRACTURE Right    COLONOSCOPY     LEFT HEART CATH AND CORS/GRAFTS ANGIOGRAPHY N/A 01/26/2018   Procedure: LEFT HEART CATH AND CORS/GRAFTS ANGIOGRAPHY;  Surgeon: Berry, Jonathan J, MD;  Location: [facility];  Service: Cardiovascular;  Laterality: N/A;   NISSEN FUNDOPLICATION  2004   Complete in [place], [place]   POLYPECTOMY     TEE WITHOUT CARDIOVERSION N/A 10/26/2022   Procedure: TRANSESOPHAGEAL ECHOCARDIOGRAM;  Surgeon: Chandrasekhar, Mahesh A, MD;  Location: [facility];  Service: Cardiovascular;  Laterality: N/A;    Current Medications: Current Meds  Medication Sig   allopurinol (ZYLOPRIM) 300 MG tablet Take 450 mg by mouth every evening.    aspirin EC 81 MG tablet Take 81 mg by mouth daily.   atorvastatin (LIPITOR) 40 MG tablet Take 40 mg by mouth at bedtime.   Coenzyme Q10 100 MG TABS Take 100 mg by mouth in the morning.   levocetirizine (XYZAL) 5 MG tablet Take 5 mg by mouth daily as needed for allergies.     LEXAPRO 10 MG tablet Take 10 mg by mouth daily.   triamcinolone (NASACORT ALLERGY 24HR) 55 MCG/ACT AERO nasal inhaler Place 1 spray into the nose as needed.     Allergies:   Patient has no known allergies.   Social History   Socioeconomic History   Marital status: Married    Spouse name: Not on file   Number of children: Not on file   Years of education: Not on file   Highest education level: Not on file  Occupational History   Not on file  Tobacco Use   Smoking status: Never   Smokeless tobacco: Never  Vaping Use   Vaping Use: Never used  Substance and Sexual Activity   Alcohol use: Never   Drug use: Never   Sexual activity: Not on file  Other Topics Concern    Not on file  Social History Narrative   Are you right handed or left handed? Right   Are you currently employed ? 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 Yes   Who lives with you?  Wife   What type of home do you live in: 1 story or 2 story? 2       Social Determinants of Health   Financial Resource Strain: Not on file  Food Insecurity: Not on file  Transportation Needs: Not on file  Physical Activity: Not on file  Stress: Not on file  Social Connections: Not on file     Family History: The patient's family history includes Aortic aneurysm in his father; Atrial fibrillation in his mother; Colon polyps in his father; Coronary artery disease in his father; Transient ischemic attack in his mother. There is no history of Colon cancer, Esophageal cancer, Rectal cancer, Stomach cancer, or Liver disease.  ROS:   Please see the history of present illness.    All other systems reviewed and are negative.  EKGs/Labs/Other Studies Reviewed:    The following studies were reviewed today: Cardiac Studies & Procedures   CARDIAC CATHETERIZATION  CARDIAC CATHETERIZATION 01/26/2018  Narrative Images from the original result were not included.   The left ventricular systolic function is normal.  LV end diastolic pressure is normal.  The left ventricular ejection fraction is 55-65% by visual estimate.  Ashlee Reffner is a 67 y.o. male   [id number] LOCATION:  FACILITY: [facility] PHYSICIAN: Jonathan Berry, M.D. 11/04/1955   DATE OF PROCEDURE:  01/26/2018  DATE OF DISCHARGE:     CARDIAC CATHETERIZATION    History obtained from chart review.Kris Heatherly is a 67 y.o. male with a hx of aortic root aneurysm which is followed in [facility], h/o mitral regurgitation, OSA on CPAP, family history of CAD, PVD and valvular heart disease (father) as well as history of GERD, hiatal hernia s/p repair and Gout who is being seen today for the evaluation of chest  pain  Impression Mr. Langstaff has normal coronary arteries and normal LV function.  I believe his chest pain is noncardiac.  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  His right femoral arterial puncture site was hemostatic hemostatically sealed with a MYNX device successfully.  He left the lab in stable condition.  He will be gently hydrated overnight, and discharged home in the morning.  Jonathan Berry. MD, FACC 01/26/2018 5:51 PM      Recommend Aspirin 81mg daily for moderate CAD.  Findings Coronary Findings Diagnostic    Dominance: Right  No diagnostic findings have been documented. Intervention  No interventions have been documented.   STRESS TESTS  MYOCARDIAL PERFUSION IMAGING 03/09/2018   ECHOCARDIOGRAM  ECHOCARDIOGRAM COMPLETE 01/26/2018  Narrative *[place]* *[facility]* [address] [place], [place] [address] [phone]  ------------------------------------------------------------------- Echocardiography  Patient:    Harvey, Marvin MR #:       [id number] Study Date: 01/26/2018 Gender:     M Age:        61 Height:     180.3 cm Weight:     94.2 kg BSA:        2.19 m^2 Pt. Status: Room:       4E21C  PERFORMING   [facility], Inpatient ATTENDING    Pfeiffer, Marcy [id number] ADMITTING    Opyd, Timothy S ORDERING     Opyd, Timothy S REFERRING    Opyd, Timothy S SONOGRAPHER  Guy, Leroy  cc:  ------------------------------------------------------------------- LV EF: 65% -   70%  ------------------------------------------------------------------- Indications:      Chest pain 786.51.  ------------------------------------------------------------------- History:   PMH:  Chest pain.  ------------------------------------------------------------------- Study Conclusions  - Left ventricle: The cavity size was normal. Wall thickness was normal. Systolic function was vigorous. The estimated  ejection fraction was in the range of 65% to 70%. Wall motion was normal; there were no regional wall motion abnormalities. Features are consistent with a pseudonormal left ventricular filling pattern, with concomitant abnormal relaxation and increased filling pressure (grade 2 diastolic dysfunction). - Aortic valve: There was mild regurgitation. Valve area (VTI): 2.84 cm^2. Valve area (Vmax): 2.89 cm^2. Valve area (Vmean): 2.96 cm^2.  ------------------------------------------------------------------- Study data:  No prior study was available for comparison.  Study status:  Routine.  Procedure:  The patient reported no pain pre or post test. Transthoracic echocardiography. Image quality was adequate.  Study completion:  There were no complications. Echocardiography.  M-mode, complete 2D, spectral Doppler, and color Doppler.  Birthdate:  Patient birthdate: 10/16/1955.  Age:  Patient is 67 yr old.  Sex:  Gender: male.    BMI: 29 kg/m^2.  Blood pressure:     117/79  Patient status:  Inpatient.  Study date: Study date: 01/26/2018. Study time: 08:33 AM.  Location:  Bedside.  -------------------------------------------------------------------  ------------------------------------------------------------------- Left ventricle:  The cavity size was normal. Wall thickness was normal. Systolic function was vigorous. The estimated ejection fraction was in the range of 65% to 70%. Wall motion was normal; there were no regional wall motion abnormalities. Features are consistent with a pseudonormal left ventricular filling pattern, with concomitant abnormal relaxation and increased filling pressure (grade 2 diastolic dysfunction).  ------------------------------------------------------------------- Aortic valve:   The valve appears to be grossly normal.    Doppler: There was no stenosis.   There was mild regurgitation.    VTI ratio of LVOT to aortic valve: 0.9. Valve area (VTI): 2.84  cm^2. Indexed valve area (VTI): 1.3 cm^2/m^2. Peak velocity ratio of LVOT to aortic valve: 0.92. Valve area (Vmax): 2.89 cm^2. Indexed valve area (Vmax): 1.32 cm^2/m^2. Mean velocity ratio of LVOT to aortic valve: 0.94. Valve area (Vmean): 2.96 cm^2. Indexed valve area (Vmean): 1.35 cm^2/m^2.    Mean gradient (S): 3 mm Hg. Peak gradient (S): 5 mm Hg.  ------------------------------------------------------------------- Aorta:  Ascending aorta: The ascending aorta was mildly dilated.  ------------------------------------------------------------------- Mitral valve:   The valve appears to be grossly normal.    Doppler: There was no significant regurgitation.    Valve area by pressure half-time: 3.06 cm^2. Indexed valve area   by pressure half-time: 1.4 cm^2/m^2.    Peak gradient (D): 2 mm Hg.  ------------------------------------------------------------------- Left atrium:  The atrium was normal in size.  ------------------------------------------------------------------- Right ventricle:  The cavity size was normal. Systolic function was normal.  ------------------------------------------------------------------- Pulmonic valve:    The valve appears to be grossly normal. Doppler:  There was no significant regurgitation.  ------------------------------------------------------------------- Tricuspid valve:   The valve appears to be grossly normal. Doppler:  There was trivial regurgitation.  ------------------------------------------------------------------- Pulmonary artery:   Systolic pressure was within the normal range.  ------------------------------------------------------------------- Pericardium:  There was no pericardial effusion.  ------------------------------------------------------------------- Systemic veins: Inferior vena cava: The vessel was normal in size. The respirophasic diameter changes were in the normal range (>= 50%), consistent with normal central venous  pressure.  ------------------------------------------------------------------- Measurements  Left ventricle                           Value          Reference LV ID, ED, PLAX chordal                  47    mm       43 - 52 LV ID, ES, PLAX chordal                  29    mm       23 - 38 LV fx shortening, PLAX chordal           38    %        >=29 LV PW thickness, ED                      10    mm       ---------- IVS/LV PW ratio, ED                      1              <=1.3 Stroke volume, 2D                        80    ml       ---------- Stroke volume/bsa, 2D                    36    ml/m^2   ---------- LV e&', lateral                           8.49  cm/s     ---------- LV E/e&', lateral                         9.26           ---------- LV s&', lateral                           11.6  cm/s     ---------- LV e&', medial                            6.42  cm/s     ---------- LV E/e&', medial                          12.24          ----------   LV e&', average                           7.46  cm/s     ---------- LV E/e&', average                         10.54          ----------  Ventricular septum                       Value          Reference IVS thickness, ED                        10    mm       ----------  LVOT                                     Value          Reference LVOT ID, S                               20    mm       ---------- LVOT area                                3.14  cm^2     ---------- LVOT peak velocity, S                    104   cm/s     ---------- LVOT mean velocity, S                    73.7  cm/s     ---------- LVOT VTI, S                              25.6  cm       ----------  Aortic valve                             Value          Reference Aortic valve peak velocity, S            113   cm/s     ---------- Aortic valve mean velocity, S            78.2  cm/s     ---------- Aortic valve VTI, S                      28.3  cm       ---------- Aortic mean gradient, S                   3     mm Hg    ---------- Aortic peak gradient, S                  5     mm Hg    ---------- VTI ratio, LVOT/AV                         0.9            ---------- Aortic valve area, VTI                   2.84  cm^2     ---------- Aortic valve area/bsa, VTI               1.3   cm^2/m^2 ---------- Velocity ratio, peak, LVOT/AV            0.92           ---------- Aortic valve area, peak velocity         2.89  cm^2     ---------- Aortic valve area/bsa, peak              1.32  cm^2/m^2 ---------- velocity Velocity ratio, mean, LVOT/AV            0.94           ---------- Aortic valve area, mean velocity         2.96  cm^2     ---------- Aortic valve area/bsa, mean              1.35  cm^2/m^2 ---------- velocity Aortic regurg pressure half-time         784   ms       ----------  Aorta                                    Value          Reference Aortic root ID, ED                       43    mm       ----------  Left atrium                              Value          Reference LA ID, A-P, ES                           39    mm       ---------- LA ID/bsa, A-P                           1.78  cm/m^2   <=2.2 LA volume, S                             59.8  ml       ---------- LA volume/bsa, S                         27.3  ml/m^2   ---------- LA volume, ES, 1-p A4C                   54.4  ml       ---------- LA volume/bsa, ES, 1-p A4C               24.8  ml/m^2   ---------- LA volume, ES, 1-p A2C                   55.7  ml       ----------   LA volume/bsa, ES, 1-p A2C               25.4  ml/m^2   ----------  Mitral valve                             Value          Reference Mitral E-wave peak velocity              78.6  cm/s     ---------- Mitral A-wave peak velocity              70.3  cm/s     ---------- Mitral deceleration time         (H)     246   ms       150 - 230 Mitral pressure half-time                72    ms       ---------- Mitral peak gradient, D                  2     mm Hg     ---------- Mitral E/A ratio, peak                   1.1            ---------- Mitral valve area, PHT, DP               3.06  cm^2     ---------- Mitral valve area/bsa, PHT, DP           1.4   cm^2/m^2 ----------  Pulmonary arteries                       Value          Reference PA pressure, S, DP                       21    mm Hg    <=30  Tricuspid valve                          Value          Reference Tricuspid regurg peak velocity           213   cm/s     ---------- Tricuspid peak RV-RA gradient            18    mm Hg    ----------  Right atrium                             Value          Reference RA ID, S-I, ES, A4C              (H)     51.6  mm       34 - 49 RA area, ES, A4C                         13.6  cm^2     8.3 - 19.5 RA volume, ES, A/L                       28.6  ml       ----------   RA volume/bsa, ES, A/L                   13    ml/m^2   ----------  Systemic veins                           Value          Reference Estimated CVP                            3     mm Hg    ----------  Right ventricle                          Value          Reference TAPSE                                    28.6  mm       ---------- RV pressure, S, DP                       21    mm Hg    <=30  Legend: (L)  and  (H)  mark values outside specified reference range.  ------------------------------------------------------------------- Prepared and Electronically Authenticated by  Philip Nahser, M.D. 2019-09-04T12:14:25   TEE  ECHO TEE 10/26/2022  Narrative TRANSESOPHOGEAL ECHO REPORT    Patient Name:   Marvin Harvey Date of Exam: 10/26/2022 Medical Rec #:  [id number]   Height:       71.0 in Accession #:    [id number]  Weight:       220.9 lb Date of Birth:  05/11/1956   BSA:          2.200 m Patient Age:    66 years    BP:           125/81 mmHg Patient Gender: M           HR:           67 bpm. Exam Location:  Inpatient  Procedure: 2D Echo, 3D Echo, Cardiac Doppler, Color Doppler and  Saline Contrast Bubble Study  Indications:    Abnormal echocardiogram [R93.1 (ICD-10-CM)]; History of CVA (cerebrovascular accident) [Z86.73 (ICD-10-CM)]  History:        Patient has prior history of Echocardiogram examinations, most recent 10/23/2017. Aortic root aneurysm.  Sonographer:    Gina Gealow RVT RCS Referring Phys: [id number] JESSE M CLEAVER  PROCEDURE: After discussion of the risks and benefits of a TEE, an informed consent was obtained from the patient. The transesophogeal probe was passed without difficulty through the esophogus of the patient. Sedation performed by different physician. The patient developed no complications during the procedure.  IMPRESSIONS   1. PFO noted. Agitated saline contrast bubble study was positive with shunting observed within 3-6 cardiac cycles suggestive of interatrial shunt. 2. Left ventricular ejection fraction, by estimation, is 55 to 60%. The left ventricle has normal function. 3. Right ventricular systolic function is normal. The right ventricular size is not well visualized. 4. No left atrial/left atrial appendage thrombus was detected. 5. The mitral valve is abnormal. Mild mitral valve regurgitation. 6. The aortic valve is tricuspid. Aortic valve regurgitation is mild to moderate. 7. Aortic dilatation noted. There is mild dilatation of the   aortic root, measuring 43 mm. There is mild dilatation of the ascending aorta, measuring 41 mm.  FINDINGS Left Ventricle: Left ventricular ejection fraction, by estimation, is 55 to 60%. The left ventricle has normal function. The left ventricular internal cavity size was normal in size.  Right Ventricle: The right ventricular size is not well visualized. Right vetricular wall thickness was not well visualized. Right ventricular systolic function is normal.  Left Atrium: Left atrial size was normal in size. No left atrial/left atrial appendage thrombus was detected.  Right Atrium: Right atrial size was  normal in size.  Pericardium: There is no evidence of pericardial effusion.  Mitral Valve: Bileaflet mitral valve prolapse. The mitral valve is abnormal. Mild mitral valve regurgitation.  Tricuspid Valve: The tricuspid valve is normal in structure. Tricuspid valve regurgitation is mild . No evidence of tricuspid stenosis.  Aortic Valve: The aortic valve is tricuspid. Aortic valve regurgitation is mild to moderate.  Pulmonic Valve: The pulmonic valve was normal in structure. Pulmonic valve regurgitation is trivial. No evidence of pulmonic stenosis.  Aorta: Aortic dilatation noted. There is mild dilatation of the aortic root, measuring 43 mm. There is mild dilatation of the ascending aorta, measuring 41 mm.  IAS/Shunts: The interatrial septum appears to be lipomatous. PFO noted. Agitated saline contrast was given intravenously to evaluate for intracardiac shunting. Agitated saline contrast bubble study was positive with shunting observed within 3-6 cardiac cycles suggestive of interatrial shunt.  Mahesh Chandrasekhar MD Electronically signed by Mahesh Chandrasekhar MD Signature Date/Time: 10/26/2022/4:53:50 PM    Final                 Recent Labs: 10/16/2022: BUN 13; Creatinine, Ser 0.95; Hemoglobin 15.1; Platelets 148; Potassium 4.4; Sodium 142  Recent Lipid Panel No results found for: "CHOL", "TRIG", "HDL", "CHOLHDL", "VLDL", "LDLCALC", "LDLDIRECT"   Risk Assessment/Calculations:           STOP-Bang Score:  4       Physical Exam:    VS:  BP 118/68   Pulse (!) 57   Ht 5' 11" (1.803 m)   Wt 209 lb 3.2 oz (94.9 kg)   SpO2 95%   BMI 29.18 kg/m     Wt Readings from Last 3 Encounters:  11/12/22 209 lb 3.2 oz (94.9 kg)  11/04/22 213 lb 3.2 oz (96.7 kg)  11/02/22 214 lb 6.4 oz (97.3 kg)     GEN:  Well nourished, well developed in no acute distress HEENT: Normal NECK: No JVD; No carotid bruits LYMPHATICS: No lymphadenopathy CARDIAC: RRR, no murmurs, rubs,  gallops RESPIRATORY:  Clear to auscultation without rales, wheezing or rhonchi  ABDOMEN: Soft, non-tender, non-distended MUSCULOSKELETAL:  No edema; No deformity  SKIN: Warm and dry NEUROLOGIC:  Alert and oriented x 3 PSYCHIATRIC:  Normal affect   ASSESSMENT:    1. PFO (patent foramen ovale)    PLAN:    In order of problems listed above:  The patient has a PFO and presented with cryptogenic stroke.  His cardiovascular risk factors are all well-controlled and were noted to be well-controlled even at the time of his stroke.  He is compliant with his medical program.  He is now on aspirin for antiplatelet therapy.  I have personally reviewed office records from other specialist.  I have reviewed a summary of his recent hospital studies that were done.  I personally reviewed his TEE images which show a large PFO with positive bubble study demonstrating right to left shunt.  There is   no other significant abnormality with no evidence of ventricular dysfunction or valvular disease.  We discussed the natural history of PFO and its potential association with stroke today.  We discussed trial data evaluating PFO closure versus medical therapy.  In this patient who otherwise has good risk factor control and who has a large PFO raising his recurrent stroke risk due to PFO anatomy, I think consideration of transcatheter PFO closure is recurrent stroke risk due to PFO anatomy, I think consideration of transcatheter PFO closure is reasonable and appropriate.  I demonstrated the PFO closure device with the patient today.  We reviewed the steps of transcatheter closure, expected recovery, and procedural risks.  He understands the risks of serious complication like stroke, myocardial infarction, cardiac injury, hemopericardium with need for pericardiocentesis, device embolization, arrhythmia, need for emergent cardiac surgery, and death, all occur at low frequency of less than 1%.  He provides full informed consent  for the procedure and would like to proceed at the next available time.  We also discussed the role of monitoring for atrial fibrillation and he is scheduled to see Dr. Croitoru on August 1 to discuss a 30-day monitor versus implantable loop recorder.  He prefers to proceed with PFO closure and not delay for several months to do monitoring and again I think this is appropriate considering the size of his PFO.           Medication Adjustments/Labs and Tests Ordered: Current medicines are reviewed at length with the patient today.  Concerns regarding medicines are outlined above.  No orders of the defined types were placed in this encounter.  No orders of the defined types were placed in this encounter.   Patient Instructions  Medication Instructions:  Your physician recommends that you continue on your current medications as directed. Please refer to the Current Medication list given to you today.  *If you need a refill on your cardiac medications before your next appointment, please call your pharmacy*  Follow-Up: At [facility], you and your health needs are our priority.  As part of our continuing mission to provide you with exceptional heart care, we have created designated Provider Care Teams.  These Care Teams include your primary Cardiologist (physician) and Advanced Practice Providers (APPs -  Physician Assistants and Nurse Practitioners) who all work together to provide you with the care you need, when you need it..    Your next appointment:   You will contacted with follow up appointments     Signed, Kenwood Rosiak, MD  11/12/2022 1:45 PM    [facility] 

## 2022-11-12 NOTE — Patient Instructions (Signed)
Medication Instructions:  Your physician recommends that you continue on your current medications as directed. Please refer to the Current Medication list given to you today.  *If you need a refill on your cardiac medications before your next appointment, please call your pharmacy*  Follow-Up: At [facility], you and your health needs are our priority.  As part of our continuing mission to provide you with exceptional heart care, we have created designated Provider Care Teams.  These Care Teams include your primary Cardiologist (physician) and Advanced Practice Providers (APPs -  Physician Assistants and Nurse Practitioners) who all work together to provide you with the care you need, when you need it..    Your next appointment:   You will contacted with follow up appointments

## 2022-11-13 ENCOUNTER — Encounter: Payer: Self-pay | Admitting: Physician Assistant

## 2022-11-13 ENCOUNTER — Other Ambulatory Visit: Payer: Self-pay | Admitting: Physician Assistant

## 2022-11-13 DIAGNOSIS — Z01812 Encounter for preprocedural laboratory examination: Secondary | ICD-10-CM

## 2022-11-13 DIAGNOSIS — Q2112 Patent foramen ovale: Secondary | ICD-10-CM

## 2022-11-24 LAB — BASIC METABOLIC PANEL
BUN/Creatinine Ratio: 11 (ref 10–24)
BUN: 11 mg/dL (ref 8–27)
CO2: 26 mmol/L (ref 20–29)
Calcium: 9.9 mg/dL (ref 8.6–10.2)
Chloride: 102 mmol/L (ref 96–106)
Creatinine, Ser: 0.96 mg/dL (ref 0.76–1.27)
Glucose: 98 mg/dL (ref 70–99)
Potassium: 4.9 mmol/L (ref 3.5–5.2)
Sodium: 139 mmol/L (ref 134–144)
eGFR: 87 mL/min/{1.73_m2} (ref >59)

## 2022-11-24 LAB — CBC
Hematocrit: 46.2 % (ref 37.5–51.0)
Hemoglobin: 15.8 g/dL (ref 13.0–17.7)
MCH: 30 pg (ref 26.6–33.0)
MCHC: 34.2 g/dL (ref 31.5–35.7)
MCV: 88 fL (ref 79–97)
Platelets: 130 10*3/uL — ABNORMAL LOW (ref 150–450)
RBC: 5.27 x10E6/uL (ref 4.14–5.80)
RDW: 13.7 % (ref 11.6–15.4)
WBC: 6.6 10*3/uL (ref 3.4–10.8)

## 2022-12-03 ENCOUNTER — Telehealth: Payer: Self-pay | Admitting: *Deleted

## 2022-12-03 NOTE — Telephone Encounter (Signed)
PFO closure scheduled at [facility] for: Friday December 04, 2022 7:30 AM Arrival time [facility] Main Entrance A at: 5:30 AM  Nothing to eat after midnight prior to procedure, clear liquids until 5 AM day of procedure.  Medication instructions: -Usual morning medications can be taken with sips of water including aspirin 81 mg.  Confirmed patient has responsible adult to drive home post procedure and be with patient first 24 hours after arriving home.  Plan to go home the same day, you will only stay overnight if medically necessary.  Reviewed procedure instructions with patient.

## 2022-12-04 ENCOUNTER — Ambulatory Visit (HOSPITAL_BASED_OUTPATIENT_CLINIC_OR_DEPARTMENT_OTHER): Payer: Medicare Other

## 2022-12-04 ENCOUNTER — Other Ambulatory Visit: Payer: Self-pay

## 2022-12-04 ENCOUNTER — Ambulatory Visit (HOSPITAL_COMMUNITY)
Admission: RE | Admit: 2022-12-04 | Discharge: 2022-12-04 | Disposition: A | Discharge status: Home / Self Care | Payer: Medicare Other | Attending: Cardiovascular Disease | Admitting: Cardiovascular Disease

## 2022-12-04 ENCOUNTER — Encounter (HOSPITAL_COMMUNITY)
Admission: RE | Disposition: A | Discharge status: Home / Self Care | Payer: Self-pay | Source: Home / Self Care | Attending: Cardiovascular Disease

## 2022-12-04 DIAGNOSIS — Z8673 Personal history of transient ischemic attack (TIA), and cerebral infarction without residual deficits: Secondary | ICD-10-CM | POA: Insufficient documentation

## 2022-12-04 DIAGNOSIS — I739 Peripheral vascular disease, unspecified: Secondary | ICD-10-CM | POA: Diagnosis not present

## 2022-12-04 DIAGNOSIS — Z7982 Long term (current) use of aspirin: Secondary | ICD-10-CM | POA: Insufficient documentation

## 2022-12-04 DIAGNOSIS — M109 Gout, unspecified: Secondary | ICD-10-CM | POA: Insufficient documentation

## 2022-12-04 DIAGNOSIS — Q2112 Patent foramen ovale: Secondary | ICD-10-CM

## 2022-12-04 DIAGNOSIS — K219 Gastro-esophageal reflux disease without esophagitis: Secondary | ICD-10-CM | POA: Insufficient documentation

## 2022-12-04 DIAGNOSIS — G4733 Obstructive sleep apnea (adult) (pediatric): Secondary | ICD-10-CM | POA: Insufficient documentation

## 2022-12-04 HISTORY — PX: PATENT FORAMEN OVALE(PFO) CLOSURE: CATH118300

## 2022-12-04 LAB — ECHOCARDIOGRAM LIMITED
Height: 71 in
Weight: 3280 oz

## 2022-12-04 LAB — POCT ACTIVATED CLOTTING TIME
Activated Clotting Time: 238 seconds
Activated Clotting Time: 244 seconds

## 2022-12-04 SURGERY — PATENT FORAMEN OVALE (PFO) CLOSURE
Anesthesia: LOCAL

## 2022-12-04 MED ORDER — SODIUM CHLORIDE 0.9 % WEIGHT BASED INFUSION
3.0000 mL/kg/h | INTRAVENOUS | Status: AC
  Administered 2022-12-04: 3 mL/kg/h via INTRAVENOUS

## 2022-12-04 MED ORDER — SODIUM CHLORIDE 0.9% FLUSH: 3.0000 mL | INTRAVENOUS | Status: DC | PRN

## 2022-12-04 MED ORDER — SODIUM CHLORIDE 0.9 % IV SOLN: INTRAVENOUS | Status: AC

## 2022-12-04 MED ORDER — HYDRALAZINE HCL 20 MG/ML IJ SOLN: 10.0000 mg | INTRAMUSCULAR | Status: DC | PRN

## 2022-12-04 MED ORDER — SODIUM CHLORIDE 0.9 % IV SOLN: 250.0000 mL | INTRAVENOUS | Status: DC | PRN

## 2022-12-04 MED ORDER — HEPARIN SODIUM (PORCINE) 1000 UNIT/ML IJ SOLN
INTRAMUSCULAR | Status: DC | PRN
  Administered 2022-12-04: 2000 [IU] via INTRAVENOUS
  Administered 2022-12-04: 11000 [IU] via INTRAVENOUS

## 2022-12-04 MED ORDER — CEFAZOLIN SODIUM-DEXTROSE 2-3 GM-%(50ML) IV SOLR
INTRAVENOUS | Status: DC | PRN
  Administered 2022-12-04: 2 g via INTRAVENOUS

## 2022-12-04 MED ORDER — CLOPIDOGREL BISULFATE 75 MG PO TABS
300.0000 mg | ORAL_TABLET | Freq: Once | ORAL | Status: AC
  Administered 2022-12-04: 300 mg via ORAL
  Filled 2022-12-04: qty 4

## 2022-12-04 MED ORDER — ONDANSETRON HCL 4 MG/2ML IJ SOLN: 4.0000 mg | Freq: Four times a day (QID) | INTRAMUSCULAR | Status: DC | PRN

## 2022-12-04 MED ORDER — LABETALOL HCL 5 MG/ML IV SOLN: 10.0000 mg | INTRAVENOUS | Status: DC | PRN

## 2022-12-04 MED ORDER — MIDAZOLAM HCL 2 MG/2ML IJ SOLN
INTRAMUSCULAR | Status: DC | PRN
  Administered 2022-12-04 (×2): 2 mg via INTRAVENOUS

## 2022-12-04 MED ORDER — ASPIRIN 81 MG PO CHEW: 81.0000 mg | CHEWABLE_TABLET | ORAL | Status: AC

## 2022-12-04 MED ORDER — OXYCODONE HCL 5 MG PO TABS: 5.0000 mg | ORAL_TABLET | ORAL | Status: DC | PRN

## 2022-12-04 MED ORDER — HEPARIN SODIUM (PORCINE) 1000 UNIT/ML IJ SOLN
INTRAMUSCULAR | Status: AC
  Filled 2022-12-04: qty 10

## 2022-12-04 MED ORDER — CEFAZOLIN SODIUM-DEXTROSE 2-4 GM/100ML-% IV SOLN
2.0000 g | INTRAVENOUS | Status: DC
  Filled 2022-12-04: qty 100

## 2022-12-04 MED ORDER — FENTANYL CITRATE (PF) 100 MCG/2ML IJ SOLN
INTRAMUSCULAR | Status: DC | PRN
  Administered 2022-12-04 (×2): 25 ug via INTRAVENOUS

## 2022-12-04 MED ORDER — ACETAMINOPHEN 325 MG PO TABS: 650.0000 mg | ORAL_TABLET | ORAL | Status: DC | PRN

## 2022-12-04 MED ORDER — FENTANYL CITRATE (PF) 100 MCG/2ML IJ SOLN
INTRAMUSCULAR | Status: AC
  Filled 2022-12-04: qty 2

## 2022-12-04 MED ORDER — DIAZEPAM 5 MG PO TABS: 5.0000 mg | ORAL_TABLET | Freq: Four times a day (QID) | ORAL | Status: DC | PRN

## 2022-12-04 MED ORDER — MIDAZOLAM HCL 2 MG/2ML IJ SOLN
INTRAMUSCULAR | Status: AC
  Filled 2022-12-04: qty 2

## 2022-12-04 MED ORDER — SODIUM CHLORIDE 0.9% FLUSH: 3.0000 mL | Freq: Two times a day (BID) | INTRAVENOUS | Status: DC

## 2022-12-04 MED ORDER — LIDOCAINE-EPINEPHRINE 1 %-1:100000 IJ SOLN
INTRAMUSCULAR | Status: AC
  Filled 2022-12-04: qty 1

## 2022-12-04 MED ORDER — SODIUM CHLORIDE 0.9 % WEIGHT BASED INFUSION: 1.0000 mL/kg/h | INTRAVENOUS | Status: DC

## 2022-12-04 MED ORDER — CLOPIDOGREL BISULFATE 75 MG PO TABS: 75.0000 mg | ORAL_TABLET | Freq: Every day | ORAL | 11 refills | Status: DC

## 2022-12-04 MED ORDER — HEPARIN (PORCINE) IN NACL 1000-0.9 UT/500ML-% IV SOLN
INTRAVENOUS | Status: DC | PRN
  Administered 2022-12-04 (×2): 500 mL

## 2022-12-04 MED ORDER — LIDOCAINE HCL (PF) 1 % IJ SOLN
INTRAMUSCULAR | Status: DC | PRN
  Administered 2022-12-04: 20 mL

## 2022-12-04 SURGICAL SUPPLY — 17 items
CATH ACUNAV 8FR 90CM (CATHETERS) IMPLANT
CATH EXPO 5F MPA-1 (CATHETERS) IMPLANT
CLOSURE PERCLOSE PROSTYLE (VASCULAR PRODUCTS) IMPLANT
COVER SWIFTLINK CONNECTOR (BAG) IMPLANT
GUIDEWIRE ANGLED .035X150CM (WIRE) IMPLANT
KIT HEART LEFT (KITS) ×1 IMPLANT
OCCLUDER PFO TALISMAN 25-18 (Prosthesis & Implant Heart) IMPLANT
PACK CARDIAC CATHETERIZATION (CUSTOM PROCEDURE TRAY) ×1 IMPLANT
SHEATH DELIVERY TALISMAN 9F 80 (SHEATH) IMPLANT
SHEATH INTROD W/O MIN 9FR 25CM (SHEATH) IMPLANT
SHEATH PINNACLE 8F 10CM (SHEATH) IMPLANT
TALISMAN DELIVERY SHEATH 9F 80 (SHEATH) ×1
TALISMAN PFO OCCLUDER 25-18 (Prosthesis & Implant Heart) ×1 IMPLANT
TRANSDUCER W/STOPCOCK (MISCELLANEOUS) ×1 IMPLANT
TUBING CIL FLEX 10 FLL-RA (TUBING) ×1 IMPLANT
WIRE EMERALD 3MM-J .035X150CM (WIRE) IMPLANT
WIRE ROSEN-J .035X260CM (WIRE) IMPLANT

## 2022-12-04 NOTE — Interval H&P Note (Signed)
History and Physical Interval Note:  12/04/2022 8:57 AM  Marvin Harvey  has presented today for surgery, with the diagnosis of pfo.  The various methods of treatment have been discussed with the patient and family. After consideration of risks, benefits and other options for treatment, the patient has consented to  Procedure(s): PATENT FORAMEN OVALE(PFO) CLOSURE (N/A) as a surgical intervention.  The patient's history has been reviewed, patient examined, no change in status, stable for surgery.  I have reviewed the patient's chart and labs.  Questions were answered to the patient's satisfaction.     [name]

## 2022-12-04 NOTE — Discharge Instructions (Addendum)

## 2022-12-04 NOTE — Progress Notes (Signed)
  [facility]   MULTIDISCIPLINARY HEART VALVE TEAM  Pt seen in the recovery area for persistent groin oozing. No pain or hematoma. I injected him with lidocaine with epinephrine and held manual pressure for >5 minutes. He continued to have very superficial venous oozing. I applied a silver nitrate swab with good hemostasis. Will have him lay down for 1 more hour and then ambulate. If no re bleeding, he can be discharged home.  Cline Crock PA-C  [facility]  Pager [phone]

## 2022-12-07 ENCOUNTER — Encounter (HOSPITAL_COMMUNITY): Payer: Self-pay | Admitting: Cardiovascular Disease

## 2022-12-14 ENCOUNTER — Emergency Department (HOSPITAL_COMMUNITY): Payer: Medicare Other

## 2022-12-14 ENCOUNTER — Inpatient Hospital Stay (HOSPITAL_COMMUNITY)
Admission: EM | Admit: 2022-12-14 | Discharge: 2022-12-17 | DRG: 310 | Disposition: A | Discharge status: Home / Self Care | Payer: Medicare Other | Attending: Internal Medicine | Admitting: Internal Medicine

## 2022-12-14 ENCOUNTER — Encounter (HOSPITAL_COMMUNITY): Payer: Self-pay | Admitting: Emergency Medicine

## 2022-12-14 ENCOUNTER — Other Ambulatory Visit: Payer: Self-pay

## 2022-12-14 DIAGNOSIS — Z7982 Long term (current) use of aspirin: Secondary | ICD-10-CM

## 2022-12-14 DIAGNOSIS — Z79899 Other long term (current) drug therapy: Secondary | ICD-10-CM

## 2022-12-14 DIAGNOSIS — Z8774 Personal history of (corrected) congenital malformations of heart and circulatory system: Secondary | ICD-10-CM

## 2022-12-14 DIAGNOSIS — M1990 Unspecified osteoarthritis, unspecified site: Secondary | ICD-10-CM | POA: Diagnosis present

## 2022-12-14 DIAGNOSIS — I719 Aortic aneurysm of unspecified site, without rupture: Secondary | ICD-10-CM | POA: Diagnosis present

## 2022-12-14 DIAGNOSIS — I4891 Unspecified atrial fibrillation: Principal | ICD-10-CM | POA: Diagnosis present

## 2022-12-14 DIAGNOSIS — K219 Gastro-esophageal reflux disease without esophagitis: Secondary | ICD-10-CM | POA: Diagnosis present

## 2022-12-14 DIAGNOSIS — Z8673 Personal history of transient ischemic attack (TIA), and cerebral infarction without residual deficits: Secondary | ICD-10-CM

## 2022-12-14 DIAGNOSIS — R002 Palpitations: Secondary | ICD-10-CM | POA: Diagnosis not present

## 2022-12-14 DIAGNOSIS — G4733 Obstructive sleep apnea (adult) (pediatric): Secondary | ICD-10-CM | POA: Diagnosis present

## 2022-12-14 DIAGNOSIS — E782 Mixed hyperlipidemia: Secondary | ICD-10-CM | POA: Insufficient documentation

## 2022-12-14 DIAGNOSIS — Z7902 Long term (current) use of antithrombotics/antiplatelets: Secondary | ICD-10-CM

## 2022-12-14 DIAGNOSIS — H269 Unspecified cataract: Secondary | ICD-10-CM | POA: Diagnosis present

## 2022-12-14 DIAGNOSIS — R000 Tachycardia, unspecified: Secondary | ICD-10-CM | POA: Diagnosis present

## 2022-12-14 DIAGNOSIS — Z8249 Family history of ischemic heart disease and other diseases of the circulatory system: Secondary | ICD-10-CM

## 2022-12-14 LAB — CBC
HCT: 43.6 % (ref 39.0–52.0)
Hemoglobin: 14.8 g/dL (ref 13.0–17.0)
MCH: 29.8 pg (ref 26.0–34.0)
MCHC: 33.9 g/dL (ref 30.0–36.0)
MCV: 87.9 fL (ref 80.0–100.0)
Platelets: 142 10*3/uL — ABNORMAL LOW (ref 150–400)
RBC: 4.96 MIL/uL (ref 4.22–5.81)
RDW: 14.1 % (ref 11.5–15.5)
WBC: 7.1 10*3/uL (ref 4.0–10.5)
nRBC: 0 % (ref 0.0–0.2)

## 2022-12-14 LAB — BASIC METABOLIC PANEL
Anion gap: 8 (ref 5–15)
BUN: 15 mg/dL (ref 8–23)
CO2: 25 mmol/L (ref 22–32)
Calcium: 8.8 mg/dL — ABNORMAL LOW (ref 8.9–10.3)
Chloride: 104 mmol/L (ref 98–111)
Creatinine, Ser: 1 mg/dL (ref 0.61–1.24)
GFR, Estimated: >60 mL/min (ref >60)
Glucose, Bld: 89 mg/dL (ref 70–99)
Potassium: 3.7 mmol/L (ref 3.5–5.1)
Sodium: 137 mmol/L (ref 135–145)

## 2022-12-14 LAB — APTT: aPTT: 27 seconds (ref 24–36)

## 2022-12-14 LAB — TROPONIN I (HIGH SENSITIVITY)
Troponin I (High Sensitivity): 3 ng/L (ref <18)
Troponin I (High Sensitivity): 4 ng/L (ref <18)

## 2022-12-14 LAB — PROTIME-INR
INR: 1.1 (ref 0.8–1.2)
Prothrombin Time: 13.9 seconds (ref 11.4–15.2)

## 2022-12-14 MED ORDER — HEPARIN BOLUS VIA INFUSION
5000.0000 [IU] | Freq: Once | INTRAVENOUS | Status: AC
  Administered 2022-12-14: 5000 [IU] via INTRAVENOUS

## 2022-12-14 MED ORDER — HEPARIN (PORCINE) 25000 UT/250ML-% IV SOLN
1400.0000 [IU]/h | INTRAVENOUS | Status: DC
  Administered 2022-12-14 – 2022-12-16 (×3): 1400 [IU]/h via INTRAVENOUS
  Filled 2022-12-14 (×3): qty 250

## 2022-12-14 MED ORDER — SODIUM CHLORIDE 0.9 % IV BOLUS
500.0000 mL | Freq: Once | INTRAVENOUS | Status: AC
  Administered 2022-12-14: 500 mL via INTRAVENOUS

## 2022-12-14 MED ORDER — METOPROLOL TARTRATE 25 MG PO TABS
12.5000 mg | ORAL_TABLET | Freq: Once | ORAL | Status: AC
  Administered 2022-12-14: 12.5 mg via ORAL
  Filled 2022-12-14: qty 1

## 2022-12-14 NOTE — Consult Note (Signed)
.  rxANTICOAGULATION CONSULT NOTE - Initial Consult  Pharmacy Consult for heparin infusion  Indication: atrial fibrillation  No Known Allergies  Patient Measurements: Height: 5\' 11"  (180.3 cm) Weight: 93 kg (205 lb) IBW/kg (Calculated) : 75.3 Heparin Dosing Weight: 93kg  Vital Signs: Temp: 98.5 F (36.9 C) (07/22 2010) Temp Source: Oral (07/22 2010) BP: 108/78 (07/22 2130) Pulse Rate: 100 (07/22 2130)  Labs: Recent Labs    12/14/22 2028  HGB 14.8  HCT 43.6  PLT 142*  APTT 27  LABPROT 13.9  INR 1.1  CREATININE 1.00  TROPONINIHS 3    Estimated Creatinine Clearance: 83.5 mL/min (by C-G formula based on SCr of 1 mg/dL).   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 He also reports feeling SOB and lightheaded since onset of symptoms. Pt had a stroke on May 6th and had a PFO closure last week at [facility]. Pt denies a fib. PMH includes CVA, PFO, AAA.   Goal of Therapy:  Heparin level 0.3-0.7 units/ml Monitor platelets by anticoagulation protocol: Yes   Plan:  Give 5000 units bolus x 1 Heparin infusion at 1400 units/hr  Heparin level to be done in approximately 6hrs for initial review, then daily and as indicated. Daily CBC.   Rico Junker, RPh 12/14/2022,10:03 PM

## 2022-12-14 NOTE — ED Notes (Signed)
EKG shows a fib with RVR. Pt taken directly to treatment room and EKG given to EDP.

## 2022-12-14 NOTE — ED Triage Notes (Signed)
Pt via POV c/o palpitations with widely varied HR since around 3pm this afternoon. He also reports feeling SOB and lightheaded since onset of symptoms. Pt had a stroke on May 6th and had a PFO closure last week at [facility]. Pt denies a fib. PMH includes CVA, PFO, AAA.

## 2022-12-14 NOTE — ED Provider Notes (Signed)
[facility] EMERGENCY DEPARTMENT AT [facility] Provider Note   CSN: [id number] Arrival date & time: 12/14/22  1940     History {Add pertinent medical, surgical, social history, OB history to HPI:1} Chief Complaint  Patient presents with   Palpitations   Chest Pain    Marvin Harvey is a 67 y.o. male.  Patient had a recent patent foramen ovale fixed.  He started with irregular rapid heart rate today   Palpitations Associated symptoms: chest pain   Chest Pain Associated symptoms: palpitations        Home Medications Prior to Admission medications   Medication Sig Start Date End Date Taking? Authorizing Provider  allopurinol (ZYLOPRIM) 300 MG tablet Take 450 mg by mouth every evening.  11/01/17   [provider]  aspirin EC 81 MG tablet Take 81 mg by mouth in the morning.    [provider]  atorvastatin (LIPITOR) 40 MG tablet Take 40 mg by mouth at bedtime.    [provider]  clopidogrel (PLAVIX) 75 MG tablet Take 1 tablet (75 mg total) by mouth daily. 12/04/22 12/04/23  [name], MD  Coenzyme Q10 100 MG TABS Take 100 mg by mouth in the morning.    [provider]  levocetirizine (XYZAL) 5 MG tablet Take 5 mg by mouth daily as needed for allergies.    [provider]  LEXAPRO 10 MG tablet Take 10 mg by mouth in the morning. 10/15/22   [provider]  Soft Lens Products (SENSITIVE EYES SALINE) SOLN Place 1-2 drops into both eyes 3 (three) times daily as needed (eye irritation.).    [provider]  triamcinolone (NASACORT ALLERGY 24HR) 55 MCG/ACT AERO nasal inhaler Place 1 spray into the nose daily as needed (congestion/allergies.).    [provider]      Allergies    Patient has no known allergies.    Review of Systems   Review of Systems  Cardiovascular:  Positive for chest pain and palpitations.    Physical Exam Updated Vital Signs BP 105/73   Pulse 73   Temp 98.5 F (36.9 C)  (Oral)   Resp 17   Ht 5\' 11"  (1.803 m)   Wt 93 kg   SpO2 96%   BMI 28.59 kg/m  Physical Exam  ED Results / Procedures / Treatments   Labs (all labs ordered are listed, but only abnormal results are displayed) Labs Reviewed  BASIC METABOLIC PANEL - Abnormal; Notable for the following components:      Result Value   Calcium 8.8 (*)    All other components within normal limits  CBC - Abnormal; Notable for the following components:   Platelets 142 (*)    All other components within normal limits  PROTIME-INR  APTT  HEPARIN LEVEL (UNFRACTIONATED)  CBC  TROPONIN I (HIGH SENSITIVITY)  TROPONIN I (HIGH SENSITIVITY)    EKG None  Radiology DG Chest Port 1 View  Result Date: 12/14/2022 CLINICAL DATA:  5107. Atrial fibrillation. Shortness of breath and lightheadedness. PFO closure procedure last week at [facility]. EXAM: PORTABLE CHEST 1 VIEW COMPARISON:  PA Lat 01/25/2018 FINDINGS: Heart size and vasculature are normal aside from mild aortic tortuosity and calcification. Metallic PFO closure material is faintly visible. The lungs are clear. The sulci are sharp. Mild degenerative change thoracic spine. IMPRESSION: 1. No active disease. 2. Aortic atherosclerosis. 3. Metallic PFO closure material. Electronically Signed   By: Almira Bar M.D.   On: 12/14/2022 20:53  Procedures Procedures  {Document cardiac monitor, telemetry assessment procedure when appropriate:1}  Medications Ordered in ED Medications  heparin ADULT infusion 100 units/mL (25000 units/258mL) (1,400 Units/hr Intravenous New Bag/Given 12/14/22 2128)  sodium chloride 0.9 % bolus 500 mL (0 mLs Intravenous Stopped 12/14/22 2133)  metoprolol tartrate (LOPRESSOR) tablet 12.5 mg (12.5 mg Oral Given 12/14/22 2058)  heparin bolus via infusion 5,000 Units (5,000 Units Intravenous Bolus from Bag 12/14/22 2128)    ED Course/ Medical Decision Making/ A&P CRITICAL CARE Performed by: Bethann Berkshire Total critical care time: 45  minutes Critical care time was exclusive of separately billable procedures and treating other patients. Critical care was necessary to treat or prevent imminent or life-threatening deterioration. Critical care was time spent personally by me on the following activities: development of treatment plan with patient and/or surrogate as well as nursing, discussions with consultants, evaluation of patient's response to treatment, examination of patient, obtaining history from patient or surrogate, ordering and performing treatments and interventions, ordering and review of laboratory studies, ordering and review of radiographic studies, pulse oximetry and re-evaluation of patient's condition.   Patient with rapid atrial fibs.  I spoke with cardiology and they want him to take Lopressor 12 and half milligrams every 6 hours p.o.  Also started him on heparin and cardiology will consult tomorrow Click here for ABCD2, HEART and other calculatorsREFRESH Note before signing :1}                          Medical Decision Making Amount and/or Complexity of Data Reviewed Labs: ordered. Radiology: ordered.  Risk Prescription drug management. Decision regarding hospitalization.   New onset rapid A-fib  {Document critical care time when appropriate:1} {Document review of labs and clinical decision tools ie heart score, Chads2Vasc2 etc:1}  {Document your independent review of radiology images, and any outside records:1} {Document your discussion with family members, caretakers, and with consultants:1} {Document social determinants of health affecting pt's care:1} {Document your decision making why or why not admission, treatments were needed:1} Final Clinical Impression(s) / ED Diagnoses Final diagnoses:  Atrial fibrillation with rapid ventricular response (HCC)    Rx / DC Orders ED Discharge Orders     None

## 2022-12-15 ENCOUNTER — Observation Stay (HOSPITAL_COMMUNITY): Payer: Medicare Other

## 2022-12-15 DIAGNOSIS — Z8673 Personal history of transient ischemic attack (TIA), and cerebral infarction without residual deficits: Secondary | ICD-10-CM | POA: Diagnosis not present

## 2022-12-15 DIAGNOSIS — Z7982 Long term (current) use of aspirin: Secondary | ICD-10-CM | POA: Diagnosis not present

## 2022-12-15 DIAGNOSIS — I4891 Unspecified atrial fibrillation: Secondary | ICD-10-CM

## 2022-12-15 DIAGNOSIS — I719 Aortic aneurysm of unspecified site, without rupture: Secondary | ICD-10-CM | POA: Diagnosis not present

## 2022-12-15 DIAGNOSIS — Q2112 Patent foramen ovale: Secondary | ICD-10-CM | POA: Diagnosis not present

## 2022-12-15 DIAGNOSIS — Z79899 Other long term (current) drug therapy: Secondary | ICD-10-CM | POA: Diagnosis not present

## 2022-12-15 DIAGNOSIS — Z7902 Long term (current) use of antithrombotics/antiplatelets: Secondary | ICD-10-CM | POA: Diagnosis not present

## 2022-12-15 DIAGNOSIS — R002 Palpitations: Secondary | ICD-10-CM | POA: Diagnosis present

## 2022-12-15 DIAGNOSIS — I480 Paroxysmal atrial fibrillation: Secondary | ICD-10-CM | POA: Diagnosis not present

## 2022-12-15 DIAGNOSIS — H269 Unspecified cataract: Secondary | ICD-10-CM | POA: Diagnosis not present

## 2022-12-15 DIAGNOSIS — E782 Mixed hyperlipidemia: Secondary | ICD-10-CM | POA: Insufficient documentation

## 2022-12-15 DIAGNOSIS — Z8774 Personal history of (corrected) congenital malformations of heart and circulatory system: Secondary | ICD-10-CM | POA: Diagnosis not present

## 2022-12-15 DIAGNOSIS — Z8249 Family history of ischemic heart disease and other diseases of the circulatory system: Secondary | ICD-10-CM | POA: Diagnosis not present

## 2022-12-15 DIAGNOSIS — R000 Tachycardia, unspecified: Secondary | ICD-10-CM | POA: Diagnosis not present

## 2022-12-15 DIAGNOSIS — G4733 Obstructive sleep apnea (adult) (pediatric): Secondary | ICD-10-CM | POA: Diagnosis not present

## 2022-12-15 DIAGNOSIS — K219 Gastro-esophageal reflux disease without esophagitis: Secondary | ICD-10-CM | POA: Diagnosis not present

## 2022-12-15 DIAGNOSIS — M1990 Unspecified osteoarthritis, unspecified site: Secondary | ICD-10-CM | POA: Diagnosis not present

## 2022-12-15 LAB — COMPREHENSIVE METABOLIC PANEL
ALT: 32 U/L (ref 0–44)
AST: 25 U/L (ref 15–41)
Albumin: 3.6 g/dL (ref 3.5–5.0)
Alkaline Phosphatase: 86 U/L (ref 38–126)
Anion gap: 7 (ref 5–15)
BUN: 14 mg/dL (ref 8–23)
CO2: 27 mmol/L (ref 22–32)
Calcium: 9 mg/dL (ref 8.9–10.3)
Chloride: 107 mmol/L (ref 98–111)
Creatinine, Ser: 1.01 mg/dL (ref 0.61–1.24)
GFR, Estimated: >60 mL/min (ref >60)
Glucose, Bld: 92 mg/dL (ref 70–99)
Potassium: 4 mmol/L (ref 3.5–5.1)
Sodium: 141 mmol/L (ref 135–145)
Total Bilirubin: 1 mg/dL (ref 0.3–1.2)
Total Protein: 6 g/dL — ABNORMAL LOW (ref 6.5–8.1)

## 2022-12-15 LAB — CBC WITH DIFFERENTIAL/PLATELET
Abs Immature Granulocytes: 0.03 10*3/uL (ref 0.00–0.07)
Basophils Absolute: 0.1 10*3/uL (ref 0.0–0.1)
Basophils Relative: 1 %
Eosinophils Absolute: 0.2 10*3/uL (ref 0.0–0.5)
Eosinophils Relative: 3 %
HCT: 41.3 % (ref 39.0–52.0)
Hemoglobin: 14.1 g/dL (ref 13.0–17.0)
Immature Granulocytes: 1 %
Lymphocytes Relative: 28 %
Lymphs Abs: 1.7 10*3/uL (ref 0.7–4.0)
MCH: 30.5 pg (ref 26.0–34.0)
MCHC: 34.1 g/dL (ref 30.0–36.0)
MCV: 89.4 fL (ref 80.0–100.0)
Monocytes Absolute: 0.7 10*3/uL (ref 0.1–1.0)
Monocytes Relative: 11 %
Neutro Abs: 3.4 10*3/uL (ref 1.7–7.7)
Neutrophils Relative %: 56 %
Platelets: 140 10*3/uL — ABNORMAL LOW (ref 150–400)
RBC: 4.62 MIL/uL (ref 4.22–5.81)
RDW: 13.9 % (ref 11.5–15.5)
WBC: 6 10*3/uL (ref 4.0–10.5)
nRBC: 0 % (ref 0.0–0.2)

## 2022-12-15 LAB — HEPARIN LEVEL (UNFRACTIONATED)
Heparin Unfractionated: 0.39 IU/mL (ref 0.30–0.70)
Heparin Unfractionated: 0.45 IU/mL (ref 0.30–0.70)

## 2022-12-15 LAB — HIV ANTIBODY (ROUTINE TESTING W REFLEX): HIV Screen 4th Generation wRfx: NONREACTIVE

## 2022-12-15 LAB — LIPID PANEL
Cholesterol: 91 mg/dL (ref 0–200)
HDL: 43 mg/dL (ref >40)
LDL Cholesterol: 40 mg/dL (ref 0–99)
Total CHOL/HDL Ratio: 2.1 RATIO
Triglycerides: 39 mg/dL (ref <150)
VLDL: 8 mg/dL (ref 0–40)

## 2022-12-15 LAB — TSH: TSH: 1.953 u[IU]/mL (ref 0.350–4.500)

## 2022-12-15 LAB — MAGNESIUM: Magnesium: 2.1 mg/dL (ref 1.7–2.4)

## 2022-12-15 MED ORDER — ASPIRIN 81 MG PO TBEC
81.0000 mg | DELAYED_RELEASE_TABLET | Freq: Every day | ORAL | Status: DC
  Administered 2022-12-15: 81 mg via ORAL
  Filled 2022-12-15: qty 1

## 2022-12-15 MED ORDER — ATORVASTATIN CALCIUM 40 MG PO TABS
40.0000 mg | ORAL_TABLET | Freq: Every day | ORAL | Status: DC
  Administered 2022-12-15 – 2022-12-16 (×3): 40 mg via ORAL
  Filled 2022-12-15 (×3): qty 1

## 2022-12-15 MED ORDER — METOPROLOL TARTRATE 25 MG PO TABS
25.0000 mg | ORAL_TABLET | Freq: Two times a day (BID) | ORAL | Status: DC
  Administered 2022-12-15 – 2022-12-16 (×2): 25 mg via ORAL
  Filled 2022-12-15 (×2): qty 1

## 2022-12-15 MED ORDER — ACETAMINOPHEN 325 MG PO TABS: 650.0000 mg | ORAL_TABLET | Freq: Four times a day (QID) | ORAL | Status: DC | PRN

## 2022-12-15 MED ORDER — ONDANSETRON HCL 4 MG PO TABS: 4.0000 mg | ORAL_TABLET | Freq: Four times a day (QID) | ORAL | Status: DC | PRN

## 2022-12-15 MED ORDER — METOPROLOL TARTRATE 25 MG PO TABS
12.5000 mg | ORAL_TABLET | Freq: Four times a day (QID) | ORAL | Status: DC
  Administered 2022-12-15 (×2): 12.5 mg via ORAL
  Filled 2022-12-15 (×2): qty 1

## 2022-12-15 MED ORDER — ONDANSETRON HCL 4 MG/2ML IJ SOLN: 4.0000 mg | Freq: Four times a day (QID) | INTRAMUSCULAR | Status: DC | PRN

## 2022-12-15 MED ORDER — OXYCODONE HCL 5 MG PO TABS: 5.0000 mg | ORAL_TABLET | ORAL | Status: DC | PRN

## 2022-12-15 MED ORDER — ACETAMINOPHEN 650 MG RE SUPP: 650.0000 mg | Freq: Four times a day (QID) | RECTAL | Status: DC | PRN

## 2022-12-15 MED ORDER — CLOPIDOGREL BISULFATE 75 MG PO TABS
75.0000 mg | ORAL_TABLET | Freq: Every day | ORAL | Status: DC
  Administered 2022-12-15: 75 mg via ORAL
  Filled 2022-12-15: qty 1

## 2022-12-15 MED ORDER — ESCITALOPRAM OXALATE 10 MG PO TABS
10.0000 mg | ORAL_TABLET | Freq: Every morning | ORAL | Status: DC
  Administered 2022-12-15 – 2022-12-17 (×3): 10 mg via ORAL
  Filled 2022-12-15 (×3): qty 1

## 2022-12-15 NOTE — Consult Note (Signed)
.  rxANTICOAGULATION CONSULT NOTE - Initial Consult  Pharmacy Consult for heparin infusion  Indication: atrial fibrillation  No Known Allergies  Patient Measurements: Height: 5\' 11"  (180.3 cm) Weight: 96.2 kg (212 lb 1.3 oz) IBW/kg (Calculated) : 75.3 Heparin Dosing Weight: 93kg  Vital Signs: Temp: 97.9 F (36.6 C) (07/23 0406) Temp Source: Oral (07/23 0406) BP: 101/69 (07/23 0406) Pulse Rate: 82 (07/23 0406)  Labs: Recent Labs    12/14/22 2028 12/14/22 2218 12/15/22 0336  HGB 14.8  --  14.1  HCT 43.6  --  41.3  PLT 142*  --  140*  APTT 27  --   --   LABPROT 13.9  --   --   INR 1.1  --   --   HEPARINUNFRC  --   --  0.39  CREATININE 1.00  --  1.01  TROPONINIHS 3 4  --     Estimated Creatinine Clearance: 84 mL/min (by C-G formula based on SCr of 1.01 mg/dL).   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 He also reports feeling SOB and lightheaded since onset of symptoms. Pt had a stroke on May 6th and had a PFO closure last week at [facility]. Pt denies Afib. PMH includes CVA, PFO, AAA.   Goal of Therapy:  Heparin level 0.3-0.7 units/ml Monitor platelets by anticoagulation protocol: Yes   Plan: Date Time aPTT/HL Rate/Comment 7/23 0336 0.39  Therapeutic x 1     PLAN: Continue heparin infusion at 1400 units/hour. Check HL and/or aPTT in 6 hours. Continue to monitor CBC daily while on heparin infusion.   Will M. Dareen Piano, PharmD Clinical  Pharmacist 12/15/2022 7:43 AM

## 2022-12-15 NOTE — Progress Notes (Addendum)
Patient bought up to the floor at around midnight. Alert and oriented x4. Patient spouse at bedside. No complaints of pain or discomfort at this time. NPO except sips with meds since arrival to the floor. Cardiology to consult with patient today. Plan of care ongoing.

## 2022-12-15 NOTE — Consult Note (Addendum)
Cardiology Consultation   Patient ID: Linken Mcglothen MRN: [id number]; DOB: [birth year]  Admit date: 12/14/2022 Date of Consult: 12/15/2022  PCP:  Jonathon Bellows, DO   [facility] Providers Cardiologist:  Nanetta Batty, MD        Patient Profile:   Jarious Lyon is a 67 y.o. 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 Flossie Dibble.  History of Present Illness:   Mr. Wenzler had a CVA 09/28/22 treated with ASA and plavix for 21 days then ASA alone. He wore a 2 week Zio but unavailable to [place] but patient says it showed no Afib just NSR with some sinus tachycardia. He underwent PFO closure by Dr. Excell Seltzer 12/04/22.  Patient now presents with palpitations and found to be in Afib. 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 Yesterday while working in the yard his heart started racing and became SOB so came in. He was on metoprolol years ago and stopped b/c it slowed his HR too much.    Past Medical History:  Diagnosis Date   Allergy    SEASONAL   Aortic root aneurysm (HCC) 01/11/2018   4.7 centimeters at sinuses of Valsalva, 4.2 cm mid ascending   Arthritis    Cataract    BILATERAL   GERD (gastroesophageal reflux disease)    History of repair of hiatal hernia 2001   Sleep apnea    On CPAP    Past Surgical History:  Procedure Laterality Date   CLOSED REDUCTION HAND FRACTURE Right    COLONOSCOPY     LEFT HEART CATH AND CORS/GRAFTS ANGIOGRAPHY N/A 01/26/2018   Procedure: LEFT HEART CATH AND CORS/GRAFTS ANGIOGRAPHY;  Surgeon: Runell Gess, MD;  Location: [facility] INVASIVE CV LAB;  Service: Cardiovascular;  Laterality: N/A;   NISSEN FUNDOPLICATION  2004   Complete in [place], [place]   PATENT FORAMEN OVALE(PFO) CLOSURE N/A  12/04/2022   Procedure: PATENT FORAMEN OVALE(PFO) CLOSURE;  Surgeon: [name], MD;  Location: [facility] INVASIVE CV LAB;  Service: Cardiovascular;  Laterality: N/A;   POLYPECTOMY     TEE WITHOUT CARDIOVERSION N/A 10/26/2022   Procedure: TRANSESOPHAGEAL ECHOCARDIOGRAM;  Surgeon: Christell Constant, MD;  Location: [facility] INVASIVE CV LAB;  Service: Cardiovascular;  Laterality: N/A;     Home Medications:  Prior to Admission medications   Medication Sig Start Date End Date Taking? Authorizing Provider  allopurinol (ZYLOPRIM) 300 MG tablet Take 450 mg by mouth every evening.  11/01/17   [provider]  aspirin EC 81 MG tablet Take 81 mg by mouth in the morning.    [provider]  atorvastatin (LIPITOR) 40 MG tablet Take 40 mg by mouth at bedtime.    [provider]  clopidogrel (PLAVIX) 75 MG tablet Take 1 tablet (75 mg total) by mouth daily. 12/04/22 12/04/23  [name], MD  Coenzyme Q10 100 MG TABS Take 100 mg by mouth in the morning.    [provider]  levocetirizine (XYZAL) 5 MG tablet Take 5 mg by mouth daily as needed for allergies.    [provider]  LEXAPRO 10 MG tablet Take 10 mg by mouth in the morning. 10/15/22   [provider]  Soft Lens Products (SENSITIVE EYES SALINE)  SOLN Place 1-2 drops into both eyes 3 (three) times daily as needed (eye irritation.).    [provider]  triamcinolone (NASACORT ALLERGY 24HR) 55 MCG/ACT AERO nasal inhaler Place 1 spray into the nose daily as needed (congestion/allergies.).    [provider]    Inpatient Medications: Scheduled Meds:  aspirin EC  81 mg Oral Daily   atorvastatin  40 mg Oral QHS   clopidogrel  75 mg Oral Daily   escitalopram  10 mg Oral q AM   metoprolol tartrate  12.5 mg Oral Q6H   Continuous Infusions:  heparin 1,400 Units/hr (12/15/22 0801)   PRN Meds: acetaminophen **OR** acetaminophen, ondansetron **OR** ondansetron (ZOFRAN) IV,  oxyCODONE  Allergies:   No Known Allergies  Social History:   Social History   Socioeconomic History   Marital status: Married    Spouse name: Not on file   Number of children: Not on file   Years of education: Not on file   Highest education level: Not on file  Occupational History   Not on file  Tobacco Use   Smoking status: Never   Smokeless tobacco: Never  Vaping Use   Vaping status: Never Used  Substance and Sexual Activity   Alcohol use: Never   Drug use: Never   Sexual activity: Not on file  Other Topics Concern   Not on file  Social History Narrative   Are you right handed or left handed? Right   Are you currently employed ? Yes   What is your current occupation? [name]   Do you live at home alone? Yes   Who lives with you?  Wife   What type of home do you live in: 1 story or 2 story? 2       Social Determinants of Health   Financial Resource Strain: Not on file  Food Insecurity: No Food Insecurity (12/15/2022)   Hunger Vital Sign    Worried About Running Out of Food in the Last Year: Never true    Ran Out of Food in the Last Year: Never true  Transportation Needs: No Transportation Needs (12/15/2022)   PRAPARE - Administrator, Civil Service (Medical): No    Lack of Transportation (Non-Medical): No  Physical Activity: Not on file  Stress: Not on file  Social Connections: Not on file  Intimate Partner Violence: Not At Risk (12/15/2022)   Humiliation, Afraid, Rape, and Kick questionnaire    Fear of Current or Ex-Partner: No    Emotionally Abused: No    Physically Abused: No    Sexually Abused: No    Family History:     Family History  Problem Relation Age of Onset   Atrial fibrillation Mother    Transient ischemic attack Mother    Colon polyps Father    Coronary artery disease Father        s/p CABGx3, Valve replacement   Aortic aneurysm Father        repaired   Colon cancer Neg Hx    Esophageal cancer Neg Hx    Rectal cancer Neg Hx     Stomach cancer Neg Hx    Liver disease Neg Hx      ROS:  Please see the history of present illness.  Review of Systems  Constitutional: Negative.  HENT: Negative.    Cardiovascular:  Positive for dyspnea on exertion, irregular heartbeat and palpitations.  Respiratory: Negative.    Endocrine: Negative.   Hematologic/Lymphatic: Negative.   Musculoskeletal: Negative.  Gastrointestinal: Negative.   Genitourinary: Negative.   Neurological: Negative.     All other ROS reviewed and negative.     Physical Exam/Data:   Vitals:   12/14/22 2345 12/15/22 0008 12/15/22 0010 12/15/22 0406  BP: 99/65 100/71 100/71 101/69  Pulse: 78 79 79 82  Resp: 20 20    Temp:  98.4 F (36.9 C) 98.4 F (36.9 C) 97.9 F (36.6 C)  TempSrc:   Oral Oral  SpO2: 96% 95% 95% 97%  Weight:  96.2 kg    Height:        Intake/Output Summary (Last 24 hours) at 12/15/2022 0927 Last data filed at 12/15/2022 0801 Gross per 24 hour  Intake 696.86 ml  Output --  Net 696.86 ml      12/15/2022   12:08 AM 12/14/2022    8:11 PM 12/04/2022    6:08 AM  Last 3 Weights  Weight (lbs) 212 lb 1.3 oz 205 lb 205 lb  Weight (kg) 96.2 kg 92.987 kg 92.987 kg     Body mass index is 29.58 kg/m.  General:  Well nourished, well developed, in no acute distress  HEENT: normal Neck: no JVD Vascular: No carotid bruits; Distal pulses 2+ bilaterally Cardiac:  normal S1, S2; irreg; no murmur   Lungs:  clear to auscultation bilaterally, no wheezing, rhonchi or rales  Abd: soft, nontender, no hepatomegaly  Ext: no edema Musculoskeletal:  No deformities, BUE and BLE strength normal and equal Skin: warm and dry  Neuro:  CNs 2-12 intact, no focal abnormalities noted Psych:  Normal affect   EKG:  The EKG was personally reviewed and demonstrates:  Afib 114/m  Telemetry:  Telemetry was personally reviewed and demonstrates:  Afib 80-90's  Relevant CV Studies:   See recent echos in chart Laboratory Data:  High Sensitivity  Troponin:   Recent Labs  Lab 12/14/22 2028 12/14/22 2218  TROPONINIHS 3 4     Chemistry Recent Labs  Lab 12/14/22 2028 12/15/22 0336  NA 137 141  K 3.7 4.0  CL 104 107  CO2 25 27  GLUCOSE 89 92  BUN 15 14  CREATININE 1.00 1.01  CALCIUM 8.8* 9.0  MG  --  2.1  GFRNONAA >60 >60  ANIONGAP 8 7    Recent Labs  Lab 12/15/22 0336  PROT 6.0*  ALBUMIN 3.6  AST 25  ALT 32  ALKPHOS 86  BILITOT 1.0   Lipids  Recent Labs  Lab 12/15/22 0336  CHOL 91  TRIG 39  HDL 43  LDLCALC 40  CHOLHDL 2.1    Hematology Recent Labs  Lab 12/14/22 2028 12/15/22 0336  WBC 7.1 6.0  RBC 4.96 4.62  HGB 14.8 14.1  HCT 43.6 41.3  MCV 87.9 89.4  MCH 29.8 30.5  MCHC 33.9 34.1  RDW 14.1 13.9  PLT 142* 140*   Thyroid  Recent Labs  Lab 12/14/22 2218  TSH 1.953    BNPNo results for input(s): "BNP", "PROBNP" in the last 168 hours.  DDimer No results for input(s): "DDIMER" in the last 168 hours.   Radiology/Studies:  DG Chest Port 1 View  Result Date: 12/14/2022 CLINICAL DATA:  5107. Atrial fibrillation. Shortness of breath and lightheadedness. PFO closure procedure last week at [facility]. EXAM: PORTABLE CHEST 1 VIEW COMPARISON:  PA Lat 01/25/2018 FINDINGS: Heart size and vasculature are normal aside from mild aortic tortuosity and calcification. Metallic PFO closure material is faintly visible. The lungs are clear. The sulci are sharp. Mild degenerative change thoracic  spine. IMPRESSION: 1. No active disease. 2. Aortic atherosclerosis. 3. Metallic PFO closure material. Electronically Signed   By: Almira Bar M.D.   On: 12/14/2022 20:53     Assessment and Plan:   Afib with controlled rate new diagnosis for patient. Currently on ASA for CVA, ASA and plavix for PFO closure for 6 months and now will need eliquis.  On metoprolol 12.5 mg q6 hrs-change to 25 mg bid. Patient concerned about the cost of eliquis and can see if he can get patient assistance. With recent CVA/PFO closure probably  need to hold off on DCCV. Suspect he will go in/out Afib as recent Zio didn't show it. Will discuss with Dr. Wyline Mood. Can eat, and no need for repeat echo today.  PFO closure 12/04/22-plan for ASA 81 mg daily and Plavix 75 mg daily for 6 months. SBE prophylaxis for 6 months.  CVA 09/28/22  Aortic root aneurysm/ascending thoracic aorta 4.2 cm 10/13/22 CTA  OSA on CPAP   Risk Assessment/Risk Scores:          CHA2DS2-VASc Score = 3   This indicates a 3.2% annual risk of stroke. The patient's score is based upon: CHF History: 0 HTN History: 0 Diabetes History: 0 Stroke History: 2 Vascular Disease History: 0 Age Score: 1 Gender Score: 0         For questions or updates, please contact [facility] Please consult [email or web address] for contact info under    Signed, Jacolyn Reedy, PA-C  12/15/2022 9:27 AM  Attending note Patient seen and discussed with PA Geni Bers, I agree with her documentation. 67 yo male history of CVA thought to be cardioembolic now s/p PFO closure 12/04/22, aortic aneurysm being monitored by CT surgery,  presented with palpitations and chest pain. In ER found to be in afib, new diagnosis for patient.      K 3.7 Cr 1 BUN 15 WBC 7.1 Hgb 14.8 Plt 142 TSH 1.9 Trop 3-->4 CXR no acute process 11/2022 echo: stable PFO closure without residual shunt,  10/2022 TEE: LVEF 55-60%, mild MR, +PFO  Assessment and plan  Afib - new diagnosis this admission - from review there is a link between percutanous PFO closure and new onset of afib, occurs <5% of patients with peak around 14 days after procedure - started on lopressor 12.5mg  every 6 hrs, consolidate to 25mg  bid - CHADS2Vasc score is at least 3, started on heparin gtt. Transition to DOAC tomorrow - would warrant in the future outpatient monitor to see if afib isolated to post procedure period or longer term issue.  - follow bp's and rates today, likely ambulate in AM see how he does symptom and rate wise.    2.PFO closure - procedure was done 12/03/12 - recs were for ASA/plavix x 6 months. Clarify with structural heart ok to transition to eliquis alone.  - discussed with Dr Excell Seltzer, from PFO device ok to treat with just eliquis can d/c ASA/plavix  Dina Rich MD

## 2022-12-15 NOTE — Progress Notes (Signed)
Per PA exam on hold.

## 2022-12-15 NOTE — H&P (Signed)
History and Physical    Patient: Marvin Harvey GEX:[id number] DOB: Jul 19, 1955 DOA: 12/14/2022 DOS: the patient was seen and examined on 12/15/2022 PCP: Jonathon Bellows, DO  Patient coming from: Home  Chief Complaint:  Chief Complaint  Patient presents with   Palpitations   Chest Pain   HPI: Marvin Harvey is a 67 y.o. male with medical history significant of CVA, GERD, hyperlipidemia, and more presents the ER with a chief complaint of palpitations and chest pain.  Patient recently had PFO repair on July 12.  He reports he has felt generally drained since then.  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  He felt exhausted after that, but had no chest pain or palpitations.  On the a.m. of presentation he woke up with palpitations, then at 3:00 in the afternoon they became more intense and he had chest pain.  He describes the chest pain as substernal and pressure-like.  There was intermittent short spurts of sharp pain but it was mostly pressure.  Patient reports he had some very light exertion out in his yard which made it worse.  He was short of breath with exertion.  He did not feel like he was going to pass out.  His symptoms are constant.  When he came into the ER he was found to be in atrial fibrillation.  Patient denies any cough, fever, dysuria, hematuria, hematochezia, melena, previous episodes of chest pain since his procedure, dizziness, near syncope, and has no other complaints.  Patient does not smoke, does not drink.  He did not get vaccinated for COVID or flu.  Patient is full code. Review of Systems: As mentioned in the history of present illness. All other systems reviewed and are negative. Past Medical History:  Diagnosis Date   Allergy    SEASONAL   Aortic root aneurysm (HCC) 01/11/2018   4.7 centimeters at sinuses of Valsalva, 4.2 cm mid ascending   Arthritis    Cataract    BILATERAL   GERD (gastroesophageal reflux disease)    History of  repair of hiatal hernia 2001   Sleep apnea    On CPAP   Past Surgical History:  Procedure Laterality Date   CLOSED REDUCTION HAND FRACTURE Right    COLONOSCOPY     LEFT HEART CATH AND CORS/GRAFTS ANGIOGRAPHY N/A 01/26/2018   Procedure: LEFT HEART CATH AND CORS/GRAFTS ANGIOGRAPHY;  Surgeon: Runell Gess, MD;  Location: [facility] INVASIVE CV LAB;  Service: Cardiovascular;  Laterality: N/A;   NISSEN FUNDOPLICATION  2004   Complete in [place], [place]   PATENT FORAMEN OVALE(PFO) CLOSURE N/A 12/04/2022   Procedure: PATENT FORAMEN OVALE(PFO) CLOSURE;  Surgeon: [name], MD;  [facility] INVASIVE CV LAB;  Service: Cardiovascular;  Laterality: N/A;   POLYPECTOMY     TEE WITHOUT CARDIOVERSION N/A 10/26/2022   Procedure: TRANSESOPHAGEAL ECHOCARDIOGRAM;  Surgeon: Christell Constant, MD;  Location: [facility] INVASIVE CV LAB;  Service: Cardiovascular;  Laterality: N/A;   Social History:  reports that he has never smoked. He has never used smokeless tobacco. He reports that he does not drink alcohol and does not use drugs.  No Known Allergies  Family History  Problem Relation Age of Onset   Atrial fibrillation Mother    Transient ischemic attack Mother    Colon polyps Father    Coronary artery disease Father        s/p CABGx3, Valve replacement   Aortic aneurysm Father  repaired   Colon cancer Neg Hx    Esophageal cancer Neg Hx    Rectal cancer Neg Hx    Stomach cancer Neg Hx    Liver disease Neg Hx     Prior to Admission medications   Medication Sig Start Date End Date Taking? Authorizing Provider  allopurinol (ZYLOPRIM) 300 MG tablet Take 450 mg by mouth every evening.  11/01/17   [provider]  aspirin EC 81 MG tablet Take 81 mg by mouth in the morning.    [provider]  atorvastatin (LIPITOR) 40 MG tablet Take 40 mg by mouth at bedtime.    [provider]  clopidogrel (PLAVIX) 75 MG tablet Take 1 tablet (75 mg total) by mouth daily. 12/04/22 12/04/23   [name], MD  Coenzyme Q10 100 MG TABS Take 100 mg by mouth in the morning.    [provider]  levocetirizine (XYZAL) 5 MG tablet Take 5 mg by mouth daily as needed for allergies.    [provider]  LEXAPRO 10 MG tablet Take 10 mg by mouth in the morning. 10/15/22   [provider]  Soft Lens Products (SENSITIVE EYES SALINE) SOLN Place 1-2 drops into both eyes 3 (three) times daily as needed (eye irritation.).    [provider]  triamcinolone (NASACORT ALLERGY 24HR) 55 MCG/ACT AERO nasal inhaler Place 1 spray into the nose daily as needed (congestion/allergies.).    [provider]    Physical Exam: Vitals:   12/14/22 2345 12/15/22 0008 12/15/22 0010 12/15/22 0406  BP: 99/65 100/71 100/71 101/69  Pulse: 78 79 79 82  Resp: 20 20    Temp:  98.4 F (36.9 C) 98.4 F (36.9 C) 97.9 F (36.6 C)  TempSrc:   Oral Oral  SpO2: 96% 95% 95% 97%  Weight:  96.2 kg    Height:       1.  General: Patient lying supine in bed,  no acute distress   2. Psychiatric: Alert and oriented x 3, mood and behavior normal for situation, pleasant and cooperative with exam   3. Neurologic: Speech and language are normal, face is symmetric, moves all 4 extremities voluntarily, at baseline without acute deficits on limited exam   4. HEENMT:  Head is atraumatic, normocephalic, pupils reactive to light, neck is supple, trachea is midline, mucous membranes are moist   5. Respiratory : Lungs are clear to auscultation bilaterally without wheezing, rhonchi, rales, no cyanosis, no increase in work of breathing or accessory muscle use   6. Cardiovascular : Heart rate normal, rhythm is sinus at the time of my exam, rubs or gallops, no peripheral edema, peripheral pulses palpated   7. Gastrointestinal:  Abdomen is soft, nondistended, nontender to palpation bowel sounds active, no masses or organomegaly palpated   8. Skin:  Skin is warm, dry and intact without  rashes, acute lesions, or ulcers on limited exam   9.Musculoskeletal:  No acute deformities or trauma, no asymmetry in tone, no peripheral edema, peripheral pulses palpated, no tenderness to palpation in the extremities  Data Reviewed: In the ED Temp 98.5, heart rate 83-1 23, respiratory rate 13-25, blood pressure 98/74-124/92, satting 93-98% No leukocytosis, hemoglobin stable Chemistry unremarkable Trope 3, 4 Chest x-ray shows no active process Cardiology was consulted and recommended heparin drip, Lopressor 12-1/2 mg every 6 hours, and cardiology consult in the a.m. Admission requested for new onset atrial fibrillation  Assessment and Plan: * Atrial fibrillation (HCC) - Recently had PFO repair -  Currently in A-fib - Cardiology consulted and recommended Lopressor 12.5 mg every 6 hours - Cardiology to see this a.m. - N.p.o. until cleared by cardiology - Echo in the a.m. 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 Observation status is judged to be reasonable and necessary in order to provide the required intensity of service to ensure the patient's safety. The patient's presenting symptoms, physical exam findings, and initial radiographic and laboratory data in the context of their medical condition is felt to place them at decreased risk for further clinical deterioration. Furthermore, it is anticipated that the  patient will be medically stable for discharge from the hospital within 2 midnights of admission.   Author: Lilyan Gilford, DO 12/15/2022 5:24 AM  For on call review [email or web address].

## 2022-12-15 NOTE — Assessment & Plan Note (Signed)
Continue statin. 

## 2022-12-15 NOTE — Assessment & Plan Note (Addendum)
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

## 2022-12-15 NOTE — Progress Notes (Signed)
Patient ambulated entire floor x 2 with no distress.  Starting heart rate was in upper 70s, after walk, which was approximately 4 minutes, pulse was in the 90s.  Patient was not short of breath, or had any complaints of chest pain.

## 2022-12-15 NOTE — Consult Note (Signed)
.  rxANTICOAGULATION CONSULT NOTE - Initial Consult  Pharmacy Consult for heparin infusion  Indication: atrial fibrillation  No Known Allergies  Patient Measurements: Height: 5\' 11"  (180.3 cm) Weight: 96.2 kg (212 lb 1.3 oz) IBW/kg (Calculated) : 75.3 Heparin Dosing Weight: 93kg  Vital Signs: Temp: 97.9 F (36.6 C) (07/23 0406) Temp Source: Oral (07/23 0406) BP: 101/69 (07/23 0406) Pulse Rate: 82 (07/23 0406)  Labs: Recent Labs    12/14/22 2028 12/14/22 2218 12/15/22 0336 12/15/22 0936  HGB 14.8  --  14.1  --   HCT 43.6  --  41.3  --   PLT 142*  --  140*  --   APTT 27  --   --   --   LABPROT 13.9  --   --   --   INR 1.1  --   --   --   HEPARINUNFRC  --   --  0.39 0.45  CREATININE 1.00  --  1.01  --   TROPONINIHS 3 4  --   --     Estimated Creatinine Clearance: 84 mL/min (by C-G formula based on SCr of 1.01 mg/dL).   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 He also reports feeling SOB and lightheaded since onset of symptoms. Pt had a stroke on May 6th and had a PFO closure last week at [facility]. Pt denies Afib. PMH includes CVA, PFO, AAA.   Goal of Therapy:  Heparin level 0.3-0.7 units/ml Monitor platelets by anticoagulation protocol: Yes   Plan: Date Time aPTT/HL Rate/Comment 7/23 0336 0.39  Therapeutic x 1 7/23 0936 0.45  Therapeutic x 2     PLAN: Continue heparin infusion at 1400 units/hour. Check HL with AM labs Continue to monitor CBC daily  while on heparin infusion.   Will M. Dareen Piano, PharmD Clinical Pharmacist 12/15/2022 10:53 AM

## 2022-12-15 NOTE — Assessment & Plan Note (Addendum)
-   Recently had PFO repair 12/04/2022, has been on aspirin and Plavix daily which was recommended for 6 months   - Currently in A-fib  - Cardiology consulted and recommended Lopressor 12.5 mg every 6 hours Transition to 25 mg p.o. twice daily - CHADS2Vasc score is at least 3, started on heparin gtt. Transition to DOAC tomorrow 7/24  - Dr. Wyline Mood following closely-we discussed with Dr. Excell Seltzer from PFO device okay to treat with Eliquis -can discontinue ASA/Plavix  -Recommend no need for repeat echo - On heparin drip  - Appreciate cardiology recommendations

## 2022-12-15 NOTE — Progress Notes (Signed)
PROGRESS NOTE    Patient: Marvin Harvey                            PCP: Jonathon Bellows, DO                    DOB: [birth year]            DOA: 12/14/2022 [id number]             DOS: 12/15/2022, 1:27 PM   LOS: 0 days   Date of Service: The patient was seen and examined on 12/15/2022  Subjective:   The patient was seen and examined this morning. Hemodynamically stable. Still in A-fib heart rate 82 Denies any chest pain or shortness of breath   Brief Narrative:   Jailin Moomaw is a 67 y.o. male with medical history significant of CVA, GERD, hyperlipidemia, presents the ER with a chief complaint of palpitations and chest pain.   Patient recently had PFO repair on July 12.  He reports he has felt generally drained since then.  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 On the a.m. of presentation he woke up with palpitations, then at 3:00 in the afternoon they became more intense and he had chest pain.  He describes the chest pain as substernal and pressure-like.  His symptoms are constant.  When he came into the ER he was found to be in atrial fibrillation.    Patient does not smoke, does not drink.  He did not get vaccinated for COVID or flu.  Patient is full code.    Assessment & Plan:   Principal Problem:   Atrial fibrillation (HCC) Active Problems:   History of CVA (cerebrovascular accident)   Mixed hyperlipidemia     Assessment and Plan: * Atrial fibrillation (HCC) - Recently had PFO repair 12/04/2022, has been on aspirin and Plavix daily which was recommended for 6 months   - Currently in A-fib  - Cardiology consulted and recommended Lopressor 12.5 mg every 6 hours Transition to 25 mg p.o. twice daily - CHADS2Vasc score is at least 3, started on heparin gtt. Transition to DOAC tomorrow 7/24  - Dr. Wyline Mood following closely-we discussed with Dr. 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 I agree with the assessment and plan as outlined ----------------------------------------------------------------------------------------------------------------------------------  DVT prophylaxis:  SCDs Start: 12/15/22 0030   Code Status:   Code Status: Full Code  Family Communication: Bedside updated   Admission status:   Status is: Observation The patient remains OBS appropriate and will d/c before 2 midnights.   Disposition: From  - home             Planning for discharge in 1 day to Home   Procedures:   No admission procedures for hospital encounter.   Antimicrobials:  Anti-infectives (From admission, onward)    None        Medication:   atorvastatin  40 mg  Oral QHS   escitalopram  10 mg Oral q AM   metoprolol tartrate  25 mg Oral BID    acetaminophen **OR** acetaminophen, ondansetron **OR** ondansetron (ZOFRAN) IV, oxyCODONE   Objective:   Vitals:   12/15/22 0008 12/15/22 0010 12/15/22 0406 12/15/22 1306  BP: 100/71 100/71 101/69 110/85  Pulse: 79 79 82 84  Resp: 20   17  Temp: 98.4 F (36.9 C) 98.4 F (36.9 C) 97.9 F (36.6 C) 97.6 F (36.4 C)  TempSrc:  Oral Oral Oral  SpO2: 95% 95% 97% 97%  Weight: 96.2 kg     Height:        Intake/Output Summary (Last 24 hours) at 12/15/2022 1327 Last data filed at 12/15/2022 0900 Gross per 24 hour  Intake 696.86 ml  Output --   Net 696.86 ml   Filed Weights   12/14/22 2011 12/15/22 0008  Weight: 93 kg 96.2 kg     Physical examination:   Constitution:  Alert, cooperative, no distress,  Appears calm and comfortable  Psychiatric:   Normal and stable mood and affect, cognition intact,   HEENT:        Normocephalic, PERRL, otherwise with in Normal limits  Chest:         Chest symmetric Cardio vascular: Irregularly irregular,S1, S2 no Rubs or Gallops  pulmonary: Clear to auscultation bilaterally, respirations unlabored, negative wheezes / crackles Abdomen: Soft, non-tender, non-distended, bowel sounds,no masses, no organomegaly Muscular skeletal: Limited exam - in bed, able to move all 4 extremities,   Neuro: CNII-XII intact. , normal motor and sensation, reflexes intact  Extremities: No pitting edema lower extremities, +2 pulses  Skin: Dry, warm to touch, negative for any Rashes, No open wounds    ------------------------------------------------------------------------------------------------------------------------------------------    LABs:     Latest Ref Rng & Units 12/15/2022    3:36 AM 12/14/2022    8:28 PM 11/23/2022    1:07 PM  CBC  WBC 4.0 - 10.5 K/uL 6.0  7.1  6.6   Hemoglobin 13.0 - 17.0 g/dL 86.5  78.4  69.6   Hematocrit 39.0 - 52.0 % 41.3  43.6  46.2   Platelets 150 - 400 K/uL 140  142  130       Latest Ref Rng & Units 12/15/2022    3:36 AM 12/14/2022    8:28 PM 11/23/2022    1:07 PM  CMP  Glucose 70 - 99 mg/dL 92  89  98   BUN 8 - 23 mg/dL 14  15  11    Creatinine 0.61 - 1.24 mg/dL 2.95  2.84  1.32   Sodium 135 - 145 mmol/L 141  137  139   Potassium 3.5 - 5.1 mmol/L 4.0  3.7  4.9   Chloride 98 - 111 mmol/L 107  104  102   CO2 22 - 32 mmol/L 27  25  26    Calcium 8.9 - 10.3 mg/dL 9.0  8.8  9.9   Total Protein 6.5 - 8.1 g/dL 6.0     Total Bilirubin 0.3 - 1.2 mg/dL 1.0     Alkaline Phos 38 - 126 U/L 86     AST 15 - 41 U/L 25     ALT 0 - 44 U/L 32          Micro Results No results  found for this or any previous visit (from the past 240 hour(s)).  Radiology Reports DG Chest Port 1 View  Result Date: 12/14/2022 CLINICAL DATA:  5107. Atrial fibrillation.  Shortness of breath and lightheadedness. PFO closure procedure last week at [facility]. EXAM: PORTABLE CHEST 1 VIEW COMPARISON:  PA Lat 01/25/2018 FINDINGS: Heart size and vasculature are normal aside from mild aortic tortuosity and calcification. Metallic PFO closure material is faintly visible. The lungs are clear. The sulci are sharp. Mild degenerative change thoracic spine. IMPRESSION: 1. No active disease. 2. Aortic atherosclerosis. 3. Metallic PFO closure material. Electronically Signed   By: Almira Bar M.D.   On: 12/14/2022 20:53    SIGNED: Kendell Bane, MD, FHM. FAAFP. [facility] - [facility] Time spent - 35 min.  In seeing, evaluating and examining the patient. Reviewing medical records, labs, drawn plan of care. [facility],  Pager (please use [email or web address] to page/ text) Please use Epic Secure Chat for non-urgent communication (7AM-7PM)  If 7PM-7AM, please contact night-coverage [email or web address], 12/15/2022, 1:27 PM

## 2022-12-15 NOTE — Hospital Course (Addendum)
Marvin Harvey is a 67 y.o. male with medical history significant of CVA, GERD, hyperlipidemia, presents the ER with a chief complaint of palpitations and chest pain.   Patient recently had PFO repair on July 12.  He reports he has felt generally drained since then.  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 On the a.m. of presentation he woke up with palpitations, then at 3:00 in the afternoon they became more intense and he had chest pain.  He describes the chest pain as substernal and pressure-like.  His symptoms are constant.  When he came into the ER he was found to be in atrial fibrillation.    Patient does not smoke, does not drink.  He did not get vaccinated for COVID or flu.  Patient is full code.

## 2022-12-15 NOTE — Progress Notes (Signed)
   12/15/22 1125  TOC Brief Assessment  Insurance and Status Reviewed  Patient has primary care physician Yes  Home environment has been reviewed Home with spouse  Prior level of function: independent  Prior/Current Home Services No current home services  Social Determinants of Health Reivew SDOH reviewed no interventions necessary  Readmission risk has been reviewed Yes  Transition of care needs no transition of care needs at this time   Admitted in OBS, Cardiology consulted. TOC following.

## 2022-12-16 ENCOUNTER — Other Ambulatory Visit (HOSPITAL_COMMUNITY): Payer: Self-pay

## 2022-12-16 DIAGNOSIS — E782 Mixed hyperlipidemia: Secondary | ICD-10-CM | POA: Diagnosis not present

## 2022-12-16 DIAGNOSIS — I4891 Unspecified atrial fibrillation: Secondary | ICD-10-CM | POA: Diagnosis not present

## 2022-12-16 DIAGNOSIS — I719 Aortic aneurysm of unspecified site, without rupture: Secondary | ICD-10-CM | POA: Diagnosis not present

## 2022-12-16 DIAGNOSIS — Q2112 Patent foramen ovale: Secondary | ICD-10-CM | POA: Diagnosis not present

## 2022-12-16 LAB — CBC
HCT: 45.1 % (ref 39.0–52.0)
Hemoglobin: 14.8 g/dL (ref 13.0–17.0)
MCH: 29.5 pg (ref 26.0–34.0)
MCHC: 32.8 g/dL (ref 30.0–36.0)
MCV: 90 fL (ref 80.0–100.0)
Platelets: 144 10*3/uL — ABNORMAL LOW (ref 150–400)
RBC: 5.01 MIL/uL (ref 4.22–5.81)
RDW: 14.2 % (ref 11.5–15.5)
WBC: 6.6 10*3/uL (ref 4.0–10.5)
nRBC: 0 % (ref 0.0–0.2)

## 2022-12-16 LAB — HEPARIN LEVEL (UNFRACTIONATED): Heparin Unfractionated: 0.43 IU/mL (ref 0.30–0.70)

## 2022-12-16 MED ORDER — APIXABAN 5 MG PO TABS
5.0000 mg | ORAL_TABLET | Freq: Two times a day (BID) | ORAL | Status: DC
  Administered 2022-12-16 – 2022-12-17 (×3): 5 mg via ORAL
  Filled 2022-12-16 (×3): qty 1

## 2022-12-16 MED ORDER — METOPROLOL TARTRATE 25 MG PO TABS
37.5000 mg | ORAL_TABLET | Freq: Two times a day (BID) | ORAL | Status: DC
  Administered 2022-12-16 – 2022-12-17 (×2): 37.5 mg via ORAL
  Filled 2022-12-16 (×2): qty 2

## 2022-12-16 MED ORDER — METOPROLOL TARTRATE 25 MG PO TABS
12.5000 mg | ORAL_TABLET | Freq: Once | ORAL | Status: AC
  Administered 2022-12-16: 12.5 mg via ORAL
  Filled 2022-12-16: qty 1

## 2022-12-16 NOTE — Progress Notes (Addendum)
Pt ambulated approx 100 ft on unit. No s/s of distress or SOB. Pt tolerated well.  HR resting 60 HR ambulating 70, HR did increase to 130's but quickly went back WNL.

## 2022-12-16 NOTE — Plan of Care (Signed)
  Problem: Activity: Goal: Ability to return to baseline activity level will improve Outcome: Progressing   

## 2022-12-16 NOTE — Plan of Care (Signed)
  Problem: Education: Goal: Understanding of CV disease, CV risk reduction, and recovery process will improve Outcome: Progressing   Problem: Activity: Goal: Ability to return to baseline activity level will improve Outcome: Progressing   Problem: Health Behavior/Discharge Planning: Goal: Ability to safely manage health-related needs after discharge will improve Outcome: Progressing   Problem: Education: Goal: Knowledge of General Education information will improve Description: Including pain rating scale, medication(s)/side effects and non-pharmacologic comfort measures Outcome: Progressing

## 2022-12-16 NOTE — Progress Notes (Signed)
PROGRESS NOTE  Marvin Harvey VHQ:[id number] DOB: [birth year] DOA: 12/14/2022 PCP: Jonathon Bellows, DO  Brief History:  Marvin Harvey is a 67 y.o. male with medical history significant of CVA, GERD, hyperlipidemia, presents the ER with a chief complaint of palpitations and chest pain.   Patient recently had PFO repair on July 12.  He reports he has felt generally drained since then.  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 On the a.m. of presentation he woke up with palpitations, then at 3:00 in the afternoon they became more intense and he had chest pain.  He describes the chest pain as substernal and pressure-like.  His symptoms are constant.  When he came into the ER he was found to be in atrial fibrillation.    Patient does not smoke, does not drink.  He did not get vaccinated for COVID or flu.  Patient is full code.   Assessment/Plan: New Onset atrial fibrillation, type unspecified -per cardiology there is a link between percutanous PFO closure and new onset of afib, occurs <5% of patients with peak around 14 days after procedure  -metoprolol increased ti 37.5 mg bid as pt still had tachycardia into 130s with ambulation lasting <  5 sec -initially on on IV heparin>>apixaban  .PFO closure - procedure was done 12/03/12 - recs were for ASA/plavix x 6 months.  - Dr. Derek Mound with Dr Cooper>>ok to stop DAPT and treat with just eliquis.   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   Objective: Vitals:   12/16/22 0434 12/16/22 1024 12/16/22 1135 12/16/22 1427  BP: 111/77 100/73 103/85 109/80  Pulse: 71 67 70 76  Resp:  17    Temp: (!) 97.5 F (36.4 C)   98 F (36.7 C)  TempSrc: Oral   Oral  SpO2: 99% 97% 99% 99%  Weight:      Height:        Intake/Output Summary (Last 24 hours) at 12/16/2022 1740 Last data filed at 12/16/2022 1723 Gross per 24 hour  Intake 835.8 ml  Output --  Net 835.8 ml   Weight change:  Exam:  General:  Pt is alert, follows commands appropriately, not in acute distress HEENT: No icterus, No thrush, No neck mass, [place]/AT Cardiovascular: IRRR, S1/S2, no rubs, no gallops Respiratory: CTA bilaterally, no wheezing, no crackles, no rhonchi Abdomen: Soft/+BS, non tender, non distended, no guarding Extremities: No edema, No lymphangitis, No petechiae, No rashes, no synovitis   Data Reviewed: I have personally reviewed following labs and imaging studies Basic Metabolic Panel: Recent Labs  Lab 12/14/22 2028 12/15/22 0336  NA 137 141  K 3.7 4.0  CL 104 107  CO2 25 27  GLUCOSE 89 92  BUN 15 14  CREATININE 1.00 1.01  CALCIUM 8.8* 9.0  MG  --  2.1   Liver Function Tests: Recent Labs  Lab 12/15/22 0336  AST 25  ALT 32  ALKPHOS 86  BILITOT 1.0  PROT 6.0*  ALBUMIN 3.6   No results for input(s): "LIPASE", "AMYLASE" in the last 168 hours. No results for input(s): "AMMONIA" in the last 168 hours. Coagulation Profile: Recent Labs  Lab 12/14/22 2028  INR 1.1   CBC: Recent Labs  Lab 12/14/22 2028 12/15/22 0336 12/16/22 0324  WBC 7.1 6.0 6.6  NEUTROABS  --  3.4  --   HGB 14.8 14.1 14.8  HCT 43.6 41.3 45.1  MCV 87.9 89.4 90.0  PLT 142* 140* 144*   Cardiac Enzymes: No results for input(s): "CKTOTAL", "CKMB", "CKMBINDEX", "TROPONINI" in the last 168 hours. BNP: Invalid input(s): "POCBNP" CBG: No results for input(s): "GLUCAP" in the  last 168 hours. HbA1C: No results for input(s): "HGBA1C" in the last 72 hours. Urine analysis: No results found for: "COLORURINE", "APPEARANCEUR", "LABSPEC", "PHURINE", "GLUCOSEU", "HGBUR", "BILIRUBINUR", "KETONESUR", "PROTEINUR", "UROBILINOGEN", "NITRITE", "LEUKOCYTESUR" Sepsis Labs: @LABRCNTIP (procalcitonin:4,lacticidven:4) )No results found for this or any previous visit (from the past 240 hour(s)).   Scheduled Meds:  apixaban  5 mg Oral BID   atorvastatin  40 mg Oral QHS   escitalopram  10 mg Oral q AM   metoprolol tartrate  37.5 mg Oral BID   Continuous Infusions:  Procedures/Studies: DG Chest Port 1 View  Result Date: 12/14/2022 CLINICAL DATA:  5107. Atrial fibrillation. Shortness of breath and lightheadedness. PFO closure procedure last week at [facility]. EXAM: PORTABLE CHEST 1 VIEW COMPARISON:  PA Lat 01/25/2018 FINDINGS: Heart size and vasculature are normal aside from mild aortic tortuosity and calcification. Metallic PFO closure material is faintly visible. The lungs are clear. The sulci are sharp. Mild degenerative change thoracic spine. IMPRESSION: 1. No active disease. 2. Aortic atherosclerosis. 3. Metallic PFO closure material. Electronically Signed   By: Almira Bar M.D.   On: 12/14/2022 20:53   ECHOCARDIOGRAM LIMITED  Result Date: 12/04/2022    ECHOCARDIOGRAM LIMITED REPORT   Patient Name:   Raysean Graumann Date of Exam: 12/04/2022 Medical Rec #:  [id number]   Height:       71.0 in Accession #:    [id number]  Weight:       205.0 lb Date of Birth:  [birth year]   BSA:          2.131 m Patient Age:    66 years    BP:           117/74 mmHg Patient Gender: M           HR:           56 bpm. Exam Location:  Inpatient Procedure: 2D Echo and Color Doppler Indications:    s/p PFO closure  History:        Patient has prior history of Echocardiogram examinations, most                 recent 10/26/2022. PFO, s/p PFO closure w/ 25mm Amplatzer 12/04/22;                 Stroke.  Sonographer:    Milbert Coulter Referring Phys: [name] IMPRESSIONS  1. Mobile atrial septum 25 mm Amplatzer occluder in place no obvious residual shunt by color flow.  2. The aortic valve is tricuspid. There is mild calcification of the aortic valve. There is mild thickening of the aortic valve. Aortic valve regurgitation is not visualized. Aortic valve sclerosis is present, with no evidence of aortic valve stenosis. FINDINGS  Aortic  Valve: The aortic valve is tricuspid. There is mild calcification of the aortic valve. There is mild thickening of the aortic valve. Aortic valve regurgitation is not visualized. Aortic valve sclerosis is present, with no evidence of aortic valve  stenosis. IAS/Shunts: No atrial level shunt detected by color flow Doppler. Additional Comments: Color Doppler performed.  Charlton Haws MD Electronically signed by Charlton Haws MD Signature Date/Time: 12/04/2022/12:41:05 PM    Final    CARDIAC CATHETERIZATION  Result Date: 12/04/2022 Successful PFO closure using a 25 mm Amplatzer Talisman PFO occluder using intracardiac echo and fluoroscopic guidance Recommendations: DAPT with ASA 81 mg and clopidogrel 75 mg daily x 6 months Limited 2D echo prior to DC 4 hours bedrest DC today if criteria met SBE prophylaxis when indicated x 6 months    Catarina Hartshorn, DO  [facility]  If 7PM-7AM, please contact night-coverage [email or web address] Password [facility] 12/16/2022, 5:40 PM   LOS: 1 day

## 2022-12-16 NOTE — Progress Notes (Signed)
Patient has rested well, wife has been at beside.  Patient vitals have been stable with no new complaints.

## 2022-12-16 NOTE — Progress Notes (Signed)
Rounding Note    Patient Name: Phillipe Clemon Date of Encounter: 12/16/2022   [facility] Cardiologist: Nanetta Batty, MD   Subjective   Some recurrent palpitations this morning.   Inpatient Medications    Scheduled Meds:  atorvastatin  40 mg Oral QHS   escitalopram  10 mg Oral q AM   metoprolol tartrate  25 mg Oral BID   Continuous Infusions:  heparin 1,400 Units/hr (12/16/22 0911)   PRN Meds: acetaminophen **OR** acetaminophen, ondansetron **OR** ondansetron (ZOFRAN) IV, oxyCODONE   Vital Signs    Vitals:   12/15/22 0406 12/15/22 1306 12/15/22 2005 12/16/22 0434  BP: 101/69 110/85 112/78 111/77  Pulse: 82 84 71 71  Resp:  17    Temp: 97.9 F (36.6 C) 97.6 F (36.4 C) 98.2 F (36.8 C) (!) 97.5 F (36.4 C)  TempSrc: Oral Oral Oral Oral  SpO2: 97% 97% 97% 99%  Weight:      Height:        Intake/Output Summary (Last 24 hours) at 12/16/2022 0917 Last data filed at 12/16/2022 0700 Gross per 24 hour  Intake 720 ml  Output --  Net 720 ml      12/15/2022   12:08 AM 12/14/2022    8:11 PM 12/04/2022    6:08 AM  Last 3 Weights  Weight (lbs) 212 lb 1.3 oz 205 lb 205 lb  Weight (kg) 96.2 kg 92.987 kg 92.987 kg      Telemetry    Afib variable rates - Personally Reviewed  ECG    N/a - Personally Reviewed  Physical Exam   GEN: No acute distress.   Neck: No JVD Cardiac: irreg Respiratory: Clear to auscultation bilaterally. GI: Soft, nontender, non-distended  MS: No edema; No deformity. Neuro:  Nonfocal  Psych: Normal affect   Labs    High Sensitivity Troponin:   Recent Labs  Lab 12/14/22 2028 12/14/22 2218  TROPONINIHS 3 4     Chemistry Recent Labs  Lab 12/14/22 2028 12/15/22 0336  NA 137 141  K 3.7 4.0  CL 104 107  CO2 25 27  GLUCOSE 89 92  BUN 15 14  CREATININE 1.00 1.01  CALCIUM 8.8* 9.0  MG  --  2.1  PROT  --  6.0*  ALBUMIN  --  3.6  AST  --  25  ALT  --  32  ALKPHOS  --  86  BILITOT  --  1.0  GFRNONAA >60 >60   ANIONGAP 8 7    Lipids  Recent Labs  Lab 12/15/22 0336  CHOL 91  TRIG 39  HDL 43  LDLCALC 40  CHOLHDL 2.1    Hematology Recent Labs  Lab 12/14/22 2028 12/15/22 0336 12/16/22 0324  WBC 7.1 6.0 6.6  RBC 4.96 4.62 5.01  HGB 14.8 14.1 14.8  HCT 43.6 41.3 45.1  MCV 87.9 89.4 90.0  MCH 29.8 30.5 29.5  MCHC 33.9 34.1 32.8  RDW 14.1 13.9 14.2  PLT 142* 140* 144*   Thyroid  Recent Labs  Lab 12/14/22 2218  TSH 1.953    BNPNo results for input(s): "BNP", "PROBNP" in the last 168 hours.  DDimer No results for input(s): "DDIMER" in the last 168 hours.   Radiology    DG Chest Port 1 View  Result Date: 12/14/2022 CLINICAL DATA:  5107. Atrial fibrillation. Shortness of breath and lightheadedness. PFO closure procedure last week at [facility]. EXAM: PORTABLE CHEST 1 VIEW COMPARISON:  PA Lat 01/25/2018 FINDINGS: Heart size and vasculature are  normal aside from mild aortic tortuosity and calcification. Metallic PFO closure material is faintly visible. The lungs are clear. The sulci are sharp. Mild degenerative change thoracic spine. IMPRESSION: 1. No active disease. 2. Aortic atherosclerosis. 3. Metallic PFO closure material. Electronically Signed   By: Almira Bar M.D.   On: 12/14/2022 20:53    Cardiac Studies    Patient Profile     Baby Stairs is a 67 y.o. 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 Flossie Dibble.    Assessment & Plan    Afib - new diagnosis this admission - from review there is a link between percutanous PFO closure and new onset of afib, occurs <5% of patients with peak around 14 days after procedure - he is on lopressor 25mg  bid, bp's are tolerating. Rates overall controlled.  - has been on hep gtt, transition to eliquis 5mg  bid today. CHADS2Vasc score is at least 3 - would  warrant in the future outpatient monitor to see if afib isolated to post procedure period or longer term issue.   - had done well overnight even ambulated without symptoms. Some recurrent palpitations early this AM with washing up, did have some tachycardia on tele. Increase lopressor to 37.5mg  bid, monitor rates and bp's into afternoons. Depending on how he does could consider d/c later in the day    2.PFO closure - procedure was done 12/03/12 - recs were for ASA/plavix x 6 months. Spoke with Dr Excell Seltzer, would be ok to stop DAPT and treat with just eliquis.        For questions or updates, please contact [facility] Please consult [email or web address] for contact info under        Signed, Dina Rich, MD  12/16/2022, 9:17 AM

## 2022-12-16 NOTE — Consult Note (Signed)
.  rxANTICOAGULATION CONSULT NOTE - Initial Consult  Pharmacy Consult for heparin infusion  Indication: atrial fibrillation  No Known Allergies  Patient Measurements: Height: 5\' 11"  (180.3 cm) Weight: 96.2 kg (212 lb 1.3 oz) IBW/kg (Calculated) : 75.3 Heparin Dosing Weight: 93kg  Vital Signs: Temp: 97.5 F (36.4 C) (07/24 0434) Temp Source: Oral (07/24 0434) BP: 111/77 (07/24 0434) Pulse Rate: 71 (07/24 0434)  Labs: Recent Labs    12/14/22 2028 12/14/22 2218 12/15/22 0336 12/15/22 0936 12/16/22 0324  HGB 14.8  --  14.1  --  14.8  HCT 43.6  --  41.3  --  45.1  PLT 142*  --  140*  --  144*  APTT 27  --   --   --   --   LABPROT 13.9  --   --   --   --   INR 1.1  --   --   --   --   HEPARINUNFRC  --   --  0.39 0.45 0.43  CREATININE 1.00  --  1.01  --   --   TROPONINIHS 3 4  --   --   --     Estimated Creatinine Clearance: 84 mL/min (by C-G formula based on SCr of 1.01 mg/dL).   Medical History: Past Medical History:  Diagnosis Date   Allergy    SEASONAL   Aortic root aneurysm (HCC) 01/11/2018   4.7 centimeters at sinuses of Valsalva, 4.2 cm mid ascending   Arthritis    Cataract    BILATERAL   GERD (gastroesophageal reflux disease)    History of repair of hiatal hernia 2001   Sleep apnea    On CPAP    Medications: (affecting aticoagulation) Heparin infusion Clopidogrel 75mg  Daily ASA EC 81mg  Daily ^^ DAPT for 6 months for PFO closure on 12/04/2022  Assessment:  67 yr old male presented to ED via POV c/o palpitations with widely varied HR since around 3pm this afternoon. He also reports feeling SOB and lightheaded since onset of symptoms. Pt had a stroke on May 6th and had a PFO closure last week at [facility] on 12/04/2022. Pt denies Afib. PMH includes CVA, PFO, AAA.   Goal of Therapy:  Heparin level 0.3-0.7 units/ml Monitor platelets by anticoagulation protocol: Yes   Plan: Date Time aPTT/HL Rate/Comment 7/23 0336 0.39  Therapeutic x  1 7/23 0936 0.45  Therapeutic x 2 7/24 0324 0.43  Therapeutic     PLAN: Continue heparin infusion at 1400 units/hour. Check HL with AM labs Continue to monitor CBC daily while on heparin infusion.   Will M. Dareen Piano, PharmD Clinical Pharmacist 12/16/2022 8:44 AM

## 2022-12-17 DIAGNOSIS — E782 Mixed hyperlipidemia: Secondary | ICD-10-CM | POA: Diagnosis not present

## 2022-12-17 DIAGNOSIS — I4891 Unspecified atrial fibrillation: Secondary | ICD-10-CM | POA: Diagnosis not present

## 2022-12-17 DIAGNOSIS — I719 Aortic aneurysm of unspecified site, without rupture: Secondary | ICD-10-CM | POA: Diagnosis not present

## 2022-12-17 DIAGNOSIS — Q2112 Patent foramen ovale: Secondary | ICD-10-CM | POA: Diagnosis not present

## 2022-12-17 LAB — CBC
Hemoglobin: 15.3 g/dL (ref 13.0–17.0)
MCV: 90.4 fL (ref 80.0–100.0)
Platelets: 142 10*3/uL — ABNORMAL LOW (ref 150–400)
RBC: 5.13 MIL/uL (ref 4.22–5.81)
RDW: 14.1 % (ref 11.5–15.5)
WBC: 6.5 10*3/uL (ref 4.0–10.5)

## 2022-12-17 LAB — BASIC METABOLIC PANEL
BUN: 12 mg/dL (ref 8–23)
Calcium: 8.9 mg/dL (ref 8.9–10.3)
Creatinine, Ser: 1 mg/dL (ref 0.61–1.24)
GFR, Estimated: >60 mL/min (ref >60)
Glucose, Bld: 99 mg/dL (ref 70–99)
Potassium: 4.4 mmol/L (ref 3.5–5.1)
Sodium: 139 mmol/L (ref 135–145)

## 2022-12-17 LAB — MAGNESIUM: Magnesium: 1.9 mg/dL (ref 1.7–2.4)

## 2022-12-17 MED ORDER — METOPROLOL TARTRATE 25 MG PO TABS: 37.5000 mg | ORAL_TABLET | Freq: Two times a day (BID) | ORAL | 1 refills | Status: DC

## 2022-12-17 MED ORDER — APIXABAN 5 MG PO TABS: 5.0000 mg | ORAL_TABLET | Freq: Two times a day (BID) | ORAL | 1 refills | Status: DC

## 2022-12-17 NOTE — Progress Notes (Signed)
Rounding Note    Patient Name: Marvin Harvey Date of Encounter: 12/17/2022  [facility] Cardiologist: Nanetta Batty, MD   Subjective   No complaints  Inpatient Medications    Scheduled Meds:  apixaban  5 mg Oral BID   atorvastatin  40 mg Oral QHS   escitalopram  10 mg Oral q AM   metoprolol tartrate  37.5 mg Oral BID   Continuous Infusions:  PRN Meds: acetaminophen **OR** acetaminophen, ondansetron **OR** ondansetron (ZOFRAN) IV, oxyCODONE   Vital Signs    Vitals:   12/16/22 2038 12/16/22 2333 12/17/22 0421 12/17/22 0907  BP: 112/80 102/78 109/80 127/82  Pulse: 75 66 82 77  Resp: 18  19 20   Temp: 98.7 F (37.1 C)  97.7 F (36.5 C)   TempSrc: Oral  Oral   SpO2: 100% 99% 97% 98%  Weight:      Height:        Intake/Output Summary (Last 24 hours) at 12/17/2022 0925 Last data filed at 12/16/2022 1756 Gross per 24 hour  Intake 835.8 ml  Output --  Net 835.8 ml      12/15/2022   12:08 AM 12/14/2022    8:11 PM 12/04/2022    6:08 AM  Last 3 Weights  Weight (lbs) 212 lb 1.3 oz 205 lb 205 lb  Weight (kg) 96.2 kg 92.987 kg 92.987 kg      Telemetry    Afibs 70s to 80s - Personally Reviewed  ECG    N/a - Personally Reviewed  Physical Exam   GEN: No acute distress.   Neck: No JVD Cardiac: irreg Respiratory: Clear to auscultation bilaterally. GI: Soft, nontender, non-distended  MS: No edema; No deformity. Neuro:  Nonfocal  Psych: Normal affect   Labs    High Sensitivity Troponin:   Recent Labs  Lab 12/14/22 2028 12/14/22 2218  TROPONINIHS 3 4     Chemistry Recent Labs  Lab 12/14/22 2028 12/15/22 0336 12/17/22 0411  NA 137 141 139  K 3.7 4.0 4.4  CL 104 107 105  CO2 25 27 29   GLUCOSE 89 92 99  BUN 15 14 12   CREATININE 1.00 1.01 1.00  CALCIUM 8.8* 9.0 8.9  MG  --  2.1 1.9  PROT  --  6.0*  --   ALBUMIN  --  3.6  --   AST  --  25  --   ALT  --  32  --   ALKPHOS  --  86  --   BILITOT  --  1.0  --   GFRNONAA >60 >60 >60   ANIONGAP 8 7 5     Lipids  Recent Labs  Lab 12/15/22 0336  CHOL 91  TRIG 39  HDL 43  LDLCALC 40  CHOLHDL 2.1    Hematology Recent Labs  Lab 12/15/22 0336 12/16/22 0324 12/17/22 0411  WBC 6.0 6.6 6.5  RBC 4.62 5.01 5.13  HGB 14.1 14.8 15.3  HCT 41.3 45.1 46.4  MCV 89.4 90.0 90.4  MCH 30.5 29.5 29.8  MCHC 34.1 32.8 33.0  RDW 13.9 14.2 14.1  PLT 140* 144* 142*   Thyroid  Recent Labs  Lab 12/14/22 2218  TSH 1.953    BNPNo results for input(s): "BNP", "PROBNP" in the last 168 hours.  DDimer No results for input(s): "DDIMER" in the last 168 hours.   Radiology    No results found.  Cardiac Studies    Patient Profile     Marvin Harvey is a 67 y.o.  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 Flossie Dibble.   Assessment & Plan    Afib - new diagnosis this admission - from review there is a link between percutanous PFO closure and new onset of afib, occurs <5% of patients with peak around 14 days after procedure - he is on lopressor 37.5 mg bid, bp's are tolerating. Had some high HRs with ambulation yesterday, we increased the lopressor dose just yesterday to 37.5mg  bid.  - tele rates primarily 80s CHADS2Vasc score is at least 3, started on eliquis 5mg  bid  - would warrant in the future outpatient monitor to see if afib isolated to post procedure period or longer term issue.       2.PFO closure - procedure was done 12/03/12 - recs were for ASA/plavix x 6 months. Spoke with Dr Excell Seltzer, would be ok to stop DAPT and treat with just eliquis.   Ok for discharge, we will arrange outpatient f/u.   For questions or updates, please contact [facility] Please consult [email or web address] for contact info under        Signed, Dina Rich, MD  12/17/2022, 9:25 AM

## 2022-12-17 NOTE — Progress Notes (Signed)
Patient rested through the night, no acute overnight events.  

## 2022-12-17 NOTE — Discharge Summary (Signed)
Physician Discharge Summary   Patient: Marvin Harvey MRN: [id number] DOB: 1956/03/06  Admit date:     12/14/2022  Discharge date: 12/17/22  Discharge Physician: Marvin Harvey   PCP: Marvin Bellows, DO   Recommendations at discharge:   Please follow up with primary care provider within 1-2 weeks  Please repeat BMP and CBC in one week     Hospital Course: Marvin Harvey is a 67 y.o. male with medical history significant of CVA, GERD, hyperlipidemia, presents the ER with a chief complaint of palpitations and chest pain.   Patient recently had PFO repair on July 12.  He reports he has felt generally drained since then.  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 On the a.m. of presentation he woke up with palpitations, then at 3:00 in the afternoon they became more intense and he had chest pain.  He describes the chest pain as substernal and pressure-like.  His symptoms are constant.  When he came into the ER he was found to be in atrial fibrillation.    Patient does not smoke, does not drink.  He did not get vaccinated for COVID or flu.  Patient is full code.  Assessment and Plan: New Onset atrial fibrillation, type unspecified -per cardiology there is a link between percutanous PFO closure and new onset of afib, occurs <5% of patients with peak around 14 days after procedure  -metoprolol increased ti 37.5 mg bid as pt still had tachycardia into 130s with ambulation lasting <  5 sec -initially on on IV heparin>>apixaban -7/25--cleared by cardiology for d/c;  can take extra metoprolol 25 prn tachycardia at home   .PFO closure - procedure was done 12/03/12 - recs were for ASA/plavix x 6 months.  - Dr. Derek Harvey with Dr Marvin Harvey>>ok to stop DAPT and treat with just eliquis.    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   apixaban 5 MG Tabs tablet Commonly known as: ELIQUIS Take 1 tablet (5 mg total) by mouth 2 (two) times daily.   atorvastatin 40 MG tablet Commonly known as: LIPITOR Take 40 mg by mouth at bedtime.   Coenzyme Q10 100 MG Tabs Take 100 mg by mouth in the morning.   levocetirizine 5 MG tablet Commonly known as: XYZAL Take 5 mg by mouth daily as needed for allergies.   Lexapro 10 MG tablet Generic drug: escitalopram Take 10 mg by mouth in the morning.   metoprolol tartrate 25 MG tablet Commonly known as: LOPRESSOR Take 1.5 tablets (37.5 mg total) by mouth 2 (two) times daily.   Nasacort Allergy 24HR 55 MCG/ACT Aero nasal inhaler Generic drug: triamcinolone Place 1 spray into the nose daily as needed (congestion/allergies.).  Follow-up Information     Marvin Asters, NP Follow up on 12/29/2022.   Specialty: Cardiology Why: Cardiology Hospital Follow-up on 12/29/2022 at 1:55 PM. Contact information: [address] [place] [address] [phone]                Discharge Exam: Marvin Harvey Weights   12/14/22 2011 12/15/22 0008  Weight: 93 kg 96.2 kg   HEENT:  [name], No thrush, no icterus CV:  IRRR, no rub, no S3, no S4 Lung:  CTA, no wheeze, no rhonchi Abd:  soft/+BS, NT Ext:  No edema, no lymphangitis, no synovitis, no rash   Condition at discharge: stable  The results of significant diagnostics from this hospitalization (including imaging,  microbiology, ancillary and laboratory) are listed below for reference.   Imaging Studies: DG Chest Port 1 View  Result Date: 12/14/2022 CLINICAL DATA:  5107. Atrial fibrillation. Shortness of breath and lightheadedness. PFO closure procedure last week at [facility]. EXAM: PORTABLE CHEST 1 VIEW COMPARISON:  PA Lat 01/25/2018 FINDINGS: Heart size and vasculature are normal aside from mild aortic tortuosity and calcification. Metallic PFO closure material is faintly visible. The lungs are clear. The sulci are sharp. Mild degenerative change thoracic spine. IMPRESSION: 1. No active disease. 2. Aortic atherosclerosis. 3. Metallic PFO closure material. Electronically Signed   By: Marvin Harvey M.D.   On: 12/14/2022 20:53   ECHOCARDIOGRAM LIMITED  Result Date: 12/04/2022    ECHOCARDIOGRAM LIMITED REPORT   Patient Name:   Marvin Harvey Date of Exam: 12/04/2022 Medical Rec #:  [id number]   Height:       71.0 in Accession #:    [id number]  Weight:       205.0 lb Date of Birth:  01-25-1956   BSA:          2.131 m Patient Age:    66 years    BP:           117/74 mmHg Patient Gender: M           HR:           56 bpm. Exam Location:  Inpatient Procedure: 2D Echo and Color Doppler Indications:    s/p PFO closure  History:        Patient has prior history of Echocardiogram examinations, most                 recent 10/26/2022. PFO, s/p PFO closure w/ 25mm Amplatzer 12/04/22;                 Stroke.  Sonographer:    Marvin Harvey Referring Phys: Marvin Harvey IMPRESSIONS  1. Mobile atrial septum 25 mm Amplatzer occluder in place no obvious residual shunt by color flow.  2. The aortic valve is tricuspid. There is mild calcification of the aortic valve. There is mild thickening of the aortic valve. Aortic valve regurgitation is not visualized. Aortic valve sclerosis is present, with no evidence of aortic valve stenosis. FINDINGS  Aortic Valve: The aortic valve is tricuspid. There is mild calcification of the aortic valve. There is mild  thickening of the aortic valve. Aortic valve regurgitation is not visualized. Aortic valve sclerosis is present, with no evidence of aortic valve  stenosis. IAS/Shunts: No atrial level shunt detected by color flow Doppler. Additional Comments: Color Doppler performed.  Charlton Haws MD Electronically signed by Charlton Haws MD Signature Date/Time: 12/04/2022/12:41:05 PM    Final    CARDIAC CATHETERIZATION  Result Date: 12/04/2022 Successful PFO  closure using a 25 mm Amplatzer Talisman PFO occluder using intracardiac echo and fluoroscopic guidance Recommendations: DAPT with ASA 81 mg and clopidogrel 75 mg daily x 6 months Limited 2D echo prior to DC 4 hours bedrest DC today if criteria met SBE prophylaxis when indicated x 6 months    Microbiology: No results found for this or any previous visit.  Labs: CBC: Recent Labs  Lab 12/14/22 2028 12/15/22 0336 12/16/22 0324 12/17/22 0411  WBC 7.1 6.0 6.6 6.5  NEUTROABS  --  3.4  --   --   HGB 14.8 14.1 14.8 15.3  HCT 43.6 41.3 45.1 46.4  MCV 87.9 89.4 90.0 90.4  PLT 142* 140* 144* 142*   Basic Metabolic Panel: Recent Labs  Lab 12/14/22 2028 12/15/22 0336 12/17/22 0411  NA 137 141 139  K 3.7 4.0 4.4  CL 104 107 105  CO2 25 27 29   GLUCOSE 89 92 99  BUN 15 14 12   CREATININE 1.00 1.01 1.00  CALCIUM 8.8* 9.0 8.9  MG  --  2.1 1.9   Liver Function Tests: Recent Labs  Lab 12/15/22 0336  AST 25  ALT 32  ALKPHOS 86  BILITOT 1.0  PROT 6.0*  ALBUMIN 3.6   CBG: No results for input(s): "GLUCAP" in the last 168 hours.  Discharge time spent: greater than 30 minutes.  Signed: Catarina Hartshorn, MD [facility] 12/17/2022

## 2022-12-17 NOTE — Progress Notes (Signed)
Patient states understanding of discharge instructions.  

## 2022-12-18 ENCOUNTER — Telehealth: Payer: Self-pay | Admitting: Cardiovascular Disease

## 2022-12-18 NOTE — Telephone Encounter (Signed)
Pt would like a callback regarding Loop Recorder procedure that is to be done on 8/1. Pt states that he needs to cancel. Please advise

## 2022-12-18 NOTE — Telephone Encounter (Signed)
Patient states he is wanting to cancel the loop recorder as he has been in hospital regarding the issues the recorder was for.  He has F/U with J Cleaver 8/6.  Please Cancell Loop procedure

## 2022-12-24 ENCOUNTER — Ambulatory Visit: Payer: Medicare Other | Admitting: Cardiovascular Disease

## 2022-12-27 NOTE — Progress Notes (Unsigned)
Cardiology Clinic Note   Patient Name: Marvin Harvey Date of Encounter: 12/29/2022  Primary Care Provider:  Jonathon Bellows, DO Primary Cardiologist:  Nanetta Batty, MD  Patient Profile    Marvin Harvey 67 year old male presents to the clinic today for posthospital follow-up.  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   4.7 centimeters at sinuses of Valsalva, 4.2 cm mid ascending   Arthritis    Cataract    BILATERAL   GERD (gastroesophageal reflux disease)    History of repair of hiatal hernia 2001   Sleep apnea    On CPAP   Past Surgical History:  Procedure Laterality Date   CLOSED REDUCTION HAND FRACTURE Right    COLONOSCOPY     LEFT HEART CATH AND CORS/GRAFTS ANGIOGRAPHY N/A 01/26/2018   Procedure: LEFT HEART CATH AND CORS/GRAFTS ANGIOGRAPHY;  Surgeon: Runell Gess, MD;  Location: [facility] INVASIVE CV LAB;  Service: Cardiovascular;  Laterality: N/A;   NISSEN FUNDOPLICATION  2004   Complete in [place], [place]   PATENT FORAMEN OVALE(PFO) CLOSURE N/A 12/04/2022   Procedure: PATENT FORAMEN OVALE(PFO) CLOSURE;  Surgeon: [name], MD;  Location: [facility] INVASIVE CV LAB;  Service: Cardiovascular;  Laterality: N/A;   POLYPECTOMY     TEE WITHOUT CARDIOVERSION N/A 10/26/2022   Procedure: TRANSESOPHAGEAL ECHOCARDIOGRAM;  Surgeon: Christell Constant, MD;  Location: [facility] INVASIVE CV LAB;  Service: Cardiovascular;  Laterality: N/A;    Allergies  No Known Allergies  History of Present Illness    Marvin Harvey has a PMH of-both aortic root aneurysm, GERD, chest pain, gout, and CVA.  He underwent cardiac catheterization 01/26/2018 which showed normal coronary arteries.  After that he followed up with cardiology for echocardiograms to monitor his aortic valve.  His CTA showed a 5.2 cm aortic aneurysm.  He reported that his father had thoracic aortic aneurysm which was repaired in his 16s.  He was seen by  Dr. 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  He denied chest pain and shortness of breath. He was seen and evaluated by cardiology 5/22.  He was seen in follow-up by Dr. Allyson Sabal on 02/25/2022.  At that time he reported he wished to be evaluated for inspire device.  He was admitted to the hospital 09/28/2022 and discharged on 09/29/2022.  He was diagnosed with acute CVA.  He was instructed to follow-up with neurology in 3 to 4 weeks.  He was placed on aspirin and Plavix.  He was instructed to continue Plavix for 21 days and then continue aspirin thereafter.  He was started on a atorvastatin and Lexapro.  He contacted the nurse triage line and requested cardiology appointment.  He reported that he had been told he had a hole in his heart.  From diagnostic testing done at [facility] I see no evidence of echocardiogram.  Lower extremity ultrasound showed no DVT.  Chest x-ray was negative.  CT brain showed no large vessel occlusion, no target or acute endovascular stroke intervention.  Multilevel degenerative changes of the C-spine, and age-indeterminate infarcts of the right cerebellum and left caudate nucleus.  CT angio chest aorta 10/13/2022 showed stable ascending thoracic aortic aneurysm measuring 4.2 cm.  Stable moderate large hiatal hernia.  Recommendation for annual CTA or MRA was planned.  He presented to the clinic 10/16/22  for follow-up  evaluation and stated he was concerned for PFO after recent echocardiogram during admission for CVA.  We reviewed his recent hospital admission.  I did not have echocardiogram for review at the time.  Case discussed with DOD.  Recommendation for TEE and referral to structural heart if PFO closure is needed was discussed.  His EKG  showed sinus bradycardia with first-degree AV block.  Blood pressure well-controlled at 118/75.  I also requested results from cardiac event monitor.  CT showed no  carotid plaque.  Lower extremity Dopplers negative for DVT.  We reviewed his most recent CT angio chest.  He and his wife expressed understanding.  I planned follow-up in 3 to 4 months.  He was admitted on 12/15/2022 and discharged on 12/17/2022.  He underwent PFO closure on 12/04/2022.  Cardiology was consulted on 12/15/2022 for evaluation of atrial fibrillation which was rate controlled.  This was new atrial fibrillation.  He was continued on metoprolol 37.5 mg twice daily.  His blood pressure was well-controlled.  His heart rates were in the 80s.  His CHA2DS2-VASc score was noted to be 3 and he was started on apixaban 5 mg twice daily.  For his PFO closure by Dr. Excell Seltzer it was recommended that he continue aspirin and Plavix for 6 months however with treatment of apixaban it was felt that he would be okay to discontinue aspirin and Plavix.  He presents to the clinic today for follow-up evaluation and states he has been doing well.  He has been monitoring his heart rate and blood pressure.  He feels he converted to sinus rhythm on 729.  At that time he reduced his metoprolol because his heart rate decreased.  His blood pressure today is 106/84.  His pulse is 60.  We reviewed the importance of continuing anticoagulation and rate control.  He has a follow-up with the structural heart team on Friday.  I will plan to have him wear a cardiac event monitor for 30 days around 90 days post PFO closure to verify that he is not having any further atrial fibrillation.  I will keep his follow-up with Dr. Allyson Sabal in September and plan to see him back at the first part of December.  Today he denies chest pain, shortness of breath, lower extremity edema, fatigue, palpitations, melena, hematuria, hemoptysis, diaphoresis, weakness, presyncope, syncope, orthopnea, and PND.    Home Medications    Prior to Admission medications   Medication Sig Start Date End Date Taking? Authorizing Provider  allopurinol (ZYLOPRIM) 300 MG  tablet Take 450 mg by mouth every evening.  11/01/17   [provider]  aspirin EC 81 MG tablet Take 81 mg by mouth daily.    [provider]  atorvastatin (LIPITOR) 40 MG tablet Take 40 mg by mouth daily.    [provider]  co-enzyme Q-10 30 MG capsule Take 30 mg by mouth 3 (three) times daily.    [provider]  fexofenadine (ALLEGRA) 60 MG tablet Take 60 mg by mouth as needed for allergies or rhinitis.    [provider]  levocetirizine (XYZAL) 5 MG tablet Take 5 mg by mouth every evening.    [provider]  LEXAPRO 5 MG tablet Take by mouth. 09/29/22   [provider]  Multiple Vitamins-Minerals (MULTIVITAMIN WITH MINERALS) tablet Take 1 tablet by mouth daily.    [provider]  OVER THE COUNTER MEDICATION Nasocort nasal spray, as needed for allergies.    [provider]  PLAVIX 75  MG tablet Take by mouth. 09/29/22   [provider]    Family History    Family History  Problem Relation Age of Onset   Atrial fibrillation Mother    Transient ischemic attack Mother    Colon polyps Father    Coronary artery disease Father        s/p CABGx3, Valve replacement   Aortic aneurysm Father        repaired   Colon cancer Neg Hx    Esophageal cancer Neg Hx    Rectal cancer Neg Hx    Stomach cancer Neg Hx    Liver disease Neg Hx    He indicated that his mother is deceased. He indicated that his father is deceased. He indicated that his maternal grandmother is deceased. He indicated that his maternal grandfather is deceased. He indicated that his paternal grandmother is deceased. He indicated that his paternal grandfather is deceased. He indicated that the status of his neg hx is unknown.  Social History    Social History   Socioeconomic History   Marital status: Married    Spouse name: Not on file   Number of children: Not on file   Years of education: Not on file   Highest education level: Not on  file  Occupational History   Not on file  Tobacco Use   Smoking status: Never   Smokeless tobacco: Never  Vaping Use   Vaping status: Never Used  Substance and Sexual Activity   Alcohol use: Never   Drug use: Never   Sexual activity: Not on file  Other Topics Concern   Not on file  Social History Narrative   Are you right handed or left handed? Right   Are you currently employed ? Yes   What is your current occupation? [name]   Do you live at home alone? Yes   Who lives with you?  Wife   What type of home do you live in: 1 story or 2 story? 2       Social Determinants of Health   Financial Resource Strain: Not on file  Food Insecurity: No Food Insecurity (12/15/2022)   Hunger Vital Sign    Worried About Running Out of Food in the Last Year: Never true    Ran Out of Food in the Last Year: Never true  Transportation Needs: No Transportation Needs (12/15/2022)   PRAPARE - Administrator, Civil Service (Medical): No    Lack of Transportation (Non-Medical): No  Physical Activity: Not on file  Stress: Not on file  Social Connections: Not on file  Intimate Partner Violence: Not At Risk (12/15/2022)   Humiliation, Afraid, Rape, and Kick questionnaire    Fear of Current or Ex-Partner: No    Emotionally Abused: No    Physically Abused: No    Sexually Abused: No     Review of Systems    General:  No chills, fever, night sweats or weight changes.  Cardiovascular:  No chest pain, dyspnea on exertion, edema, orthopnea, palpitations, paroxysmal nocturnal dyspnea. Dermatological: No rash, lesions/masses Respiratory: No cough, dyspnea Urologic: No hematuria, dysuria Abdominal:   No nausea, vomiting, diarrhea, bright red blood per rectum, melena, or hematemesis Neurologic:  No visual changes, wkns, changes in mental status. All other systems reviewed and are otherwise negative except as noted above.  Physical Exam    VS:  BP 106/84   Pulse 60   Ht 5\' 11"  (1.803 m)    Wt 210  lb 12.8 oz (95.6 kg)   SpO2 97%   BMI 29.40 kg/m  , BMI Body mass index is 29.4 kg/m. GEN: Well nourished, well developed, in no acute distress. HEENT: normal. Neck: Supple, no JVD, carotid bruits, or masses. Cardiac: RRR, no murmurs, rubs, or gallops. No clubbing, cyanosis, edema.  Radials/DP/PT 2+ and equal bilaterally.  Respiratory:  Respirations regular and unlabored, clear to auscultation bilaterally. GI: Soft, nontender, nondistended, BS + x 4. MS: no deformity or atrophy. Skin: warm and dry, no rash. Neuro:  Strength and sensation are intact. Psych: Normal affect.  Accessory Clinical Findings    Recent Labs: 12/14/2022: TSH 1.953 12/15/2022: ALT 32 12/17/2022: BUN 12; Creatinine, Ser 1.00; Hemoglobin 15.3; Magnesium 1.9; Platelets 142; Potassium 4.4; Sodium 139   Recent Lipid Panel    Component Value Date/Time   CHOL 91 12/15/2022 0336   TRIG 39 12/15/2022 0336   HDL 43 12/15/2022 0336   CHOLHDL 2.1 12/15/2022 0336   VLDL 8 12/15/2022 0336   LDLCALC 40 12/15/2022 0336         ECG personally reviewed by me today-EKG Interpretation Date/Time:  Tuesday December 29 2022 14:25:47 EDT Ventricular Rate:  47 PR Interval:  238 QRS Duration:  90 QT Interval:  446 QTC Calculation: 394 R Axis:   -7  Text Interpretation: Sinus bradycardia with 1st degree A-V block Confirmed by Edd Fabian [phone]) on 12/29/2022 2:45:42 PM    EKG 10/16/2022 sinus bradycardia with first-degree AV block PR interval 220 ms- No acute changes   Echocardiogram 08/31/2017 Study Conclusions   - Left ventricle: The cavity size was normal. Wall thickness was    normal. Systolic function was vigorous. The estimated ejection    fraction was in the range of 65% to 70%. Wall motion was normal;    there were no regional wall motion abnormalities. Features are    consistent with a pseudonormal left ventricular filling pattern,    with concomitant abnormal relaxation and increased filling     pressure (grade 2 diastolic dysfunction).  - Aortic valve: There was mild regurgitation. Valve area (VTI):    2.84 cm^2. Valve area (Vmax): 2.89 cm^2. Valve area (Vmean): 2.96    cm^2.   -------------------------------------------------------------------  Study data:  No prior study was available for comparison.  Study  status:  Routine.  Procedure:  The patient reported no pain pre or  post test. Transthoracic echocardiography. Image quality was  adequate.  Study completion:  There were no complications.  Echocardiography.  M-mode, complete 2D, spectral Doppler, and color  Doppler.  Birthdate:  Patient birthdate: [birth year].  Age:  Patient  is 67 yr old.  Sex:  Gender: male.    BMI: 29 kg/m^2.  Blood  pressure:     117/79  Patient status:  Inpatient.  Study date:  Study date: 01/26/2018. Study time: 08:33 AM.  Location:  Bedside.    -------------------------------------------------------------------   -------------------------------------------------------------------  Left ventricle:  The cavity size was normal. Wall thickness was  normal. Systolic function was vigorous. The estimated ejection  fraction was in the range of 65% to 70%. Wall motion was normal;  there were no regional wall motion abnormalities. Features are  consistent with a pseudonormal left ventricular filling pattern,  with concomitant abnormal relaxation and increased filling pressure  (grade 2 diastolic dysfunction).   -------------------------------------------------------------------  Aortic valve:   The valve appears to be grossly normal.    Doppler:    There was no stenosis.   There was mild regurgitation.  VTI  ratio of LVOT to aortic valve: 0.9. Valve area (VTI): 2.84 cm^2.  Indexed valve area (VTI): 1.3 cm^2/m^2. Peak velocity ratio of LVOT  to aortic valve: 0.92. Valve area (Vmax): 2.89 cm^2. Indexed valve  area (Vmax): 1.32 cm^2/m^2. Mean velocity ratio of LVOT to aortic  valve: 0.94. Valve area  (Vmean): 2.96 cm^2. Indexed valve area  (Vmean): 1.35 cm^2/m^2.    Mean gradient (S): 3 mm Hg. Peak  gradient (S): 5 mm Hg.   -------------------------------------------------------------------  Aorta: Ascending aorta: The ascending aorta was mildly dilated.   -------------------------------------------------------------------  Mitral valve:   The valve appears to be grossly normal.    Doppler:   There was no significant regurgitation.    Valve area by pressure  half-time: 3.06 cm^2. Indexed valve area by pressure half-time: 1.4  cm^2/m^2.    Peak gradient (D): 2 mm Hg.   -------------------------------------------------------------------  Left atrium:  The atrium was normal in size.   -------------------------------------------------------------------  Right ventricle:  The cavity size was normal. Systolic function was  normal.   -------------------------------------------------------------------  Pulmonic valve:    The valve appears to be grossly normal.  Doppler:  There was no significant regurgitation.   -------------------------------------------------------------------  Tricuspid valve:   The valve appears to be grossly normal.  Doppler:  There was trivial regurgitation.   -------------------------------------------------------------------  Pulmonary artery:   Systolic pressure was within the normal range.    -------------------------------------------------------------------  Pericardium: There was no pericardial effusion.   -------------------------------------------------------------------  Systemic veins:  Inferior vena cava: The vessel was normal in size. The  respirophasic diameter changes were in the normal range (>= 50%),  consistent with normal central venous pressure.     Assessment & Plan   1.  New onset atrial fibrillation-cardiac unaware.  Heart rate today 60.  Reports compliance with apixaban.  Denies bleeding issues.  Noted post PFO closure.  Reviewed  importance of maintaining therapy for 90 days.  Discussed wearing 30-day cardiac event monitor 90 days post PFO closure to verify no atrial fibrillation.  Will keep follow-up with Dr.Berry in September. Continue metoprolol, apixaban  CHA2DS2-VASc score 3.  PFO closure-closure with structural heart team on 12/04/2022.  Aspirin and Plavix was recommended for 6 months post closure.  However, aspirin and Plavix discontinued due to need for apixaban. Follows with structural heart team  Aortic aneurysm-denies recent episodes of back and chest discomfort.  CT angio chest 10/13/2022 showed thoracic aortic aneurysm measuring 4.2 cm and aortic root noted to be 4.0-4.1 cm. Maintain good blood pressure control Low-sodium diet Plan for repeat CT angio chest aorta 5/25  History of CVA-TEE noted PFO.  Underwent successful PFO closure 12/04/2022.  Developed atrial fibrillation postoperatively.  Continues to be neurologically intact.   Continue apixaban    Disposition: Follow-up with Dr.Berry or me in 3-4 months.   Thomasene Ripple. Jisella Ashenfelter NP-C     12/29/2022, 2:46 PM [facility] [address] Office [phone] Fax [phone]    I spent 14 minutes examining this patient, reviewing medications, and using patient centered shared decision making involving her cardiac care.  Prior to her visit I spent greater than 20 minutes reviewing her past medical history,  medications, and prior cardiac tests.

## 2022-12-29 ENCOUNTER — Encounter: Payer: Self-pay | Admitting: General Practice

## 2022-12-29 ENCOUNTER — Ambulatory Visit: Payer: Medicare Other | Attending: General Practice | Admitting: General Practice

## 2022-12-29 VITALS — BP 106/84 | HR 60 | Ht 71.0 in | Wt 210.8 lb

## 2022-12-29 DIAGNOSIS — I639 Cerebral infarction, unspecified: Secondary | ICD-10-CM | POA: Diagnosis present

## 2022-12-29 DIAGNOSIS — I4891 Unspecified atrial fibrillation: Secondary | ICD-10-CM | POA: Diagnosis present

## 2022-12-29 DIAGNOSIS — I7121 Aneurysm of the ascending aorta, without rupture: Secondary | ICD-10-CM | POA: Insufficient documentation

## 2022-12-29 DIAGNOSIS — Q2112 Patent foramen ovale: Secondary | ICD-10-CM | POA: Diagnosis present

## 2022-12-29 MED ORDER — APIXABAN 5 MG PO TABS: 5.0000 mg | ORAL_TABLET | Freq: Two times a day (BID) | ORAL | 5 refills | Status: DC

## 2022-12-29 MED ORDER — METOPROLOL TARTRATE 25 MG PO TABS: 12.5000 mg | ORAL_TABLET | Freq: Two times a day (BID) | ORAL | 4 refills | Status: DC

## 2022-12-29 NOTE — Patient Instructions (Signed)
Medication Instructions:  The current medical regimen is effective;  continue present plan and medications as directed. Please refer to the Current Medication list given to you today.  *If you need a refill on your cardiac medications before your next appointment, please call your pharmacy*  Lab Work: NONE If you have labs (blood work) drawn today and your tests are completely normal, you will receive your results only by:  MyChart Message (if you have MyChart) OR  A paper copy in the mail If you have any lab test that is abnormal or we need to change your treatment, we will call you to review the results.  Testing/Procedures: NONE  Follow-Up: At [facility], you and your health needs are our priority.  As part of our continuing mission to provide you with exceptional heart care, we have created designated Provider Care Teams.  These Care Teams include your primary Cardiologist (physician) and Advanced Practice Providers (APPs -  Physician Assistants and Nurse Practitioners) who all work together to provide you with the care you need, when you need it.  Your next appointment:   3-4 month(s)  Provider:   Nanetta Batty, MD  or Edd Fabian, FNP        Other Instructions KEEP SCHEDULED APPT WITH DR Allyson Sabal IN [place]

## 2022-12-31 NOTE — Progress Notes (Signed)
[facility]   MULTIDISCIPLINARY HEART VALVE CLINIC                                     Cardiology Office Note:    Date:  01/01/2023   ID:  Marvin Harvey, DOB [birth year], MRN [id number]  PCP:  Jonathon Bellows, DO  [facility] Cardiologist:  Nanetta Batty, MD  [facility] Electrophysiologist:  None   Referring MD: Jonathon Bellows, DO   1 month s/p PFO closure    History of Present Illness:    Marvin Harvey is a 67 y.o. 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   He underwent PFO closure on 12/04/2022 by Dr. Excell Seltzer. He was subsequently admitted from 7/23-7/25/24 for new onset afib. He was started on metoprolol 25mg  BID and Eliqiuis. DAPT was discontinued. Since there is a link between percutanous PFO closure and new onset of afib, occurring in <5% of patients with peak around 14 days after procedure, it was decided to treat with [facility] and potentially stop OAC if no recurrence. Seen by Edd Fabian NP in the office on 12/29/22 and plan was to place a 30 day cardiac monitor 3 months out from closure to assess for recurrence of afib. Given bradycardia in sinus, metoprolol was cut back to 12.5mg  BID.  Today the patient presents to clinic for follow up. Here with his wife. Doing great. No CP or SOB. No LE edema, orthopnea or PND. No dizziness or syncope. No blood in stool or urine. No palpitations. He has had no recurrence of palpitations. 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 His HR has been stable. He felt "off" and fatigued when in afib.     Past Medical History:  Diagnosis Date   Allergy    SEASONAL   Aortic root aneurysm (HCC) 01/11/2018   4.7 centimeters at sinuses of Valsalva, 4.2 cm mid ascending   Arthritis    Cataract    BILATERAL   GERD (gastroesophageal reflux disease)    History of repair of hiatal hernia 2001   Sleep apnea     On CPAP    Past Surgical History:  Procedure Laterality Date   CLOSED REDUCTION HAND FRACTURE Right    COLONOSCOPY     LEFT HEART CATH AND CORS/GRAFTS ANGIOGRAPHY N/A 01/26/2018   Procedure: LEFT HEART CATH AND CORS/GRAFTS ANGIOGRAPHY;  Surgeon: Runell Gess, MD;  Location: [facility] INVASIVE CV LAB;  Service: Cardiovascular;  Laterality: N/A;   NISSEN FUNDOPLICATION  2004   Complete in [place], [place]   PATENT FORAMEN OVALE(PFO) CLOSURE N/A 12/04/2022   Procedure: PATENT FORAMEN OVALE(PFO) CLOSURE;  Surgeon: [name], MD;  Location: [facility] INVASIVE CV LAB;  Service: Cardiovascular;  Laterality: N/A;   POLYPECTOMY     TEE WITHOUT CARDIOVERSION N/A 10/26/2022   Procedure: TRANSESOPHAGEAL ECHOCARDIOGRAM;  Surgeon: Christell Constant, MD;  Location: [facility] INVASIVE CV LAB;  Service: Cardiovascular;  Laterality: N/A;    Current Medications: Current Meds  Medication Sig   allopurinol (ZYLOPRIM) 300 MG tablet Take 450 mg by mouth every evening.    apixaban (ELIQUIS) 5 MG TABS tablet Take 1 tablet (5 mg total) by mouth 2 (two) times daily.   atorvastatin (LIPITOR) 40 MG tablet Take 40 mg by  mouth at bedtime.   Coenzyme Q10 100 MG TABS Take 100 mg by mouth in the morning.   levocetirizine (XYZAL) 5 MG tablet Take 5 mg by mouth daily as needed for allergies.   LEXAPRO 10 MG tablet Take 10 mg by mouth in the morning.   metoprolol tartrate (LOPRESSOR) 25 MG tablet Take 0.5 tablets (12.5 mg total) by mouth 2 (two) times daily.   triamcinolone (NASACORT ALLERGY 24HR) 55 MCG/ACT AERO nasal inhaler Place 1 spray into the nose daily as needed (congestion/allergies.).     Allergies:   Patient has no known allergies.   Social History   Socioeconomic History   Marital status: Married    Spouse name: Not on file   Number of children: Not on file   Years of education: Not on file   Highest education level: Not on file  Occupational History   Not on file  Tobacco Use   Smoking status: Never    Smokeless tobacco: Never  Vaping Use   Vaping status: Never Used  Substance and Sexual Activity   Alcohol use: Never   Drug use: Never   Sexual activity: Not on file  Other Topics Concern   Not on file  Social History Narrative   Are you right handed or left handed? Right   Are you currently employed ? Yes   What is your current occupation? [name]   Do you live at home alone? Yes   Who lives with you?  Wife   What type of home do you live in: 1 story or 2 story? 2       Social Determinants of Health   Financial Resource Strain: Not on file  Food Insecurity: No Food Insecurity (12/15/2022)   Hunger Vital Sign    Worried About Running Out of Food in the Last Year: Never true    Ran Out of Food in the Last Year: Never true  Transportation Needs: No Transportation Needs (12/15/2022)   PRAPARE - Administrator, Civil Service (Medical): No    Lack of Transportation (Non-Medical): No  Physical Activity: Not on file  Stress: Not on file  Social Connections: Not on file     Family History: The patient's family history includes Aortic aneurysm in his father; Atrial fibrillation in his mother; Colon polyps in his father; Coronary artery disease in his father; Transient ischemic attack in his mother. There is no history of Colon cancer, Esophageal cancer, Rectal cancer, Stomach cancer, or Liver disease.  ROS:   Please see the history of present illness.    All other systems reviewed and are negative.  EKGs/Labs/Other Studies Reviewed:    Cardiac Studies & Procedures   CARDIAC CATHETERIZATION  CARDIAC CATHETERIZATION 12/04/2022  Narrative Successful PFO closure using a 25 mm Amplatzer Talisman PFO occluder using intracardiac echo and fluoroscopic guidance  Recommendations: DAPT with ASA 81 mg and clopidogrel 75 mg daily x 6 months Limited 2D echo prior to DC 4 hours bedrest DC today if criteria met SBE prophylaxis when indicated x 6 months   CARDIAC  CATHETERIZATION  CARDIAC CATHETERIZATION 01/26/2018  Narrative Images from the original result were not included.   The left ventricular systolic function is normal.  LV end diastolic pressure is normal.  The left ventricular ejection fraction is 55-65% by visual estimate.  Marvin Harvey is a 67 y.o. male   [id number] LOCATION:  FACILITY: [facility] PHYSICIAN: Nanetta Batty, M.D. 1955-10-22   DATE OF PROCEDURE:  01/26/2018  DATE OF  DISCHARGE:     CARDIAC CATHETERIZATION    History obtained from chart review.Marvin Harvey is a 67 y.o. male with a hx of aortic root aneurysm which is followed in [place], h/o mitral regurgitation, OSA on CPAP, family history of CAD, PVD and valvular heart disease (father) as well as history of GERD, hiatal hernia s/p repair and Gout who is being seen today for the evaluation of chest pain  Impression Marvin Harvey has normal coronary arteries and normal LV function.  I believe his chest pain is noncardiac.  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  His right femoral arterial puncture site was hemostatic hemostatically sealed with a MYNX device successfully.  He left the lab in stable condition.  He will be gently hydrated overnight, and discharged home in the morning.  Nanetta Batty. MD, [facility] 01/26/2018 5:51 PM      Recommend Aspirin 81mg  daily for moderate CAD.  Findings Coronary Findings Diagnostic  Dominance: Right  No diagnostic findings have been documented. Intervention  No interventions have been documented.   STRESS TESTS  MYOCARDIAL PERFUSION IMAGING 11/12/2014   ECHOCARDIOGRAM  ECHOCARDIOGRAM LIMITED 12/04/2022  Narrative ECHOCARDIOGRAM LIMITED REPORT    Patient Name:   Marvin Harvey Date of Exam: 12/04/2022 Medical Rec #:  [id number]   Height:       71.0 in Accession #:    [id number]  Weight:       205.0 lb Date of Birth:  [birth year]   BSA:          2.131 m Patient  Age:    66 years    BP:           117/74 mmHg Patient Gender: M           HR:           56 bpm. Exam Location:  Inpatient  Procedure: 2D Echo and Color Doppler  Indications:    s/p PFO closure  History:        Patient has prior history of Echocardiogram examinations, most recent 10/26/2022. PFO, s/p PFO closure w/ 25mm Amplatzer 12/04/22; Stroke.  Sonographer:    Milbert Coulter Referring Phys: [name]  IMPRESSIONS   1. Mobile atrial septum 25 mm Amplatzer occluder in place no obvious residual shunt by color flow. 2. The aortic valve is tricuspid. There is mild calcification of the aortic valve. There is mild thickening of the aortic valve. Aortic valve regurgitation is not visualized. Aortic valve sclerosis is present, with no evidence of aortic valve stenosis.  FINDINGS Aortic Valve: The aortic valve is tricuspid. There is mild calcification of the aortic valve. There is mild thickening of the aortic valve. Aortic valve regurgitation is not visualized. Aortic valve sclerosis is present, with no evidence of aortic valve stenosis.  IAS/Shunts: No atrial level shunt detected by color flow Doppler.  Additional Comments: Color Doppler performed.  Charlton Haws MD Electronically signed by Charlton Haws MD Signature Date/Time: 12/04/2022/12:41:05 PM    Final   TEE  ECHO TEE 10/26/2022  Narrative TRANSESOPHOGEAL ECHO REPORT    Patient Name:   Marvin Harvey Date of Exam: 10/26/2022 Medical Rec #:  [id number]   Height:       71.0 in Accession #:    [id number]  Weight:       220.9 lb Date of Birth:  [birth year]   BSA:          2.200 m Patient Age:  66 years    BP:           125/81 mmHg Patient Gender: M           HR:           67 bpm. Exam Location:  Inpatient  Procedure: 2D Echo, 3D Echo, Cardiac Doppler, Color Doppler and Saline Contrast Bubble Study  Indications:    Abnormal echocardiogram [R93.1 (ICD-10-CM)]; History of CVA (cerebrovascular accident) [Z86.73  (ICD-10-CM)]  History:        Patient has prior history of Echocardiogram examinations, most recent 10/23/2017. Aortic root aneurysm.  Sonographer:    Dondra Prader RVT RCS Referring Phys: [id number] JESSE M CLEAVER  PROCEDURE: After discussion of the risks and benefits of a TEE, an informed consent was obtained from the patient. The transesophogeal probe was passed without difficulty through the esophogus of the patient. Sedation performed by different physician. The patient developed no complications during the procedure.  IMPRESSIONS   1. PFO noted. Agitated saline contrast bubble study was positive with shunting observed within 3-6 cardiac cycles suggestive of interatrial shunt. 2. Left ventricular ejection fraction, by estimation, is 55 to 60%. The left ventricle has normal function. 3. Right ventricular systolic function is normal. The right ventricular size is not well visualized. 4. No left atrial/left atrial appendage thrombus was detected. 5. The mitral valve is abnormal. Mild mitral valve regurgitation. 6. The aortic valve is tricuspid. Aortic valve regurgitation is mild to moderate. 7. Aortic dilatation noted. There is mild dilatation of the aortic root, measuring 43 mm. There is mild dilatation of the ascending aorta, measuring 41 mm.  FINDINGS Left Ventricle: Left ventricular ejection fraction, by estimation, is 55 to 60%. The left ventricle has normal function. The left ventricular internal cavity size was normal in size.  Right Ventricle: The right ventricular size is not well visualized. Right vetricular wall thickness was not well visualized. Right ventricular systolic function is normal.  Left Atrium: Left atrial size was normal in size. No left atrial/left atrial appendage thrombus was detected.  Right Atrium: Right atrial size was normal in size.  Pericardium: There is no evidence of pericardial effusion.  Mitral Valve: Bileaflet mitral valve prolapse. The mitral valve  is abnormal. Mild mitral valve regurgitation.  Tricuspid Valve: The tricuspid valve is normal in structure. Tricuspid valve regurgitation is mild . No evidence of tricuspid stenosis.  Aortic Valve: The aortic valve is tricuspid. Aortic valve regurgitation is mild to moderate.  Pulmonic Valve: The pulmonic valve was normal in structure. Pulmonic valve regurgitation is trivial. No evidence of pulmonic stenosis.  Aorta: Aortic dilatation noted. There is mild dilatation of the aortic root, measuring 43 mm. There is mild dilatation of the ascending aorta, measuring 41 mm.  IAS/Shunts: The interatrial septum appears to be lipomatous. PFO noted. Agitated saline contrast was given intravenously to evaluate for intracardiac shunting. Agitated saline contrast bubble study was positive with shunting observed within 3-6 cardiac cycles suggestive of interatrial shunt.  Riley Lam MD Electronically signed by Riley Lam MD Signature Date/Time: 10/26/2022/4:53:50 PM    Final            EKG:  EKG is NOT ordered today.    Recent Labs: 12/14/2022: TSH 1.953 12/15/2022: ALT 32 12/17/2022: BUN 12; Creatinine, Ser 1.00; Hemoglobin 15.3; Magnesium 1.9; Platelets 142; Potassium 4.4; Sodium 139  Recent Lipid Panel    Component Value Date/Time   CHOL 91 12/15/2022 0336   TRIG 39 12/15/2022 0336   HDL 43 12/15/2022  0336   CHOLHDL 2.1 12/15/2022 0336   VLDL 8 12/15/2022 0336   LDLCALC 40 12/15/2022 0336     Risk Assessment/Calculations:    CHA2DS2-VASc Score = 3   This indicates a 3.2% annual risk of stroke. The patient's score is based upon: CHF History: 0 HTN History: 0 Diabetes History: 0 Stroke History: 2 Vascular Disease History: 0 Age Score: 1 Gender Score: 0           Physical Exam:    VS:  BP 100/70   Pulse (!) 50   Ht 5\' 11"  (1.803 m)   Wt 210 lb (95.3 kg)   SpO2 97%   BMI 29.29 kg/m     Wt Readings from Last 3 Encounters:  01/01/23 210 lb (95.3 kg)   12/29/22 210 lb 12.8 oz (95.6 kg)  12/15/22 212 lb 1.3 oz (96.2 kg)     GEN:  Well nourished, well developed in no acute distress HEENT: Normal NECK: No JVD LYMPHATICS: No lymphadenopathy CARDIAC: RRR, no murmurs, rubs, gallops RESPIRATORY:  Clear to auscultation without rales, wheezing or rhonchi  ABDOMEN: Soft, non-tender, non-distended MUSCULOSKELETAL:  No edema; No deformity  SKIN: Warm and dry NEUROLOGIC:  Alert and oriented x 3 PSYCHIATRIC:  Normal affect   ASSESSMENT:    1. S/P percutaneous patent foramen ovale closure   2. PAF (paroxysmal atrial fibrillation) (HCC)   3. Aortic root aneurysm (HCC)   4. History of CVA (cerebrovascular accident)    PLAN:    In order of problems listed above:  PFO s/p PFO closure: doing well 1 month out from closure. DAPT discontinued when started on Eliquis for PAF. He understands the need for SBE prophylaxis x 6months. I will see him back for 1 year follow up with echo/bubble.  PAF: CHADS2Vasc score is at least 3, started on eliquis 5mg  bid. Since there is a link between percutanous PFO closure and new onset of afib, occurring in <5% of patients with peak around 14 days after procedure, it was decided to treat with [facility] and potentially stop OAC if no recurrence. It is planned to placed a 30 day monitor 90 days after PFO closure.   TAA: CT angio chest 10/13/2022 showed thoracic aortic aneurysm measuring 4.2 cm. Plan for repeat imaging in 1 year.  Hx of CVA: continue on statin and Eliquis.    Medication Adjustments/Labs and Tests Ordered: Current medicines are reviewed at length with the patient today.  Concerns regarding medicines are outlined above.  Orders Placed This Encounter  Procedures   ECHOCARDIOGRAM LIMITED BUBBLE STUDY   No orders of the defined types were placed in this encounter.   Patient Instructions  Medication Instructions:  Your physician recommends that you continue on your current medications as directed. Please  refer to the Current Medication list given to you today.  *If you need a refill on your cardiac medications before your next appointment, please call your pharmacy*   Lab Work: None ordered   If you have labs (blood work) drawn today and your tests are completely normal, you will receive your results only by: MyChart Message (if you have MyChart) OR A paper copy in the mail If you have any lab test that is abnormal or we need to change your treatment, we will call you to review the results.   Testing/Procedures: Follow up echocardiogram as scheduled    Follow-Up: Follow up as scheduled   Other Instructions     Signed, Cline Crock, PA-C  01/01/2023 11:22  AM    [facility]

## 2023-01-01 ENCOUNTER — Ambulatory Visit: Payer: Medicare Other | Admitting: Physician Assistant

## 2023-01-01 VITALS — BP 100/70 | HR 50 | Ht 71.0 in | Wt 210.0 lb

## 2023-01-01 DIAGNOSIS — I480 Paroxysmal atrial fibrillation: Secondary | ICD-10-CM | POA: Diagnosis present

## 2023-01-01 DIAGNOSIS — Z8774 Personal history of (corrected) congenital malformations of heart and circulatory system: Secondary | ICD-10-CM | POA: Diagnosis present

## 2023-01-01 DIAGNOSIS — Z8673 Personal history of transient ischemic attack (TIA), and cerebral infarction without residual deficits: Secondary | ICD-10-CM | POA: Insufficient documentation

## 2023-01-01 DIAGNOSIS — I7121 Aneurysm of the ascending aorta, without rupture: Secondary | ICD-10-CM

## 2023-01-01 NOTE — Patient Instructions (Signed)
 Medication Instructions:  Your physician recommends that you continue on your current medications as directed. Please refer to the Current Medication list given to you today.  *If you need a refill on your cardiac medications before your next appointment, please call your pharmacy*   Lab Work: None ordered   If you have labs (blood work) drawn today and your tests are completely normal, you will receive your results only by: MyChart Message (if you have MyChart) OR A paper copy in the mail If you have any lab test that is abnormal or we need to change your treatment, we will call you to review the results.   Testing/Procedures: Follow up echocardiogram as scheduled    Follow-Up: Follow up as scheduled   Other Instructions

## 2023-01-06 ENCOUNTER — Ambulatory Visit: Payer: Medicare Other

## 2023-01-07 IMAGING — CT CT ANGIO CHEST
2 of 6 series · 13 of 36 positions shown · IV contrast (iopamidol)
Comparison: Prior CTA chest 10/15/2019 and 08/02/2018

CLINICAL DATA: 64-year-old male with a history of thoracic aortic
aneurysm

EXAM:
CT ANGIOGRAPHY CHEST WITH CONTRAST
TECHNIQUE: Multidetector CT imaging of the chest was performed using the
standard protocol during bolus administration of intravenous
contrast. Multiplanar CT image reconstructions and MIPs were
obtained to evaluate the vascular anatomy.
CONTRAST:  75mL MPRQ16-75U IOPAMIDOL (MPRQ16-75U) INJECTION 76%

[Series 5: cta thorax 2.00 bv36 s3 axial arterial · axial · arterial · 0.67mm/px · z∈[+1528,+1820]mm · 12 of 174 slices shown]
[im 14/174  lung]
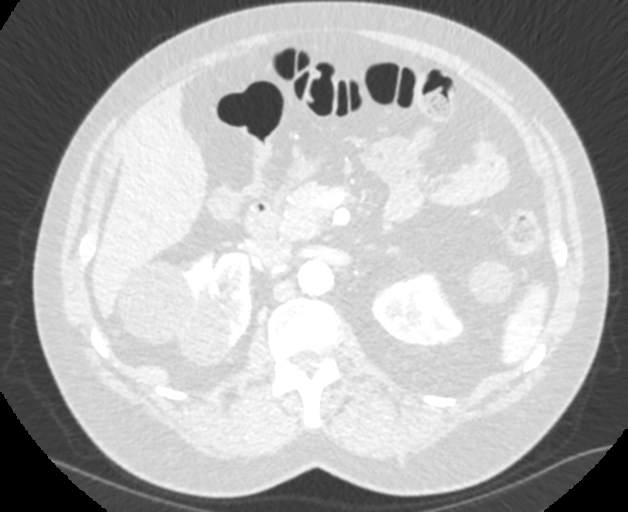
[im 27/174  mediastinal]
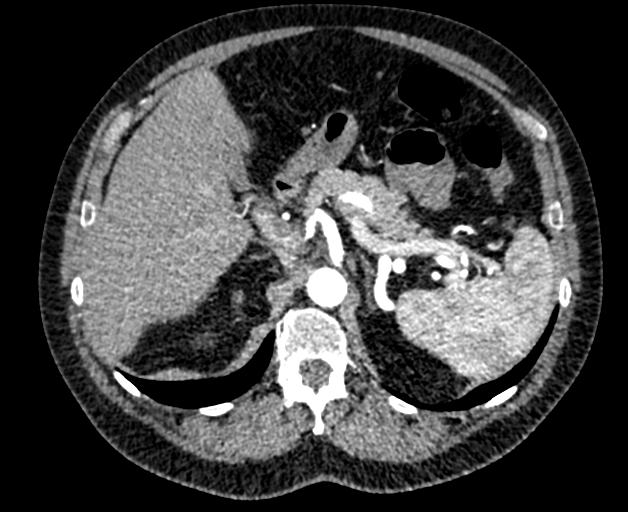
[im 40/174  lung]
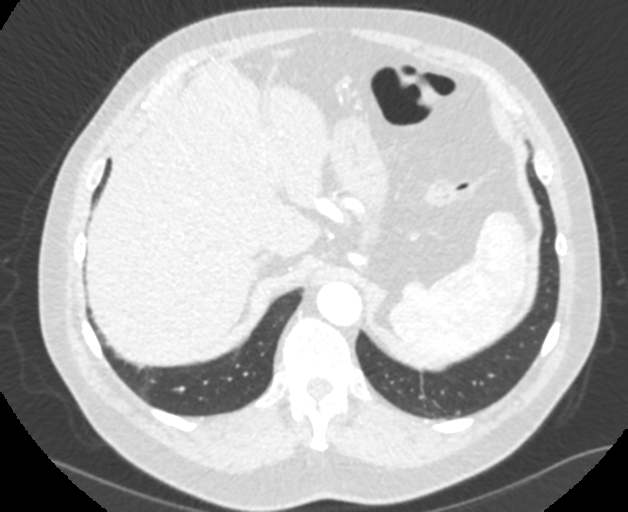
[im 54/174  mediastinal]
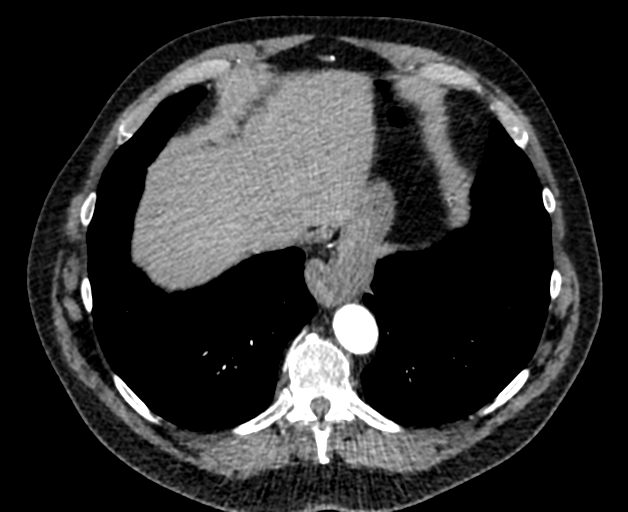
[im 67/174  lung]
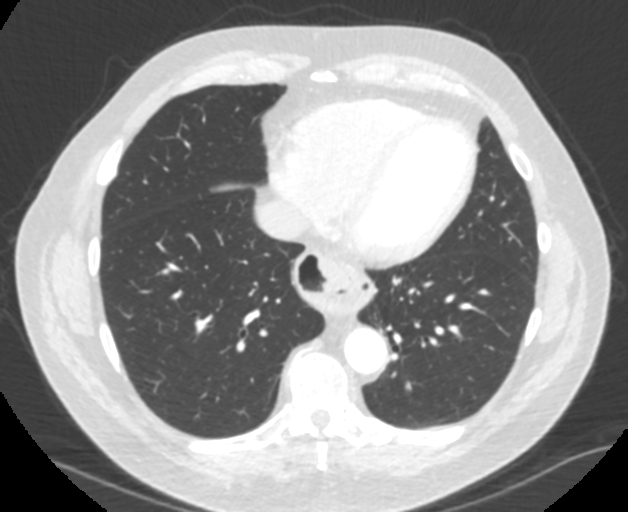
[im 80/174  mediastinal]
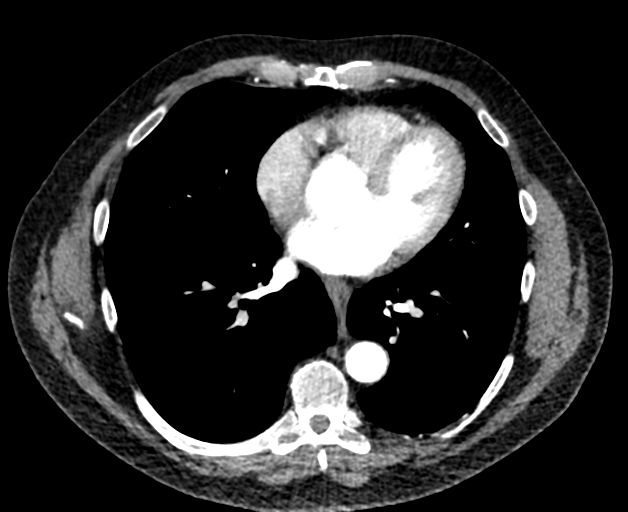
[im 94/174  lung]
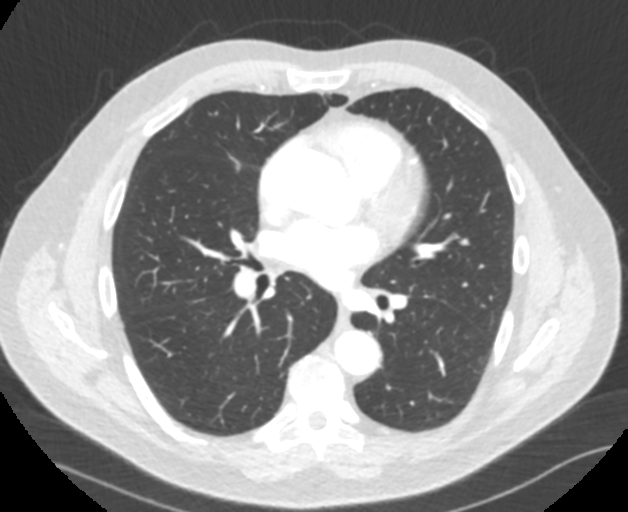
[im 107/174  mediastinal]
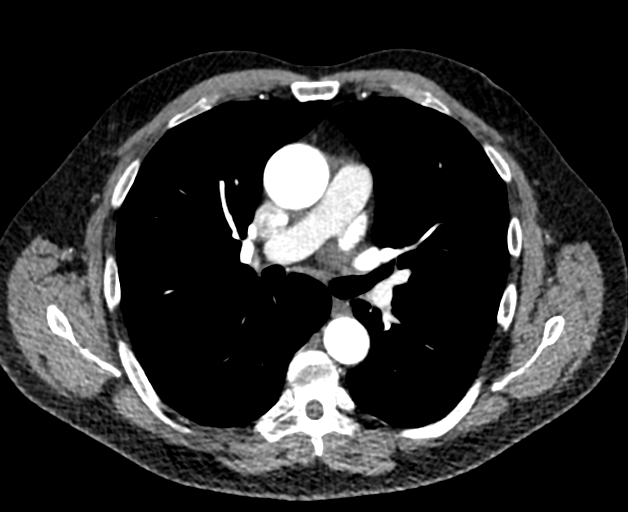
[im 120/174  lung]
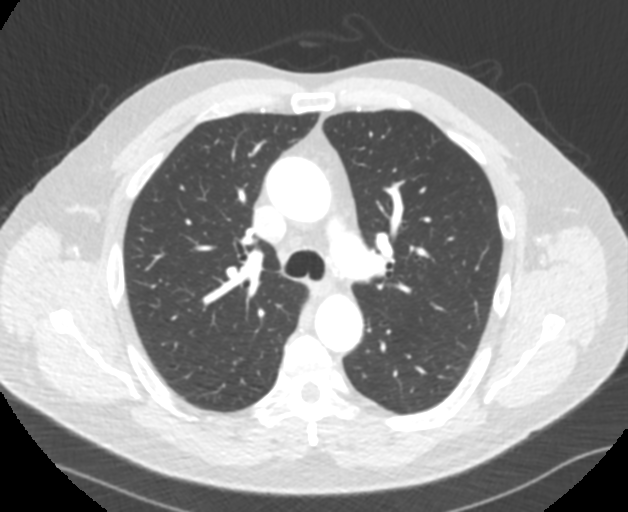
[im 134/174  mediastinal]
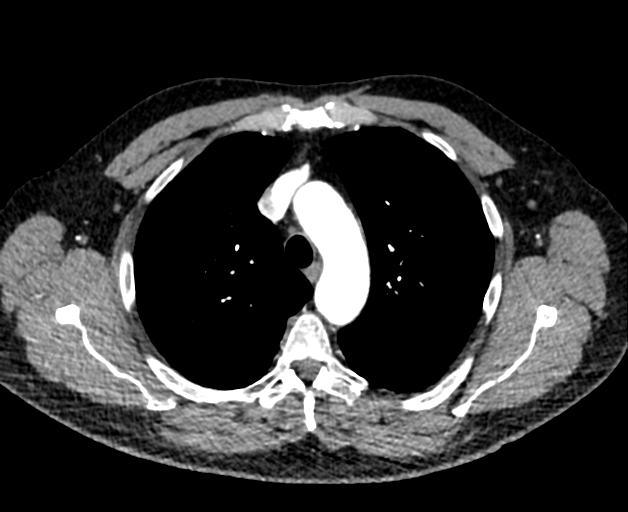
[im 147/174  lung]
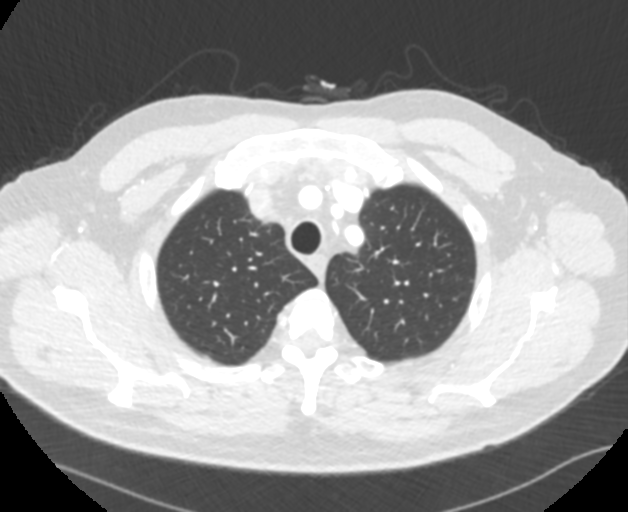
[im 160/174  mediastinal]
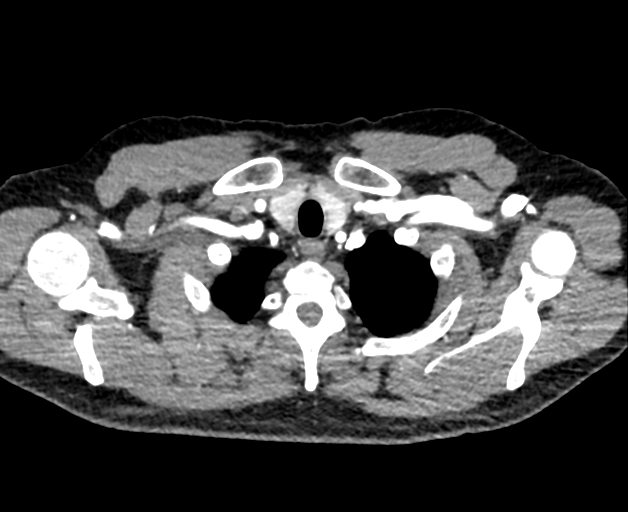

[Series 10: cta thorax 2.00 bv36 s3 cor st · coronal · 0.69mm/px · 1 of 171 slices shown]
[im 86/171  mediastinal]
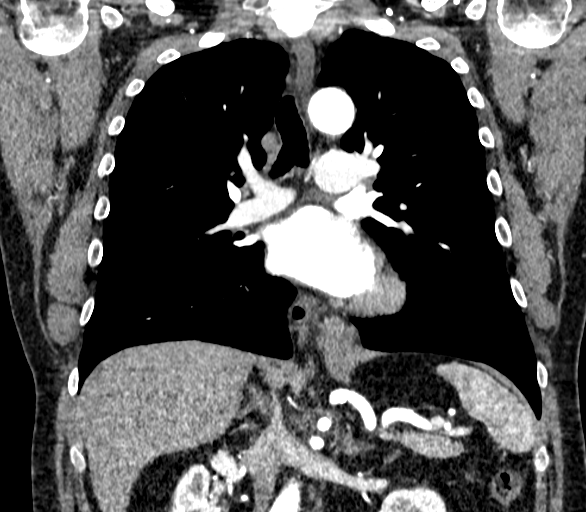

[13 of 36 positions shown; findings below may reference images not displayed]

FINDINGS: Cardiovascular: Stable mild fusiform aneurysmal dilation of the
ascending thoracic aorta with a maximal diameter of 4.3 cm
(confirmed on coronal and sagittal reformatted images. The aortic
root remains at the upper limits of normal at 4.5 cm. Normal
transverse and descending thoracic aorta. No significant
atherosclerotic plaque. Main pulmonary artery is normal in size. The
heart is normal in size. No pericardial effusion.

Mediastinum/Nodes: Unremarkable CT appearance of the thyroid gland.
No suspicious mediastinal or hilar adenopathy. No soft tissue
mediastinal mass. Moderately large hiatal hernia.

Lungs/Pleura: Lungs are clear. No pleural effusion or pneumothorax.

Upper Abdomen: Moderately large hiatal hernia. No acute abnormality.
Multifocal exophytic simple cysts arising from the kidneys.

Musculoskeletal: No acute fracture or aggressive appearing lytic or
blastic osseous lesion.

Review of the MIP images confirms the above findings.
IMPRESSION: 1. Stable 4.3 cm fusiform aneurysmal dilation of the ascending
thoracic aorta. Recommend annual imaging followup by CTA or MRA.
This recommendation follows 2171
ACCF/AHA/AATS/ACR/ASA/SCA/WANDA/MIRTA/LAOS/HI Guidelines for the
Diagnosis and Management of Patients with Thoracic Aortic Disease.
Circulation. 2171; 121: E266-e369. Aortic aneurysm NOS (FPI5X-EHQ.I)
2. Stable ectasia of the aortic root at 4.5 cm.
3. Moderately large hiatal hernia.
4. Multifocal bilateral renal cysts.

## 2023-01-14 ENCOUNTER — Ambulatory Visit: Payer: Medicare Other | Admitting: General Practice

## 2023-02-10 ENCOUNTER — Encounter: Payer: Self-pay | Admitting: Cardiovascular Disease

## 2023-02-10 ENCOUNTER — Ambulatory Visit: Payer: Medicare Other | Attending: General Practice | Admitting: Cardiovascular Disease

## 2023-02-10 VITALS — BP 108/70 | HR 61 | Ht 71.0 in | Wt 214.4 lb

## 2023-02-10 DIAGNOSIS — Q2112 Patent foramen ovale: Secondary | ICD-10-CM | POA: Insufficient documentation

## 2023-02-10 DIAGNOSIS — I7121 Aneurysm of the ascending aorta, without rupture: Secondary | ICD-10-CM | POA: Insufficient documentation

## 2023-02-10 DIAGNOSIS — I480 Paroxysmal atrial fibrillation: Secondary | ICD-10-CM | POA: Insufficient documentation

## 2023-02-10 NOTE — Addendum Note (Signed)
Addended by: Bernita Buffy on: 02/10/2023 11:15 AM   Modules accepted: Orders

## 2023-02-10 NOTE — Patient Instructions (Addendum)
Medication Instructions:  Your physician recommends that you continue on your current medications as directed. Please refer to the Current Medication list given to you today.  *If you need a refill on your cardiac medications before your next appointment, please call your pharmacy*    Testing/Procedures:  To start Tuesday, October 1st  [employer] Cardiac Event Monitor Instructions  Your physician has requested you wear your cardiac event monitor for 30 days. [employer] may call or text to confirm a shipping address. The monitor will be sent to a land address via UPS. [employer] will not ship a monitor to a PO BOX. It typically takes 3-5 days to receive your monitor after it has been enrolled. [employer] will assist with USPS tracking if your package is delayed. The telephone number for [employer] is [phone]. Once you have received your monitor, please review the enclosed instructions. Instruction tutorials can also be viewed under help and settings on the enclosed cell phone. Your monitor has already been registered assigning a specific monitor serial # to you.  Billing and Self Pay Discount Information  [employer] has been provided the insurance information we had on file for you.  If your insurance has been updated, please call [employer] at [phone] to provide them with your updated insurance information.   [employer] offers a discounted Self Pay option for patients who have insurance that does not cover their cardiac event monitor or patients without insurance.  The discounted cost of a Self Pay Cardiac Event Monitor would be $225.00 , if the patient contacts [employer] at [phone] within 7 days of applying the monitor to make payment arrangements.  If the patient does not contact [employer] within 7 days of applying the monitor, the cost of the cardiac event monitor will be $350.00.  Applying the monitor  Remove cell phone from case and turn it on. The cell phone  works as IT consultant and needs to be within [employer] of you at all times. The cell phone will need to be charged on a daily basis. We recommend you plug the cell phone into the enclosed charger at your bedside table every night.  Monitor batteries: You will receive two monitor batteries labelled #1 and #2. These are your recorders. Plug battery #2 onto the second connection on the enclosed charger. Keep one battery on the charger at all times. This will keep the monitor battery deactivated. It will also keep it fully charged for when you need to switch your monitor batteries. A small light will be blinking on the battery emblem when it is charging. The light on the battery emblem will remain on when the battery is fully charged.  Open package of a Monitor strip. Insert battery #1 into black hood on strip and gently squeeze monitor battery onto connection as indicated in instruction booklet. Set aside while preparing skin.  Choose location for your strip, vertical or horizontal, as indicated in the instruction booklet. Shave to remove all hair from location. There cannot be any lotions, oils, powders, or colognes on skin where monitor is to be applied. Wipe skin clean with enclosed Saline wipe. Dry skin completely.  Peel paper labeled #1 off the back of the Monitor strip exposing the adhesive. Place the monitor on the chest in the vertical or horizontal position shown in the instruction booklet. One arrow on the monitor strip must be pointing upward. Carefully remove paper labeled #2, attaching remainder of strip to your skin. Try not to create any folds or wrinkles in the strip  as you apply it.  Firmly press and release the circle in the center of the monitor battery. You will hear a small beep. This is turning the monitor battery on. The heart emblem on the monitor battery will light up every 5 seconds if the monitor battery in turned on and connected to the patient securely. Do not push  and hold the circle down as this turns the monitor battery off. The cell phone will locate the monitor battery. A screen will appear on the cell phone checking the connection of your monitor strip. This may read poor connection initially but change to good connection within the next minute. Once your monitor accepts the connection you will hear a series of 3 beeps followed by a climbing crescendo of beeps. A screen will appear on the cell phone showing the two monitor strip placement options. Touch the picture that demonstrates where you applied the monitor strip.  Your monitor strip and battery are waterproof. You are able to shower, bathe, or swim with the monitor on. They just ask you do not submerge deeper than 3 feet underwater. We recommend removing the monitor if you are swimming in a lake, river, or ocean.  Your monitor battery will need to be switched to a fully charged monitor battery approximately once a week. The cell phone will alert you of an action which needs to be made.  On the cell phone, tap for details to reveal connection status, monitor battery status, and cell phone battery status. The green dots indicates your monitor is in good status. A red dot indicates there is something that needs your attention.  To record a symptom, click the circle on the monitor battery. In 30-60 seconds a list of symptoms will appear on the cell phone. Select your symptom and tap save. Your monitor will record a sustained or significant arrhythmia regardless of you clicking the button. Some patients do not feel the heart rhythm irregularities. [employer] will notify us of any serious or critical events.  Refer to instruction booklet for instructions on switching batteries, changing strips, the Do not disturb or Pause features, or any additional questions.  Call [employer] at [phone], to confirm your monitor is transmitting and record your baseline. They will answer any questions you  may have regarding the monitor instructions at that time.  Returning the monitor to [employer]  Place all equipment back into blue box. Peel off strip of paper to expose adhesive and close box securely. There is a prepaid UPS shipping label on this box. Drop in a UPS drop box, or at a UPS facility like [employer]. You may also contact [employer] to arrange UPS to pick up monitor package at your home.    Follow-Up: At [facility], you and your health needs are our priority.  As part of our continuing mission to provide you with exceptional heart care, we have created designated Provider Care Teams.  These Care Teams include your primary Cardiologist (physician) and Advanced Practice Providers (APPs -  Physician Assistants and Nurse Practitioners) who all work together to provide you with the care you need, when you need it.  We recommend signing up for the patient portal called "MyChart".  Sign up information is provided on this After Visit Summary.  MyChart is used to connect with patients for Virtual Visits (Telemedicine).  Patients are able to view lab/test results, encounter notes, upcoming appointments, etc.  Non-urgent messages can be sent to your provider as well.   To learn more  about what you can do with MyChart, go to [email or web address].    Your next appointment:   12 month(s)  Provider:   Nanetta Batty, MD

## 2023-02-10 NOTE — Assessment & Plan Note (Signed)
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  Currently on Eliquis oral anticoagulation.  Will check a 30-day event monitor in October and if he has no recurrence we will stop his Eliquis.

## 2023-02-10 NOTE — Assessment & Plan Note (Signed)
History of prior stroke in [place] 09/28/2022.  He did have a TEE performed by Dr.Chandrasekhar 10/26/2022 which showed a PFO and he ultimately underwent successful PFO closure by Dr. Excell Seltzer 12/04/2022.  He had brief PAF afterwards and was placed on Eliquis.  He is scheduled to see Carlean Jews back in the office in July 2025 at which time he will have a 2D echo with bubble study.

## 2023-02-10 NOTE — Assessment & Plan Note (Signed)
Ascending thoracic aortic aneurysm followed by Dr. Dorris Fetch measuring 4.2 cm by CTA 10/13/2022

## 2023-02-10 NOTE — Progress Notes (Signed)
02/10/2023 Marvin Harvey   12-18-1955  [id number]  Primary Physician Jonathon Bellows, DO Primary Cardiologist: Runell Gess MD Milagros Loll, [place], [place]  HPI:  Marvin Harvey is a 67 y.o.  mild to moderately overweight married Caucasian male father of 1, grandfather of 2 grandchildren who is a Clinical research associate and  who I last saw in the office 11/04/2022.  He was being seen for his first post hospital outpatient follow-up after being admitted 01/27/2018 in transfer from [facility] for chest pain.  I done a cardiac catheterization on him 01/26/2018 revealing normal coronary arteries.  He was seen by cardiologist there and is had his aortic valve followed by echo periodically.  He recently had a CTA that showed a 5.2 cm aortic aneurysm.  His father apparently also had a thoracic aortic aneurysm and had this repaired along with an AVR in his 9s.  He has no other cardiac risk factors.   He is followed by Dr. Cliffton Asters for a small ascending thoracic aortic aneurysm measuring 4.7 cm at the root and 4.3 cm in the ascending aorta.    He does have obstructive sleep apnea on CPAP and has been seen by Dr. Mayford Knife to explore the inspire device however his sleep study was indeterminant.   He unfortunately unfortunately suffered a stroke in [place] 09/28/2022, discharged home the following day.  Workup there was unrevealing including CT of the head, CTA of the neck.  He did wear a 2-week Zio patch however the results are unavailable to me.  He underwent transesophageal echocardiography by Dr.Chandrasekhar 10/26/2022 which showed a PFO and scheduled to see Dr. Excell Seltzer in the office on 11/12/2022 for gets consideration of PFO closure.  He ultimately underwent PFO closure by Dr. Excell Seltzer 12/04/2022 successfully.  He did have a brief PAF several weeks later which resolved spontaneously and was placed on Eliquis.  Current Meds  Medication Sig   allopurinol (ZYLOPRIM) 300 MG tablet Take 450 mg by mouth every  evening.    apixaban (ELIQUIS) 5 MG TABS tablet Take 1 tablet (5 mg total) by mouth 2 (two) times daily.   atorvastatin (LIPITOR) 40 MG tablet Take 40 mg by mouth at bedtime.   Coenzyme Q10 100 MG TABS Take 100 mg by mouth in the morning.   levocetirizine (XYZAL) 5 MG tablet Take 5 mg by mouth daily as needed for allergies.   LEXAPRO 10 MG tablet Take 10 mg by mouth in the morning.   metoprolol tartrate (LOPRESSOR) 25 MG tablet Take 0.5 tablets (12.5 mg total) by mouth 2 (two) times daily.   triamcinolone (NASACORT ALLERGY 24HR) 55 MCG/ACT AERO nasal inhaler Place 1 spray into the nose daily as needed (congestion/allergies.).     No Known Allergies  Social History   Socioeconomic History   Marital status: Married    Spouse name: Not on file   Number of children: Not on file   Years of education: Not on file   Highest education level: Not on file  Occupational History   Not on file  Tobacco Use   Smoking status: Never   Smokeless tobacco: Never  Vaping Use   Vaping status: Never Used  Substance and Sexual Activity   Alcohol use: Never   Drug use: Never   Sexual activity: Not on file  Other Topics Concern   Not on file  Social History Narrative   Are you right handed or left handed? Right   Are you currently employed ? Yes  What is your current occupation? [name]   Do you live at home alone? Yes   Who lives with you?  Wife   What type of home do you live in: 1 story or 2 story? 2       Social Determinants of Health   Financial Resource Strain: Not on file  Food Insecurity: No Food Insecurity (12/15/2022)   Hunger Vital Sign    Worried About Running Out of Food in the Last Year: Never true    Ran Out of Food in the Last Year: Never true  Transportation Needs: No Transportation Needs (12/15/2022)   PRAPARE - Administrator, Civil Service (Medical): No    Lack of Transportation (Non-Medical): No  Physical Activity: Not on file  Stress: Not on file  Social  Connections: Not on file  Intimate Partner Violence: Not At Risk (12/15/2022)   Humiliation, Afraid, Rape, and Kick questionnaire    Fear of Current or Ex-Partner: No    Emotionally Abused: No    Physically Abused: No    Sexually Abused: No     Review of Systems: General: negative for chills, fever, night sweats or weight changes.  Cardiovascular: negative for chest pain, dyspnea on exertion, edema, orthopnea, palpitations, paroxysmal nocturnal dyspnea or shortness of breath Dermatological: negative for rash Respiratory: negative for cough or wheezing Urologic: negative for hematuria Abdominal: negative for nausea, vomiting, diarrhea, bright red blood per rectum, melena, or hematemesis Neurologic: negative for visual changes, syncope, or dizziness All other systems reviewed and are otherwise negative except as noted above.    Blood pressure 108/70, pulse 61, height 5\' 11"  (1.803 m), weight 214 lb 6.4 oz (97.3 kg), SpO2 97%.  General appearance: alert and no distress Neck: no adenopathy, no carotid bruit, no JVD, supple, symmetrical, trachea midline, and thyroid not enlarged, symmetric, no tenderness/mass/nodules Lungs: clear to auscultation bilaterally Heart: regular rate and rhythm, S1, S2 normal, no murmur, click, rub or gallop Extremities: extremities normal, atraumatic, no cyanosis or edema Pulses: 2+ and symmetric Skin: Skin color, texture, turgor normal. 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  Currently on Eliquis oral anticoagulation.  Will check a 30-day event monitor in October and if he has no recurrence we will stop his Eliquis.  PFO (patent foramen ovale) History of prior stroke in  [place] 09/28/2022.  He did have a TEE performed by Dr.Chandrasekhar 10/26/2022 which showed a PFO and he ultimately underwent successful PFO closure by Dr. Excell Seltzer 12/04/2022.  He had brief PAF afterwards and was placed on Eliquis.  He is scheduled to see Carlean Jews back in the office in July 2025 at which time he will have a 2D echo with bubble study.     Runell Gess MD FACP,FACC,FAHA, [facility] 02/10/2023 11:07 AM

## 2023-02-16 DIAGNOSIS — Q2112 Patent foramen ovale: Secondary | ICD-10-CM

## 2023-02-16 DIAGNOSIS — I480 Paroxysmal atrial fibrillation: Secondary | ICD-10-CM

## 2023-02-16 DIAGNOSIS — Q2543 Congenital aneurysm of aorta: Secondary | ICD-10-CM

## 2023-03-23 ENCOUNTER — Ambulatory Visit: Payer: Medicare Other | Attending: Cardiovascular Disease

## 2023-03-23 DIAGNOSIS — Q2112 Patent foramen ovale: Secondary | ICD-10-CM

## 2023-03-23 DIAGNOSIS — I480 Paroxysmal atrial fibrillation: Secondary | ICD-10-CM

## 2023-03-23 DIAGNOSIS — Q2543 Congenital aneurysm of aorta: Secondary | ICD-10-CM

## 2023-04-04 NOTE — Progress Notes (Unsigned)
Cardiology Clinic Note   Patient Name: Marvin Harvey Date of Encounter: 04/07/2023  Primary Care Provider:  Jonathon Bellows, DO Primary Cardiologist:  Nanetta Batty, MD  Patient Profile    Marvin Harvey 67 year old male presents to the clinic today for follow-up evaluation of his atrial fibrillation and ascending aortic aneurysm.  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   4.7 centimeters at sinuses of Valsalva, 4.2 cm mid ascending   Arthritis    Cataract    BILATERAL   GERD (gastroesophageal reflux disease)    History of repair of hiatal hernia 2001   Sleep apnea    On CPAP   Past Surgical History:  Procedure Laterality Date   CLOSED REDUCTION HAND FRACTURE Right    COLONOSCOPY     LEFT HEART CATH AND CORS/GRAFTS ANGIOGRAPHY N/A 01/26/2018   Procedure: LEFT HEART CATH AND CORS/GRAFTS ANGIOGRAPHY;  Surgeon: Runell Gess, MD;  Location: [facility] INVASIVE CV LAB;  Service: Cardiovascular;  Laterality: N/A;   NISSEN FUNDOPLICATION  2004   Complete in [place], [place]   PATENT FORAMEN OVALE(PFO) CLOSURE N/A 12/04/2022   Procedure: PATENT FORAMEN OVALE(PFO) CLOSURE;  Surgeon: [name], MD;  Location: [facility] INVASIVE CV LAB;  Service: Cardiovascular;  Laterality: N/A;   POLYPECTOMY     TEE WITHOUT CARDIOVERSION N/A 10/26/2022   Procedure: TRANSESOPHAGEAL ECHOCARDIOGRAM;  Surgeon: Christell Constant, MD;  Location: [facility] INVASIVE CV LAB;  Service: Cardiovascular;  Laterality: N/A;    Allergies  No Known Allergies  History of Present Illness    Marvin Harvey has a PMH of-both aortic root aneurysm, GERD, chest pain, gout, and CVA.  He underwent cardiac catheterization 01/26/2018 which showed normal coronary arteries.  After that he followed up with cardiology for echocardiograms to monitor his aortic valve.  His CTA showed a 5.2 cm aortic aneurysm.  He reported that his father had thoracic aortic  aneurysm which was repaired in his 71s.  He was seen by Dr. 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  He denied chest pain and shortness of breath. He was seen and evaluated by cardiology 5/22.  He was seen in follow-up by Dr. Allyson Sabal on 02/25/2022.  At that time he reported he wished to be evaluated for inspire device.  He was admitted to the hospital 09/28/2022 and discharged on 09/29/2022.  He was diagnosed with acute CVA.  He was instructed to follow-up with neurology in 3 to 4 weeks.  He was placed on aspirin and Plavix.  He was instructed to continue Plavix for 21 days and then continue aspirin thereafter.  He was started on a atorvastatin and Lexapro.  He contacted the nurse triage line and requested cardiology appointment.  He reported that he had been told he had a hole in his heart.  From diagnostic testing done at [facility] I see no evidence of echocardiogram.  Lower extremity ultrasound showed no DVT.  Chest x-ray was negative.  CT brain showed no large vessel occlusion, no target or acute endovascular stroke intervention.  Multilevel degenerative changes of the C-spine, and age-indeterminate infarcts of the right cerebellum and left caudate nucleus.  CT angio chest aorta 10/13/2022 showed stable ascending thoracic aortic aneurysm measuring 4.2 cm.  Stable moderate large hiatal hernia.  Recommendation for annual CTA or MRA was planned.  He presented  to the clinic 10/16/22  for follow-up evaluation and stated he was concerned for PFO after recent echocardiogram during admission for CVA.  We reviewed his recent hospital admission.  I did not have echocardiogram for review at the time.  Case discussed with DOD.  Recommendation for TEE and referral to structural heart if PFO closure is needed was discussed.  His EKG  showed sinus bradycardia with first-degree AV block.  Blood pressure well-controlled at 118/75.  I also  requested results from cardiac event monitor.  CT showed no carotid plaque.  Lower extremity Dopplers negative for DVT.  We reviewed his most recent CT angio chest.  He and his wife expressed understanding.  I planned follow-up in 3 to 4 months.  He was admitted on 12/15/2022 and discharged on 12/17/2022.  He underwent PFO closure on 12/04/2022.  Cardiology was consulted on 12/15/2022 for evaluation of atrial fibrillation which was rate controlled.  This was new atrial fibrillation.  He was continued on metoprolol 37.5 mg twice daily.  His blood pressure was well-controlled.  His heart rates were in the 80s.  His CHA2DS2-VASc score was noted to be 3 and he was started on apixaban 5 mg twice daily.  For his PFO closure by Dr. Excell Seltzer it was recommended that he continue aspirin and Plavix for 6 months however with treatment of apixaban it was felt that he would be okay to discontinue aspirin and Plavix.  He presented to the clinic 12/29/22 for follow-up evaluation and stated he had been doing well.  He had been monitoring his heart rate and blood pressure.  He felt he converted to sinus rhythm on 7-29.  At that time he reduced his metoprolol because his heart rate decreased.  His blood pressure was 106/84.  His pulse was 60.  We reviewed the importance of continuing anticoagulation and rate control.  He had a follow-up with the structural heart team on Friday.  I planned to have him wear a cardiac event monitor for 30 days around 90 days post PFO closure to verify that he was not having any further atrial fibrillation.  I asked him to keep his follow-up with Dr. Allyson Sabal in September and planned to see him back at the first part of December.  He was seen in follow-up by Dr. Allyson Sabal on 02/10/2023.  A 30-day cardiac event monitor was planned for monitoring of atrial fibrillation.  Cardiac event monitor showed sinus rhythm, sinus bradycardia, sinus tach and occasional PACs/PVCs.  He presents to the clinic today for follow-up  evaluation and states he had 2 episodes of accelerated heart rate.  1 episode happened after falling asleep while not wearing CPAP.  The other episode happened during travel.  We reviewed his cardiac event monitor.  He expressed understanding.  I will stop his apixaban and resume Plavix and aspirin.  We reviewed importance of continuing this for 6 months post PFO closure.  He has noted some weakness in his lower extremities.  I will decrease his atorvastatin to 20 mg daily.  He has only been taking metoprolol tartrate daily.  I will transition to metoprolol succinate 12.5 mg.  We reviewed his upcoming CT.  We will plan repeat in 6 months.  Today he denies chest pain, shortness of breath, lower extremity edema, fatigue, palpitations, melena, hematuria, hemoptysis, diaphoresis, weakness, presyncope, syncope, orthopnea, and PND.    Home Medications    Prior to Admission medications   Medication Sig Start Date End Date Taking? Authorizing Provider  allopurinol (ZYLOPRIM)  300 MG tablet Take 450 mg by mouth every evening.  11/01/17   [provider]  aspirin EC 81 MG tablet Take 81 mg by mouth daily.    [provider]  atorvastatin (LIPITOR) 40 MG tablet Take 40 mg by mouth daily.    [provider]  co-enzyme Q-10 30 MG capsule Take 30 mg by mouth 3 (three) times daily.    [provider]  fexofenadine (ALLEGRA) 60 MG tablet Take 60 mg by mouth as needed for allergies or rhinitis.    [provider]  levocetirizine (XYZAL) 5 MG tablet Take 5 mg by mouth every evening.    [provider]  LEXAPRO 5 MG tablet Take by mouth. 09/29/22   [provider]  Multiple Vitamins-Minerals (MULTIVITAMIN WITH MINERALS) tablet Take 1 tablet by mouth daily.    [provider]  OVER THE COUNTER MEDICATION Nasocort nasal spray, as needed for allergies.    [provider]  PLAVIX 75 MG tablet Take by mouth. 09/29/22   [provider]     Family History    Family History  Problem Relation Age of Onset   Atrial fibrillation Mother    Transient ischemic attack Mother    Colon polyps Father    Coronary artery disease Father        s/p CABGx3, Valve replacement   Aortic aneurysm Father        repaired   Colon cancer Neg Hx    Esophageal cancer Neg Hx    Rectal cancer Neg Hx    Stomach cancer Neg Hx    Liver disease Neg Hx    He indicated that his mother is deceased. He indicated that his father is deceased. He indicated that his maternal grandmother is deceased. He indicated that his maternal grandfather is deceased. He indicated that his paternal grandmother is deceased. He indicated that his paternal grandfather is deceased. He indicated that the status of his neg hx is unknown.  Social History    Social History   Socioeconomic History   Marital status: Married    Spouse name: Not on file   Number of children: Not on file   Years of education: Not on file   Highest education level: Not on file  Occupational History   Not on file  Tobacco Use   Smoking status: Never   Smokeless tobacco: Never  Vaping Use   Vaping status: Never Used  Substance and Sexual Activity   Alcohol use: Never   Drug use: Never   Sexual activity: Not on file  Other Topics Concern   Not on file  Social History Narrative   Are you right handed or left handed? Right   Are you currently employed ? Yes   What is your current occupation? [name]   Do you live at home alone? Yes   Who lives with you?  Wife   What type of home do you live in: 1 story or 2 story? 2       Social Determinants of Health   Financial Resource Strain: Not on file  Food Insecurity: No Food Insecurity (12/15/2022)   Hunger Vital Sign    Worried About Running Out of Food in the Last Year: Never true    Ran Out of Food in the Last Year: Never true  Transportation Needs: No Transportation Needs (12/15/2022)   PRAPARE - Scientist, research (physical sciences) (Medical): No    Lack of Transportation (Non-Medical):  No  Physical Activity: Not on file  Stress: Not on file  Social Connections: Not on file  Intimate Partner Violence: Not At Risk (12/15/2022)   Humiliation, Afraid, Rape, and Kick questionnaire    Fear of Current or Ex-Partner: No    Emotionally Abused: No    Physically Abused: No    Sexually Abused: No     Review of Systems    General:  No chills, fever, night sweats or weight changes.  Cardiovascular:  No chest pain, dyspnea on exertion, edema, orthopnea, palpitations, paroxysmal nocturnal dyspnea. Dermatological: No rash, lesions/masses Respiratory: No cough, dyspnea Urologic: No hematuria, dysuria Abdominal:   No nausea, vomiting, diarrhea, bright red blood per rectum, melena, or hematemesis Neurologic:  No visual changes, wkns, changes in mental status. All other systems reviewed and are otherwise negative except as noted above.  Physical Exam    VS:  BP 130/68 (BP Location: Left Arm, Patient Position: Sitting, Cuff Size: Normal)   Pulse (!) 54   Ht 5\' 11"  (1.803 m)   Wt 220 lb (99.8 kg)   BMI 30.68 kg/m  , BMI Body mass index is 30.68 kg/m. GEN: Well nourished, well developed, in no acute distress. HEENT: normal. Neck: Supple, no JVD, carotid bruits, or masses. Cardiac: RRR, no murmurs, rubs, or gallops. No clubbing, cyanosis, edema.  Radials/DP/PT 2+ and equal bilaterally.  Respiratory:  Respirations regular and unlabored, clear to auscultation bilaterally. GI: Soft, nontender, nondistended, BS + x 4. MS: no deformity or atrophy. Skin: warm and dry, no rash. Neuro:  Strength and sensation are intact. Psych: Normal affect.  Accessory Clinical Findings    Recent Labs: 12/14/2022: TSH 1.953 12/15/2022: ALT 32 12/17/2022: BUN 12; Creatinine, Ser 1.00; Hemoglobin 15.3; Magnesium 1.9; Platelets 142; Potassium 4.4; Sodium 139   Recent Lipid Panel    Component Value Date/Time   CHOL 91 12/15/2022  0336   TRIG 39 12/15/2022 0336   HDL 43 12/15/2022 0336   CHOLHDL 2.1 12/15/2022 0336   VLDL 8 12/15/2022 0336   LDLCALC 40 12/15/2022 0336         ECG personally reviewed by me today-none today.    EKG 10/16/2022 sinus bradycardia with first-degree AV block PR interval 220 ms- No acute changes   Echocardiogram 08/31/2017 Study Conclusions   - Left ventricle: The cavity size was normal. Wall thickness was    normal. Systolic function was vigorous. The estimated ejection    fraction was in the range of 65% to 70%. Wall motion was normal;    there were no regional wall motion abnormalities. Features are    consistent with a pseudonormal left ventricular filling pattern,    with concomitant abnormal relaxation and increased filling    pressure (grade 2 diastolic dysfunction).  - Aortic valve: There was mild regurgitation. Valve area (VTI):    2.84 cm^2. Valve area (Vmax): 2.89 cm^2. Valve area (Vmean): 2.96    cm^2.   -------------------------------------------------------------------  Study data:  No prior study was available for comparison.  Study  status:  Routine.  Procedure:  The patient reported no pain pre or  post test. Transthoracic echocardiography. Image quality was  adequate.  Study completion:  There were no complications.  Echocardiography.  M-mode, complete 2D, spectral Doppler, and color  Doppler.  Birthdate:  Patient birthdate: 1955-12-31.  Age:  Patient  is 67 yr old.  Sex:  Gender: male.    BMI: 29 kg/m^2.  Blood  pressure:     117/79  Patient status:  Inpatient.  Study date:  Study date: 01/26/2018. Study time: 08:33 AM.  Location:  Bedside.    -------------------------------------------------------------------   -------------------------------------------------------------------  Left ventricle:  The cavity size was normal. Wall thickness was  normal. Systolic function was vigorous. The estimated ejection  fraction was in the range of 65% to 70%. Wall  motion was normal;  there were no regional wall motion abnormalities. Features are  consistent with a pseudonormal left ventricular filling pattern,  with concomitant abnormal relaxation and increased filling pressure  (grade 2 diastolic dysfunction).   -------------------------------------------------------------------  Aortic valve:   The valve appears to be grossly normal.    Doppler:    There was no stenosis.   There was mild regurgitation.    VTI  ratio of LVOT to aortic valve: 0.9. Valve area (VTI): 2.84 cm^2.  Indexed valve area (VTI): 1.3 cm^2/m^2. Peak velocity ratio of LVOT  to aortic valve: 0.92. Valve area (Vmax): 2.89 cm^2. Indexed valve  area (Vmax): 1.32 cm^2/m^2. Mean velocity ratio of LVOT to aortic  valve: 0.94. Valve area (Vmean): 2.96 cm^2. Indexed valve area  (Vmean): 1.35 cm^2/m^2.    Mean gradient (S): 3 mm Hg. Peak  gradient (S): 5 mm Hg.   -------------------------------------------------------------------  Aorta: Ascending aorta: The ascending aorta was mildly dilated.   -------------------------------------------------------------------  Mitral valve:   The valve appears to be grossly normal.    Doppler:   There was no significant regurgitation.    Valve area by pressure  half-time: 3.06 cm^2. Indexed valve area by pressure half-time: 1.4  cm^2/m^2.    Peak gradient (D): 2 mm Hg.   -------------------------------------------------------------------  Left atrium:  The atrium was normal in size.   -------------------------------------------------------------------  Right ventricle:  The cavity size was normal. Systolic function was  normal.   -------------------------------------------------------------------  Pulmonic valve:    The valve appears to be grossly normal.  Doppler:  There was no significant regurgitation.   -------------------------------------------------------------------  Tricuspid valve:   The valve appears to be grossly normal.   Doppler:  There was trivial regurgitation.   -------------------------------------------------------------------  Pulmonary artery:   Systolic pressure was within the normal range.    -------------------------------------------------------------------  Pericardium: There was no pericardial effusion.   -------------------------------------------------------------------  Systemic veins:  Inferior vena cava: The vessel was normal in size. The  respirophasic diameter changes were in the normal range (>= 50%),  consistent with normal central venous pressure.     Assessment & Plan   1.   Atrial fibrillation-reports to have labs of elevated heart rate.  One episode was after falling asleep when not wearing CPAP and the other episode was noted during travel.Marland Kitchen  Heart rate today 54.  Cardiac event monitor showed no recurrent atrial fibrillation.  Details above.  Occurrence noted noted post PFO closure.   Change metoprolol to tartrate to metoprolol succinate 12.5 mg May stop Eliquis-will need to start aspirin and Plavix. Avoid triggers caffeine, chocolate, EtOH, dehydration etc.  Aortic aneurysm-denies recent episodes of chest and back pain.  CT angio chest 10/13/2022 showed thoracic aortic aneurysm measuring 4.2 cm and aortic root noted to be 4.0-4.1 cm. Blood pressure well-controlled-maintain log Continue low-sodium diet Repeat CT angio chest aorta 5/25  PFO closure-closure with structural heart team on 12/04/2022.  Aspirin and Plavix was recommended for 6 months post closure.  However, aspirin and Plavix discontinued due to need for apixaban. Start aspirin and Plavix  Follows with structural heart team  History of CVA-TEE noted PFO.  Underwent successful PFO closure  12/04/2022.  Developed atrial fibrillation postoperatively.  Continues to be neurologically intact.   Stop apixaban Start aspirin and Plavix  Hyperlipidemia-LDL 44 on recent check.  Does note some leg weakness  intermittently. Reduce atorvastatin to 20 mg daily  Disposition: Follow-up with Dr. Allyson Sabal or me in 6 months.   Thomasene Ripple. Sharni Negron NP-C     04/07/2023, 10:33 AM [facility] [address] Office [phone] Fax [phone]    I spent 14 minutes examining this patient, reviewing medications, and using patient centered shared decision making involving her cardiac care.  Prior to her visit I spent greater than 20 minutes reviewing her past medical history,  medications, and prior cardiac tests.

## 2023-04-07 ENCOUNTER — Other Ambulatory Visit: Payer: Self-pay | Admitting: General Practice

## 2023-04-07 ENCOUNTER — Ambulatory Visit: Payer: Medicare Other | Attending: General Practice | Admitting: General Practice

## 2023-04-07 ENCOUNTER — Encounter: Payer: Self-pay | Admitting: General Practice

## 2023-04-07 VITALS — BP 130/68 | HR 54 | Ht 71.0 in | Wt 220.0 lb

## 2023-04-07 DIAGNOSIS — Q2543 Congenital aneurysm of aorta: Secondary | ICD-10-CM | POA: Diagnosis not present

## 2023-04-07 DIAGNOSIS — Z8673 Personal history of transient ischemic attack (TIA), and cerebral infarction without residual deficits: Secondary | ICD-10-CM | POA: Insufficient documentation

## 2023-04-07 DIAGNOSIS — I4891 Unspecified atrial fibrillation: Secondary | ICD-10-CM | POA: Insufficient documentation

## 2023-04-07 DIAGNOSIS — Q2112 Patent foramen ovale: Secondary | ICD-10-CM | POA: Diagnosis present

## 2023-04-07 MED ORDER — ASPIRIN 81 MG PO TBEC: 81.0000 mg | DELAYED_RELEASE_TABLET | Freq: Every day | ORAL | Status: AC

## 2023-04-07 MED ORDER — METOPROLOL SUCCINATE ER 25 MG PO TB24: 25.0000 mg | ORAL_TABLET | Freq: Every day | ORAL | 7 refills | Status: DC

## 2023-04-07 MED ORDER — ATORVASTATIN CALCIUM 20 MG PO TABS: 40.0000 mg | ORAL_TABLET | Freq: Every day | ORAL | 7 refills | Status: DC

## 2023-04-07 MED ORDER — CLOPIDOGREL BISULFATE 75 MG PO TABS: 75.0000 mg | ORAL_TABLET | Freq: Every day | ORAL | 7 refills | Status: DC

## 2023-04-07 NOTE — Patient Instructions (Addendum)
Medication Instructions:  STOP ELIQUIS STOP METOPROLOL TART.  START PLAVIX 75MG  DAILY START METOPROLOL SUCCINATE 12.5MG  DAILY DECREASE ATORVASTATIN 20MG  DAILY START ASPIRIN 81MG  DAILY *If you need a refill on your cardiac medications before your next appointment, please call your pharmacy*  Lab Work: NONE  Testing/Procedures: HAVE YOUR ECHO IN MAY 2025  Other Instructions INCREASE PHYSICAL ACTIVITY-GOAL IS 150 MINUTES OF MODERATE PHYSICAL ACTIVITY-AS TOLERATED.  Follow-Up: At [facility], you and your health needs are our priority.  As part of our continuing mission to provide you with exceptional heart care, we have created designated Provider Care Teams.  These Care Teams include your primary Cardiologist (physician) and Advanced Practice Providers (APPs -  Physician Assistants and Nurse Practitioners) who all work together to provide you with the care you need, when you need it.  Your next appointment:   6 month(s)  Provider:   Nanetta Batty, MD

## 2023-05-07 NOTE — Progress Notes (Unsigned)
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  LDL goal less than 70 Hgb U4Q goal less than 7 Scheduled for PFO closure Mediterranean diet Follow up 6 months. ***      Subjective:  Marvin Harvey is a 67 year old right-handed male with axending aortic aneurysm, sleep apnea and GERD s/p Nissen fundoplication, hiatal hernia who follows up for stroke.  History supplemented by his accompanying wife.  UPDATE: Current medications:  ASA 81mg  daily, Plavix 75mg   Underwent PFO closure on 7/12 and discharged on ASA and Plavix for 6 months.  On 7/22, he woke up with palpitations and later chest pain.  Presented to the ED where he was found to be in atrial fibrlllation.  Increased metoprolol and DAPT switched to Eliquis.    HISTORY: On 09/28/2022, he developed slurred speech and left hand/finger weakness.  He was admitted to [facility] in [place], [place].  To evaluate for radiculopathy, he had MR of C-spine which revealed cervical spondylosis moderate bilateral neural foraminal narrowing at C5-6 and minimal left neural foraminal narrowing at C6-7 but no central stenosis.  CT head showed age-indeterminate infarcts in the right cerebellum and left caudate nucleus but no acute intracranial abnormalities.  However, follow up MRI of brain revealed tiny acute or subacute cortical infarct at the posterior cortex of the right precentral gyrus/motor strip near the hand motor knob as well as the previously seen chronic infarcts in the right cerebellum.  CTA of head ane neck showed no LVO or hemodynamically significant stenosis.  2D echo revealed a PFO.  Lower extremity  doppler was negative for DVT.  LDL was 85.  Hgb A1c 5.3.  He was discharged on DAP for 21 days followed by ASA 81mg  daily thereafter.  Followed up with outpatient cardiology.  TEE on 10/26/2022 revealed EF 55-60% with again noted PFO.  He had a 2 week Zio patch monitor in [place].  Doesn't know results.  He does report occasional palpitations.    Strength in hand normal but has slight sensory changes.    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   1   aspirin EC 81 MG tablet Take 1 tablet (81 mg total) by mouth daily. Swallow whole.     atorvastatin (LIPITOR) 20 MG tablet TAKE 2 TABLETS BY MOUTH AT BEDTIME 180 tablet 3   clopidogrel (PLAVIX) 75 MG tablet Take 1 tablet (75 mg total) by mouth daily. 30 tablet 7   Coenzyme Q10 100 MG TABS Take 100 mg by mouth in the morning.     levocetirizine (XYZAL) 5 MG tablet Take 5 mg by mouth daily as needed for allergies.  LEXAPRO 10 MG tablet Take 10 mg by mouth in the morning.     metoprolol succinate (TOPROL-XL) 25 MG 24 hr tablet Take 1 tablet (25 mg total) by mouth daily. 30 tablet 7   triamcinolone (NASACORT ALLERGY 24HR) 55 MCG/ACT AERO nasal inhaler Place 1 spray into the nose daily as needed (congestion/allergies.).     No current facility-administered medications on file prior to visit.    ALLERGIES: No Known Allergies  FAMILY HISTORY: Family History  Problem Relation Age of Onset   Atrial fibrillation Mother    Transient ischemic attack Mother    Colon polyps Father     Coronary artery disease Father        s/p CABGx3, Valve replacement   Aortic aneurysm Father        repaired   Colon cancer Neg Hx    Esophageal cancer Neg Hx    Rectal cancer Neg Hx    Stomach cancer Neg Hx    Liver disease Neg Hx       Objective:  *** General: No acute distress.  Patient appears well-groomed.   Head:  Normocephalic/atraumatic Eyes:  Fundi examined but not visualized Heart:  Regular rate and rhythm *** Neurological Exam: alert and oriented.  Speech fluent and not dysarthric, language intact.  CN II-XII intact. Bulk and tone normal, muscle strength 5/5 throughout.  Sensation to light touch intact.  Deep tendon reflexes 2+ throughout, toes downgoing.  Finger to nose testing intact.  Gait normal, Romberg negative.   Shon Millet, DO  CC: Jonathon Bellows, DO

## 2023-05-10 ENCOUNTER — Encounter: Payer: Self-pay | Admitting: Neurology

## 2023-05-10 ENCOUNTER — Ambulatory Visit (INDEPENDENT_AMBULATORY_CARE_PROVIDER_SITE_OTHER): Payer: Medicare Other | Admitting: Neurology

## 2023-05-10 VITALS — BP 126/73 | HR 57 | Ht 71.0 in | Wt 220.2 lb

## 2023-05-10 DIAGNOSIS — I63411 Cerebral infarction due to embolism of right middle cerebral artery: Secondary | ICD-10-CM

## 2023-05-10 DIAGNOSIS — E782 Mixed hyperlipidemia: Secondary | ICD-10-CM | POA: Diagnosis not present

## 2023-05-10 DIAGNOSIS — Q2112 Patent foramen ovale: Secondary | ICD-10-CM | POA: Diagnosis not present

## 2023-05-14 ENCOUNTER — Telehealth: Payer: Self-pay | Admitting: Cardiovascular Disease

## 2023-05-14 MED ORDER — AMOXICILLIN 500 MG PO CAPS: 2000.0000 mg | ORAL_CAPSULE | Freq: Once | ORAL | 0 refills | Status: AC

## 2023-05-14 NOTE — Telephone Encounter (Signed)
Patient identification verified by 2 forms. Marilynn Rail, RN    Called and spoke to patient  Patient states:   -had dermatology procedure, excision of potential cancer lesion   -plans to schedule another procedure, the Mohs surgery  -unsure if needs to discontinue Plavix prior to procedure    -would like to know if abx are needed for this procedure due to recent PFO closure   -dermatology did not request clearance from cardiology  Informed patient message sent to provider for input  Patient verbalized understanding, no questions at this time

## 2023-05-14 NOTE — Telephone Encounter (Signed)
Patient calling to speak to the nurse about being on blood thinner and antibiotics. Please advise

## 2023-05-14 NOTE — Telephone Encounter (Signed)
Patient identification verified by 2 forms. Marilynn Rail, RN    Called and spoke to patient  Informed Rx for Amoxacillin sent to preferred pharmacy  Reviewed Rx instruction/education  Patient verbalized understanding, no questions at this time

## 2023-05-14 NOTE — Telephone Encounter (Signed)
Pavero, Cristal Deer, RPH  You16 minutes ago (4:17 PM)    Please send in amoxacillin 2000mg  to be taken 30-60 minutes before procedure

## 2023-05-26 HISTORY — PX: CATARACT EXTRACTION, BILATERAL: SHX1313

## 2023-07-13 ENCOUNTER — Other Ambulatory Visit (HOSPITAL_COMMUNITY): Payer: Self-pay

## 2023-07-14 ENCOUNTER — Other Ambulatory Visit (HOSPITAL_COMMUNITY): Payer: Self-pay

## 2023-07-14 ENCOUNTER — Telehealth: Payer: Self-pay | Admitting: Pharmacy Technician

## 2023-07-14 NOTE — Telephone Encounter (Signed)
Ran test claim for atorvastatin 20mg . For a 60/30ds day supply is too soon until 07/31/23 Ran test claim for atorvastatin 40mg . For a 30/30 day supply and the co-pay is 6.80 .  There was a fax saying atorvastatin needed pa but it does not  This test claim was processed through [facility]- copay amounts may vary at other pharmacies due to pharmacy/plan contracts, or as the patient moves through the different stages of their insurance plan.

## 2023-10-01 ENCOUNTER — Ambulatory Visit (HOSPITAL_BASED_OUTPATIENT_CLINIC_OR_DEPARTMENT_OTHER)
Admission: RE | Admit: 2023-10-01 | Discharge: 2023-10-01 | Disposition: A | Discharge status: Home / Self Care | Payer: Medicare Other | Source: Ambulatory Visit | Attending: General Practice | Admitting: General Practice

## 2023-10-01 DIAGNOSIS — Z8673 Personal history of transient ischemic attack (TIA), and cerebral infarction without residual deficits: Secondary | ICD-10-CM | POA: Diagnosis present

## 2023-10-01 DIAGNOSIS — Q2543 Congenital aneurysm of aorta: Secondary | ICD-10-CM | POA: Diagnosis present

## 2023-10-01 MED ORDER — IOHEXOL 350 MG/ML SOLN
100.0000 mL | Freq: Once | INTRAVENOUS | Status: AC | PRN
  Administered 2023-10-01: 100 mL via INTRAVENOUS

## 2023-10-05 ENCOUNTER — Ambulatory Visit: Payer: Self-pay

## 2023-10-07 ENCOUNTER — Ambulatory Visit

## 2023-11-05 NOTE — Progress Notes (Deleted)
 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  LDL goal less than 70 Hgb W0J goal less than 7 Mediterranean diet Follow up as needed      Subjective:  Marvin Harvey is a 68 year old right-handed male with axending aortic aneurysm, sleep apnea and GERD s/p Nissen fundoplication, hiatal hernia who follows up for stroke.  History supplemented by his accompanying wife.  UPDATE: Current medications:  ASA 81mg  daily, Plavix  75mg , atorvastatin , metoprolol  succinate  ***  HISTORY: On 09/28/2022, he developed slurred speech and left hand/finger weakness.  He was admitted to [facility] in [place], [place].  To evaluate for radiculopathy, he had MR of C-spine which revealed cervical spondylosis moderate bilateral neural foraminal narrowing at C5-6 and minimal left neural foraminal narrowing at C6-7 but no central stenosis.  CT head showed age-indeterminate infarcts in the right cerebellum and left caudate nucleus but no acute intracranial abnormalities.  However, follow up MRI of brain revealed tiny acute or subacute cortical infarct at the posterior cortex of the right precentral gyrus/motor strip near the hand motor knob as well as the previously seen chronic infarcts in the right cerebellum.  CTA of head ane neck showed no LVO or hemodynamically significant stenosis.  2D echo revealed a PFO.  Lower extremity doppler was negative for DVT.  LDL was 85.  Hgb A1c 5.3.  He was discharged on DAP for 21 days followed by ASA 81mg  daily thereafter.  Followed up with outpatient cardiology.  TEE on 10/26/2022 revealed EF  55-60% with again noted PFO.  He had a 2 week Zio patch monitor in [place].  Doesn't know results.  He does report occasional palpitations.  Underwent PFO closure on 12/04/2022 and discharged on ASA and Plavix  for 6 months.  On 12/14/2022, he woke up with palpitations and later chest pain.  Presented to the ED where he was found to be in atrial fibrlllation.  Increased metoprolol  and DAPT switched to Eliquis .  30 day event monitor was negative for a fib.  Eliquis  was subsequently stopped as it was believed that the a fib be reactionary to the PFO closure.    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   1   aspirin  EC 81 MG tablet Take 1 tablet (81 mg total) by mouth daily. Swallow whole.     atorvastatin  (LIPITOR ) 20 MG tablet TAKE 2 TABLETS BY MOUTH AT BEDTIME 180 tablet 3   clopidogrel  (PLAVIX ) 75 MG tablet Take 1 tablet (75 mg total) by mouth daily. 30 tablet 7   Coenzyme Q10 100 MG TABS Take 100 mg by mouth in the morning.  levocetirizine (XYZAL) 5 MG tablet Take 5 mg by mouth daily as needed for allergies.     LEXAPRO  10 MG tablet Take 10 mg by mouth in the morning.     metoprolol  succinate (TOPROL -XL) 25 MG 24 hr tablet Take 1 tablet (25 mg total) by mouth daily. 30 tablet 7   triamcinolone (NASACORT ALLERGY 24HR) 55 MCG/ACT AERO nasal inhaler Place 1 spray into the nose daily as needed (congestion/allergies.).     No current facility-administered medications on file prior to visit.    ALLERGIES: No Known  Allergies  FAMILY HISTORY: Family History  Problem Relation Age of Onset   Atrial fibrillation Mother    Transient ischemic attack Mother    Colon polyps Father    Coronary artery disease Father        s/p CABGx3, Valve replacement   Aortic aneurysm Father        repaired   Colon cancer Neg Hx    Esophageal cancer Neg Hx    Rectal cancer Neg Hx    Stomach cancer Neg Hx    Liver disease Neg Hx       Objective:  Blood pressure 126/73, pulse (!) 57, height 5' 11 (1.803 m), weight 220 lb 3.2 oz (99.9 kg), SpO2 99%. General: No acute distress.  Patient appears well-groomed.   Head:  Normocephalic/atraumatic Eyes:  Fundi examined but not visualized Heart:  Regular rate and rhythm  Neurological Exam: alert and oriented.  Speech fluent and not dysarthric, language intact.  CN II-XII intact. Bulk and tone normal, muscle strength 5/5 throughout.  Sensation to light touch intact.  Deep tendon reflexes 2+ throughout, toes downgoing.  Finger to nose testing intact.  Gait normal, Romberg negative.   Janne Members, DO  CC: Ava Lei, DO

## 2023-11-08 ENCOUNTER — Ambulatory Visit: Payer: Medicare Other | Admitting: Neurology

## 2023-12-02 ENCOUNTER — Ambulatory Visit
Admission: RE | Admit: 2023-12-02 | Discharge: 2023-12-02 | Disposition: A | Discharge status: Home / Self Care | Source: Ambulatory Visit | Attending: Internal Medicine | Admitting: Internal Medicine

## 2023-12-02 ENCOUNTER — Ambulatory Visit: Admitting: Physician Assistant

## 2023-12-02 ENCOUNTER — Ambulatory Visit: Payer: Self-pay | Admitting: Physician Assistant

## 2023-12-02 VITALS — BP 114/72 | HR 59 | Ht 71.0 in | Wt 210.4 lb

## 2023-12-02 DIAGNOSIS — I639 Cerebral infarction, unspecified: Secondary | ICD-10-CM | POA: Insufficient documentation

## 2023-12-02 DIAGNOSIS — Z8774 Personal history of (corrected) congenital malformations of heart and circulatory system: Secondary | ICD-10-CM | POA: Insufficient documentation

## 2023-12-02 DIAGNOSIS — I480 Paroxysmal atrial fibrillation: Secondary | ICD-10-CM | POA: Insufficient documentation

## 2023-12-02 DIAGNOSIS — I7121 Aneurysm of the ascending aorta, without rupture: Secondary | ICD-10-CM | POA: Diagnosis not present

## 2023-12-02 DIAGNOSIS — Q2543 Congenital aneurysm of aorta: Secondary | ICD-10-CM | POA: Diagnosis not present

## 2023-12-02 DIAGNOSIS — Q2112 Patent foramen ovale: Secondary | ICD-10-CM | POA: Insufficient documentation

## 2023-12-02 LAB — ECHOCARDIOGRAM LIMITED BUBBLE STUDY
Area-P 1/2: 3.03 cm2
P 1/2 time: 574 ms
S' Lateral: 2.8 cm

## 2023-12-02 MED ORDER — SODIUM CHLORIDE 0.9% FLUSH
32.0000 mL | INTRAVENOUS | Status: DC | PRN
Start: 2023-12-02 — End: 2023-12-03

## 2023-12-02 NOTE — Progress Notes (Signed)
 [facility]   MULTIDISCIPLINARY HEART VALVE CLINIC                                     Cardiology Office Note:    Date:  12/02/2023   ID:  Marvin Harvey, DOB 1956/03/19, MRN [id number]  PCP:  Lonna Millman, DO  [facility] Cardiologist:  Dorn Lesches, MD  [facility] Structural heart: Ozell Fell, MD [facility] Electrophysiologist:  None   Referring MD: Lonna Millman, DO   1 year s/p PFO closure   History of Present Illness:    Marvin Harvey is a 68 y.o. male with a hx of aortic root aneurysm 4.2 cm, TAA (family hx of repair), OSA on CPAP, CVA 09/2022, PFO closure 12/04/2022 and post procedure afib who presents to clinic for follow up.    He had a cryptogenic CVA in 09/2022. He underwent PFO closure with a 25mm Amplatz occluder on 12/04/2022 by Dr. Fell. He was subsequently admitted from 7/23-7/25/24 for new onset afib. He was started on Eliqiuis and DAPT was discontinued. Cardiac monitor 9/24-10/23/24 showed no recurrent of afib and transitioned off Eliquis  and back to DAPT to continue through 6 months PFO closure.    Today the patient presents to clinic for follow up. Here with his wife. No CP or SOB. No LE edema, orthopnea or PND. No dizziness or syncope. No blood in stool or urine. No palpitations. He did have some LE weakness and aching and cut atorvastatin  back. Has not been taking aspirin  or Plavix .    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    aspirin  EC 81 MG tablet Take 1 tablet (81 mg total) by mouth daily. Swallow whole.   atorvastatin  (LIPITOR ) 20 MG  tablet TAKE 2 TABLETS BY MOUTH AT BEDTIME (Patient taking differently: Take 40 mg by mouth at bedtime. 20mg )   Coenzyme Q10 100 MG TABS Take 100 mg by mouth in the morning.   levocetirizine (XYZAL) 5 MG tablet Take 5 mg by mouth daily as needed for allergies.   metoprolol  succinate (TOPROL -XL) 25 MG 24 hr tablet Take 1 tablet (25 mg total) by mouth daily.   triamcinolone (NASACORT ALLERGY 24HR) 55 MCG/ACT AERO nasal inhaler Place 1 spray into the nose daily as needed (congestion/allergies.).   [DISCONTINUED] clopidogrel  (PLAVIX ) 75 MG tablet Take 1 tablet (75 mg total) by mouth daily.      ROS:   Please see the history of present illness.    All other systems reviewed and are negative.  EKGs       Risk Assessment/Calculations:    CHA2DS2-VASc Score = 3   This indicates a 3.2% annual risk of stroke. The patient's score is based upon: CHF History: 0 HTN History: 0 Diabetes History: 0 Stroke History: 2 Vascular Disease History: 0 Age Score: 1 Gender Score: 0           Physical Exam:  VS:  BP 114/72   Pulse (!) 59   Ht 5' 11 (1.803 m)   Wt 210 lb 6.4 oz (95.4 kg)   SpO2 93%   BMI 29.34 kg/m     Wt Readings from Last 3 Encounters:  12/02/23 210 lb 6.4 oz (95.4 kg)  05/10/23 220 lb 3.2 oz (99.9 kg)  04/07/23 220 lb (99.8 kg)     GEN: Well nourished, well developed in no acute distress NECK: No JVD CARDIAC: RRR, no murmurs, rubs, gallops RESPIRATORY:  Clear to auscultation without rales, wheezing or rhonchi  ABDOMEN: Soft, non-tender, non-distended EXTREMITIES:  No edema; No deformity.   ASSESSMENT:    1. S/P percutaneous patent foramen ovale closure   2. Paroxysmal atrial fibrillation (HCC)   3. Aneurysm of ascending aorta without rupture (HCC)   4. Aortic root aneurysm   5. Cerebrovascular accident (CVA), unspecified mechanism (HCC)     PLAN:    In order of problems listed above:  PFO s/p PFO closure:  -- Echo with bubble today showed normal LV  function and negative bubble study. -- I have recommended that he restart taking a baby aspirin  81mg  daily. -- Continue regular follow up with Dr. Court.    PAF:  -- Felt to be related proceduraly related to PFO closure. -- Subsequent monitor with no recurrence of afib and taken off anticoagulation.  -- CHADS2Vasc score 3.  TAA: -- CT angio chest 10/21/2022 showed stable thoracic aortic aneurysm measuring 4.2 cm.  -- Family history of repair in father (in late 23s). -- BP well controlled. Continue Toprol  X 25mg  daily. -- Continue annual imaging   Aortic root dilation with mild AI:  -- AI remains stable on echo today.   Hx of CVA: -- Continue aspirin  81mg  daily  -- Continue atorvastatin  20mg  daily.    Medication Adjustments/Labs and Tests Ordered: Current medicines are reviewed at length with the patient today.  Concerns regarding medicines are outlined above.  No orders of the defined types were placed in this encounter.  No orders of the defined types were placed in this encounter.   Patient Instructions  Medication Instructions:  Your physician recommends that you continue on your current medications as directed. Please refer to the Current Medication list given to you today. *If you need a refill on your cardiac medications before your next appointment, please call your pharmacy*  Lab Work: None needed If you have labs (blood work) drawn today and your tests are completely normal, you will receive your results only by: MyChart Message (if you have MyChart) OR A paper copy in the mail If you have any lab test that is abnormal or we need to change your treatment, we will call you to review the results.  Testing/Procedures: None needed  Follow-Up: At [facility], you and your health needs are our priority.  As part of our continuing mission to provide you with exceptional heart care, our providers are all part of one team.  This team includes your primary  Cardiologist (physician) and Advanced Practice Providers or APPs (Physician Assistants and Nurse Practitioners) who all work together to provide you with the care you need, when you need it.  Your next appointment:   As scheduled: 05/01/24 at 9AM  Provider:   Dorn Court, MD    FOLLOW UP WITH STRUCTURAL HEART TEAM AS NEEDED  We recommend signing up for the patient portal called MyChart.  Sign up information is provided on this After Visit Summary.  MyChart is used to connect with patients for Virtual Visits (Telemedicine).  Patients are able to view lab/test results, encounter notes, upcoming appointments, etc.  Non-urgent messages can be sent to your provider as well.   To learn more about what you can do with MyChart, go to [email or web address].        Signed, Lamarr Hummer, PA-C  12/02/2023 8:13 PM    [facility]

## 2023-12-02 NOTE — Patient Instructions (Signed)
 Medication Instructions:  Your physician recommends that you continue on your current medications as directed. Please refer to the Current Medication list given to you today. *If you need a refill on your cardiac medications before your next appointment, please call your pharmacy*  Lab Work: None needed If you have labs (blood work) drawn today and your tests are completely normal, you will receive your results only by: MyChart Message (if you have MyChart) OR A paper copy in the mail If you have any lab test that is abnormal or we need to change your treatment, we will call you to review the results.  Testing/Procedures: None needed  Follow-Up: At [facility], you and your health needs are our priority.  As part of our continuing mission to provide you with exceptional heart care, our providers are all part of one team.  This team includes your primary Cardiologist (physician) and Advanced Practice Providers or APPs (Physician Assistants and Nurse Practitioners) who all work together to provide you with the care you need, when you need it.  Your next appointment:   As scheduled: 05/01/24 at 9AM  Provider:   Dorn Lesches, MD    FOLLOW UP WITH STRUCTURAL HEART TEAM AS NEEDED  We recommend signing up for the patient portal called MyChart.  Sign up information is provided on this After Visit Summary.  MyChart is used to connect with patients for Virtual Visits (Telemedicine).  Patients are able to view lab/test results, encounter notes, upcoming appointments, etc.  Non-urgent messages can be sent to your provider as well.   To learn more about what you can do with MyChart, go to [email or web address].

## 2023-12-03 ENCOUNTER — Other Ambulatory Visit (HOSPITAL_COMMUNITY): Payer: Medicare Other

## 2023-12-03 ENCOUNTER — Ambulatory Visit: Payer: Medicare Other

## 2023-12-08 ENCOUNTER — Other Ambulatory Visit: Payer: Self-pay | Admitting: General Practice

## 2024-03-21 ENCOUNTER — Ambulatory Visit: Admitting: Neurology

## 2024-04-07 NOTE — Progress Notes (Signed)
 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  LDL goal less than 70 Hgb J8r goal less than 7 Mediterranean diet Follow up as needed.       Subjective:  Marvin Harvey is a 68 year old right-handed male with ascending aortic aneurysm, sleep apnea and GERD s/p Nissen fundoplication, hiatal hernia who follows up for stroke.  History supplemented by his accompanying wife.  UPDATE: Current medications:  ASA 81mg  daily, atorvastatin  20mg  daily, metoprolol  succinate XL 25mg  daily  Last seen in December 2024.  He had subsequently stopped taking both ASA and Plavix .  Followed up with cardiology in July 2025 and advised to resume ASA 81mg .  TTE that month revealed EF 55-60% with negative bubble study.  Lipid panel revealed chol 91, TG 39, HDL 43, and LDL 40.    He sometimes has vague numbness in his left hand.  If he opens and closes his head, the numbness resolves.  No weakness or difficulty with dexterity.    HISTORY: On 09/28/2022, he developed slurred speech and left hand/finger weakness.  He was admitted to [facility] in [place], [place].  To evaluate for radiculopathy, he had MR of C-spine which revealed cervical spondylosis moderate bilateral neural foraminal narrowing at C5-6 and minimal left neural foraminal narrowing at C6-7 but no central stenosis.  CT head showed age-indeterminate infarcts in the right cerebellum and left caudate nucleus but no acute intracranial abnormalities.  However, follow up MRI of brain revealed tiny acute or subacute cortical infarct at the posterior cortex of the right precentral gyrus/motor strip near the hand  motor knob as well as the previously seen chronic infarcts in the right cerebellum.  CTA of head ane neck showed no LVO or hemodynamically significant stenosis.  2D echo revealed a PFO.  Lower extremity doppler was negative for DVT.  LDL was 85.  Hgb A1c 5.3.  He was discharged on DAP for 21 days followed by ASA 81mg  daily thereafter.  Followed up with outpatient cardiology.  TEE on 10/26/2022 revealed EF 55-60% with again noted PFO.  He had a 2 week Zio patch monitor in [place].  Doesn't know results.  He does report occasional palpitations.  Underwent PFO closure on 12/04/2022 and discharged on ASA and Plavix  for 6 months.  On 7/22, he woke up with palpitations and later chest pain.  Presented to the ED where he was found to be in atrial fibrlllation.  Increased metoprolol  and DAPT switched to Eliquis .  30 day event monitor was negative for a fib.  Eliquis  was subsequently stopped as it was believed that the a fib be reactionary to the PFO closure.      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   1   aspirin  EC 81 MG tablet Take 1 tablet (81 mg total) by mouth daily. Swallow whole.     atorvastatin  (LIPITOR ) 20 MG tablet TAKE 2 TABLETS BY MOUTH AT BEDTIME (Patient taking differently: Take 40 mg by mouth at bedtime. 20mg ) 180 tablet 3   Coenzyme Q10 100 MG TABS Take 100 mg by mouth in the morning.     levocetirizine (XYZAL) 5 MG tablet Take 5 mg by mouth daily as needed for allergies.     LEXAPRO  10 MG tablet Take 10 mg by mouth in the  morning. (Patient not taking: Reported on 12/02/2023)     metoprolol  succinate (TOPROL -XL) 25 MG 24 hr tablet TAKE 1 TABLET (25 MG TOTAL) BY MOUTH DAILY. 90 tablet 3   triamcinolone (NASACORT ALLERGY 24HR) 55 MCG/ACT AERO nasal inhaler Place 1 spray into the nose daily as needed (congestion/allergies.).     No current facility-administered medications on file prior to visit.    ALLERGIES: No Known Allergies  FAMILY HISTORY: Family History  Problem Relation Age of Onset   Atrial fibrillation Mother    Transient ischemic attack Mother    Colon polyps Father    Coronary artery disease Father        s/p CABGx3, Valve replacement   Aortic aneurysm Father        repaired   Colon cancer Neg Hx    Esophageal cancer Neg Hx    Rectal cancer Neg Hx    Stomach cancer Neg Hx    Liver disease Neg Hx       Objective:  Blood pressure 121/79, pulse 62, height 5' 11 (1.803 m), weight 216 lb (98 kg), SpO2 98%. General: No acute distress.  Patient appears well-groomed.   Head:  Normocephalic/atraumatic Neck:  Supple.  No paraspinal tenderness.  Full range of motion. Heart:  Regular rate and rhythm. Neuro:  Alert and oriented.  Speech fluent and not dysarthric.  Language intact.  CN II-XII intact.  Bulk and tone normal.  Muscle strength 5/5 throughout.  Sensation to pinprick and vibration intact.  Deep tendon reflexes 2+ throughout, toes downgoing.  Gait normal.  Romberg negative.    Juliene Dunnings, DO  CC: Vernell Brain, DO

## 2024-04-10 ENCOUNTER — Ambulatory Visit: Admitting: Neurology

## 2024-04-11 ENCOUNTER — Ambulatory Visit (INDEPENDENT_AMBULATORY_CARE_PROVIDER_SITE_OTHER): Admitting: Neurology

## 2024-04-11 ENCOUNTER — Encounter: Payer: Self-pay | Admitting: Neurology

## 2024-04-11 VITALS — BP 121/79 | HR 62 | Ht 71.0 in | Wt 216.0 lb

## 2024-04-11 DIAGNOSIS — I63411 Cerebral infarction due to embolism of right middle cerebral artery: Secondary | ICD-10-CM

## 2024-04-11 DIAGNOSIS — Q2112 Patent foramen ovale: Secondary | ICD-10-CM

## 2024-05-01 ENCOUNTER — Ambulatory Visit: Attending: Cardiovascular Disease | Admitting: Cardiovascular Disease

## 2024-05-01 ENCOUNTER — Encounter: Payer: Self-pay | Admitting: Cardiovascular Disease

## 2024-05-01 VITALS — BP 124/78 | HR 65 | Ht 71.0 in | Wt 216.0 lb

## 2024-05-01 DIAGNOSIS — Q2543 Congenital aneurysm of aorta: Secondary | ICD-10-CM | POA: Diagnosis not present

## 2024-05-01 DIAGNOSIS — I480 Paroxysmal atrial fibrillation: Secondary | ICD-10-CM

## 2024-05-01 DIAGNOSIS — E782 Mixed hyperlipidemia: Secondary | ICD-10-CM | POA: Diagnosis not present

## 2024-05-01 DIAGNOSIS — Q2112 Patent foramen ovale: Secondary | ICD-10-CM | POA: Diagnosis not present

## 2024-05-01 NOTE — Assessment & Plan Note (Signed)
 History of stroke in [place]  09/28/2022.  Transesophageal echo performed by Dr.Chandrasekhar 10/26/2022 showed a PFO.  He had successful PFO closure by Dr. Wonda 12/04/2022.  His most recent 2D echo performed 12/02/2023 showed normal LV systolic function with a negative bubble study suggesting no evidence of PFO with a well-functioning Amplatzer occluder.

## 2024-05-01 NOTE — Assessment & Plan Note (Signed)
 Brief episode of PAF without recurrence.  He was on Eliquis  briefly but now is on a baby aspirin .

## 2024-05-01 NOTE — Assessment & Plan Note (Signed)
 History of hyperlipidemia on atorvastatin  every other day lipid profile performed 12/15/2022 revealing total cholesterol 91, LDL 40 and HDL 43.  He does have a history of normal coronaries by cath in 2019 by myself.  His LDL goal should be less than 100.  And I stopped his statin and we will recheck a lipid liver profile in 3 months.

## 2024-05-01 NOTE — Progress Notes (Signed)
 05/01/2024 Marvin Harvey   1955/06/20  [id number]  Primary Physician Lonna Millman, DO Primary Cardiologist: Dorn JINNY Lesches MD [facility], [place], [place]  HPI:  Marvin Harvey is a 68 y.o.   mild to moderately overweight married Caucasian male father of 1, grandfather of 2 grandchildren who is a clinical research associate and  who I last saw in the office 02/10/2023.  He was being seen for his first post hospital outpatient follow-up after being admitted 01/27/2018 in transfer from [facility] for chest pain.  I done a cardiac catheterization on him 01/26/2018 revealing normal coronary arteries.  He was seen by cardiologist there and is had his aortic valve followed by echo periodically.  He recently had a CTA that showed a 5.2 cm aortic aneurysm.  His father apparently also had a thoracic aortic aneurysm and had this repaired along with an AVR in his 69s.  He has no other cardiac risk factors.   He is followed by Dr. 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    He does have obstructive sleep apnea on CPAP and has been seen by Dr. Shlomo to explore the inspire device however his sleep study was indeterminant.   He unfortunately unfortunately suffered a stroke in [place]  09/28/2022, discharged home the following day.  Workup there was unrevealing including CT of the head, CTA of the neck.  He did wear a 2-week Zio patch however the results are unavailable to me.  He underwent transesophageal echocardiography by Dr.Chandrasekhar 10/26/2022 which showed a PFO and scheduled to see Dr. Wonda in the office on 11/12/2022 for gets consideration of PFO closure.   He ultimately underwent PFO closure by Dr. Wonda 12/04/2022 successfully.  He did have a brief PAF several weeks later which resolved spontaneously and was placed on Eliquis .  Since I saw him a year ago he remained stable.  He denies chest pain or shortness of breath.  He  has been followed up in the structural heart clinic.  His clopidogrel  was discontinued.  Most recent echo performed 12/02/2023 revealed normal LV systolic function with a negative bubble study suggesting well-functioning Amplatzer PFO occluder.   Current Meds  Medication Sig   allopurinol  (ZYLOPRIM ) 300 MG tablet Take 450 mg by mouth every evening.    Ascorbic Acid (VITAMIN C) 1000 MG tablet Take 1,000 mg by mouth daily.   aspirin  EC 81 MG tablet Take 1 tablet (81 mg total) by mouth daily. Swallow whole.   Cholecalciferol 1.25 MG (50000 UT) capsule (50000 UNIT)   Coenzyme Q10 100 MG TABS Take 100 mg by mouth in the morning.   levocetirizine (XYZAL) 5 MG tablet Take 5 mg by mouth daily as needed for allergies.   Magnesium Oxide (MAGOX 400 PO) Take by mouth.   metoprolol  succinate (TOPROL -XL) 25 MG 24 hr tablet TAKE 1 TABLET (25 MG TOTAL) BY MOUTH DAILY.   prednisoLONE acetate (PRED FORTE) 1 % ophthalmic suspension Place 1 drop into both eyes daily at 6 (six) AM.   triamcinolone (NASACORT ALLERGY 24HR) 55 MCG/ACT AERO nasal inhaler Place 1 spray into the nose daily as needed (congestion/allergies.).   [DISCONTINUED] atorvastatin  (LIPITOR ) 20 MG tablet TAKE 2 TABLETS BY MOUTH AT BEDTIME (Patient taking differently: Take 40 mg by mouth at bedtime. 20mg )     No Known Allergies  Social History   Socioeconomic History   Marital status: Married    Spouse  name: Not on file   Number of children: Not on file   Years of education: Not on file   Highest education level: Not on file  Occupational History   Not on file  Tobacco Use   Smoking status: Never   Smokeless tobacco: Never  Vaping Use   Vaping status: Never Used  Substance and Sexual Activity   Alcohol use: Never   Drug use: Never   Sexual activity: Not on file  Other Topics Concern   Not on file  Social History Narrative   Are you right handed or left handed? Right   Are you currently employed ? Yes   What is your current  occupation? [employer]   Do you live at home alone? Yes   Who lives with you?  Wife   What type of home do you live in: 1 story or 2 story? 2       Social Drivers of Corporate Investment Banker Strain: Not on file  Food Insecurity: No Food Insecurity (12/15/2022)   Hunger Vital Sign    Worried About Running Out of Food in the Last Year: Never true    Ran Out of Food in the Last Year: Never true  Transportation Needs: No Transportation Needs (12/15/2022)   PRAPARE - Administrator, Civil Service (Medical): No    Lack of Transportation (Non-Medical): No  Physical Activity: Not on file  Stress: Not on file  Social Connections: Not on file  Intimate Partner Violence: Not At Risk (12/15/2022)   Humiliation, Afraid, Rape, and Kick questionnaire    Fear of Current or Ex-Partner: No    Emotionally Abused: No    Physically Abused: No    Sexually Abused: No     Review of Systems: General: negative for chills, fever, night sweats or weight changes.  Cardiovascular: negative for chest pain, dyspnea on exertion, edema, orthopnea, palpitations, paroxysmal nocturnal dyspnea or shortness of breath Dermatological: negative for rash Respiratory: negative for cough or wheezing Urologic: negative for hematuria Abdominal: negative for nausea, vomiting, diarrhea, bright red blood per rectum, melena, or hematemesis Neurologic: negative for visual changes, syncope, or dizziness All other systems reviewed and are otherwise negative except as noted above.    Blood pressure 124/78, pulse 65, height 5' 11 (1.803 m), weight 216 lb (98 kg), SpO2 98%.  General appearance: alert and no distress Neck: no adenopathy, no carotid bruit, no JVD, supple, symmetrical, trachea midline, and thyroid not enlarged, symmetric, no tenderness/mass/nodules Lungs: clear to auscultation bilaterally Heart: regular rate and rhythm, S1, S2 normal, no murmur, click, rub or gallop Extremities: extremities normal,  atraumatic, no cyanosis or edema Pulses: 2+ and symmetric Skin: Skin color, texture, turgor normal. No rashes or lesions Neurologic: Grossly normal  EKG EKG Interpretation Date/Time:  Monday May 01 2024 09:27:07 EST Ventricular Rate:  65 PR Interval:  198 QRS Duration:  82 QT Interval:  396 QTC Calculation: 411 R Axis:   -15  Text Interpretation: Normal sinus rhythm Normal ECG When compared with ECG of 29-Dec-2022 14:25, PR interval has decreased Inverted T waves have replaced nonspecific T wave abnormality in Inferior leads Confirmed by Court Carrier [phone]) on 05/01/2024 9:43:01 AM    ASSESSMENT AND PLAN:   Aortic root aneurysm (HCC) Mild dilatation of the ascending thoracic aorta measuring 4.3 cm by 2D echo and 4.2 cm by CTA.  We will recheck this on an annual basis.  Atrial fibrillation (HCC) Brief episode of PAF without recurrence.  He was on Eliquis  briefly but now is on a baby aspirin .  Mixed hyperlipidemia History of hyperlipidemia on atorvastatin  every other day lipid profile performed 12/15/2022 revealing total cholesterol 91, LDL 40 and HDL 43.  He does have a history of normal coronaries by cath in 2019 by myself.  His LDL goal should be less than 100.  And I stopped his statin and we will recheck a lipid liver profile in 3 months.  PFO (patent foramen ovale) History of stroke in [place]  09/28/2022.  Transesophageal echo performed by Dr.Chandrasekhar 10/26/2022 showed a PFO.  He had successful PFO closure by Dr. Wonda 12/04/2022.  His most recent 2D echo performed 12/02/2023 showed normal LV systolic function with a negative bubble study suggesting no evidence of PFO with a well-functioning Amplatzer occluder.     Dorn DOROTHA Lesches MD FACP,FACC,FAHA, [facility] 05/01/2024 9:54 AM

## 2024-05-01 NOTE — Patient Instructions (Signed)
 Medication Instructions:  Your physician has recommended you make the following change in your medication:   -Stop taking atorvastatin  (lipitor ).  *If you need a refill on your cardiac medications before your next appointment, please call your pharmacy*  Lab Work: Your physician recommends that you return for lab work in: 3 months for FASTING Lipid/liver panel  If you have labs (blood work) drawn today and your tests are completely normal, you will receive your results only by: MyChart Message (if you have MyChart) OR A paper copy in the mail If you have any lab test that is abnormal or we need to change your treatment, we will call you to review the results.  Testing/Procedures: Your physician has requested that you have an echocardiogram. Echocardiography is a painless test that uses sound waves to create images of your heart. It provides your doctor with information about the size and shape of your heart and how well your heart's chambers and valves are working. This procedure takes approximately one hour. There are no restrictions for this procedure. Please do NOT wear cologne, perfume, aftershave, or lotions (deodorant is allowed). Please arrive 15 minutes prior to your appointment time.  Please note: We ask at that you not bring children with you during ultrasound (echo/ vascular) testing. Due to room size and safety concerns, children are not allowed in the ultrasound rooms during exams. Our front office staff cannot provide observation of children in our lobby area while testing is being conducted. An adult accompanying a patient to their appointment will only be allowed in the ultrasound room at the discretion of the ultrasound technician under special circumstances. We apologize for any inconvenience.  **To do in July** '  Follow-Up: At [facility], you and your health needs are our priority.  As part of our continuing mission to provide you with exceptional heart care,  our providers are all part of one team.  This team includes your primary Cardiologist (physician) and Advanced Practice Providers or APPs (Physician Assistants and Nurse Practitioners) who all work together to provide you with the care you need, when you need it.  Your next appointment:   12 month(s)  Provider:   Dorn Lesches, MD

## 2024-05-01 NOTE — Assessment & Plan Note (Signed)
 Mild dilatation of the ascending thoracic aorta measuring 4.3 cm by 2D echo and 4.2 cm by CTA.  We will recheck this on an annual basis.
